# Patient Record
Sex: Female | Born: 1985 | Race: Black or African American | Hispanic: No | Marital: Married | Smoking: Current every day smoker
Health system: Southern US, Community
[De-identification: ages and names within clinical notes are randomized; demographics above are authoritative.]

## PROBLEM LIST (undated history)

## (undated) ENCOUNTER — Inpatient Hospital Stay (HOSPITAL_COMMUNITY): Payer: Self-pay

## (undated) DIAGNOSIS — E119 Type 2 diabetes mellitus without complications: Secondary | ICD-10-CM

## (undated) DIAGNOSIS — Z87898 Personal history of other specified conditions: Secondary | ICD-10-CM

## (undated) DIAGNOSIS — Z8742 Personal history of other diseases of the female genital tract: Secondary | ICD-10-CM

## (undated) DIAGNOSIS — Z8759 Personal history of other complications of pregnancy, childbirth and the puerperium: Secondary | ICD-10-CM

## (undated) DIAGNOSIS — G5603 Carpal tunnel syndrome, bilateral upper limbs: Secondary | ICD-10-CM

---
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
## 2005-12-22 ENCOUNTER — Inpatient Hospital Stay (HOSPITAL_COMMUNITY): Admission: AD | Admit: 2005-12-22 | Discharge: 2005-12-23 | Payer: Self-pay | Admitting: Obstetrics & Gynecology

## 2006-01-12 ENCOUNTER — Inpatient Hospital Stay (HOSPITAL_COMMUNITY): Admission: AD | Admit: 2006-01-12 | Discharge: 2006-01-12 | Payer: Self-pay | Admitting: Obstetrics and Gynecology

## 2006-01-13 ENCOUNTER — Inpatient Hospital Stay (HOSPITAL_COMMUNITY): Admission: AD | Admit: 2006-01-13 | Discharge: 2006-01-15 | Payer: Self-pay | Admitting: Gynecology

## 2006-01-13 ENCOUNTER — Ambulatory Visit: Payer: Self-pay | Admitting: *Deleted

## 2006-01-25 ENCOUNTER — Ambulatory Visit: Payer: Self-pay | Admitting: Family Medicine

## 2006-01-26 ENCOUNTER — Ambulatory Visit (HOSPITAL_COMMUNITY): Admission: RE | Admit: 2006-01-26 | Discharge: 2006-01-26 | Payer: Self-pay | Admitting: *Deleted

## 2006-04-01 ENCOUNTER — Inpatient Hospital Stay (HOSPITAL_COMMUNITY): Admission: AD | Admit: 2006-04-01 | Discharge: 2006-04-01 | Payer: Self-pay | Admitting: Obstetrics & Gynecology

## 2006-04-01 ENCOUNTER — Ambulatory Visit: Payer: Self-pay | Admitting: Family Medicine

## 2006-04-08 ENCOUNTER — Ambulatory Visit: Payer: Self-pay | Admitting: Family Medicine

## 2006-04-12 ENCOUNTER — Observation Stay (HOSPITAL_COMMUNITY): Admission: AD | Admit: 2006-04-12 | Discharge: 2006-04-12 | Payer: Self-pay | Admitting: Gynecology

## 2006-04-12 ENCOUNTER — Ambulatory Visit: Payer: Self-pay | Admitting: Obstetrics & Gynecology

## 2006-04-14 ENCOUNTER — Inpatient Hospital Stay (HOSPITAL_COMMUNITY): Admission: AD | Admit: 2006-04-14 | Discharge: 2006-04-16 | Payer: Self-pay | Admitting: Gynecology

## 2006-04-14 ENCOUNTER — Ambulatory Visit: Payer: Self-pay | Admitting: Certified Nurse Midwife

## 2006-04-19 ENCOUNTER — Ambulatory Visit: Payer: Self-pay | Admitting: Gynecology

## 2006-04-23 ENCOUNTER — Inpatient Hospital Stay (HOSPITAL_COMMUNITY): Admission: AD | Admit: 2006-04-23 | Discharge: 2006-04-24 | Payer: Self-pay | Admitting: Family Medicine

## 2006-04-23 ENCOUNTER — Ambulatory Visit: Payer: Self-pay | Admitting: *Deleted

## 2006-04-26 ENCOUNTER — Ambulatory Visit: Payer: Self-pay | Admitting: Obstetrics & Gynecology

## 2006-05-03 ENCOUNTER — Ambulatory Visit: Payer: Self-pay | Admitting: Gynecology

## 2006-05-03 ENCOUNTER — Ambulatory Visit (HOSPITAL_COMMUNITY): Admission: RE | Admit: 2006-05-03 | Discharge: 2006-05-03 | Payer: Self-pay | Admitting: Family Medicine

## 2006-05-17 ENCOUNTER — Ambulatory Visit: Payer: Self-pay | Admitting: Obstetrics & Gynecology

## 2006-05-21 ENCOUNTER — Ambulatory Visit: Payer: Self-pay | Admitting: Obstetrics & Gynecology

## 2006-05-24 ENCOUNTER — Ambulatory Visit (HOSPITAL_COMMUNITY): Admission: RE | Admit: 2006-05-24 | Discharge: 2006-05-24 | Payer: Self-pay | Admitting: Obstetrics and Gynecology

## 2006-05-24 ENCOUNTER — Ambulatory Visit: Payer: Self-pay | Admitting: Gynecology

## 2006-05-25 ENCOUNTER — Inpatient Hospital Stay (HOSPITAL_COMMUNITY): Admission: RE | Admit: 2006-05-25 | Discharge: 2006-05-29 | Payer: Self-pay | Admitting: Gynecology

## 2006-05-25 ENCOUNTER — Ambulatory Visit: Payer: Self-pay | Admitting: Gynecology

## 2006-06-04 ENCOUNTER — Ambulatory Visit: Payer: Self-pay | Admitting: Family Medicine

## 2006-06-04 ENCOUNTER — Inpatient Hospital Stay (HOSPITAL_COMMUNITY): Admission: AD | Admit: 2006-06-04 | Discharge: 2006-06-04 | Payer: Self-pay | Admitting: Obstetrics & Gynecology

## 2007-07-28 ENCOUNTER — Other Ambulatory Visit: Payer: Self-pay

## 2007-07-28 ENCOUNTER — Inpatient Hospital Stay (HOSPITAL_COMMUNITY): Admission: AD | Admit: 2007-07-28 | Discharge: 2007-07-31 | Payer: Self-pay | Admitting: *Deleted

## 2007-07-28 ENCOUNTER — Ambulatory Visit: Payer: Self-pay | Admitting: *Deleted

## 2007-11-10 HISTORY — PX: WISDOM TOOTH EXTRACTION: SHX21

## 2007-12-05 ENCOUNTER — Ambulatory Visit: Payer: Self-pay | Admitting: Family Medicine

## 2007-12-22 ENCOUNTER — Ambulatory Visit: Payer: Self-pay | Admitting: Internal Medicine

## 2007-12-22 LAB — CONVERTED CEMR LAB
ALT: 20 units/L (ref 0–35)
Alkaline Phosphatase: 57 units/L (ref 39–117)
Basophils Absolute: 0 10*3/uL (ref 0.0–0.1)
Cholesterol: 195 mg/dL (ref 0–200)
Creatinine, Ser: 0.75 mg/dL (ref 0.40–1.20)
Eosinophils Absolute: 0.1 10*3/uL (ref 0.0–0.7)
Eosinophils Relative: 2 % (ref 0–5)
HCT: 40 % (ref 36.0–46.0)
Hemoglobin: 13.1 g/dL (ref 12.0–15.0)
LDL Cholesterol: 116 mg/dL — ABNORMAL HIGH (ref 0–99)
MCHC: 32.8 g/dL (ref 30.0–36.0)
MCV: 82 fL (ref 78.0–100.0)
Monocytes Absolute: 0.5 10*3/uL (ref 0.1–1.0)
Platelets: 255 10*3/uL (ref 150–400)
RDW: 14.3 % (ref 11.5–15.5)
Sodium: 139 meq/L (ref 135–145)
Total Bilirubin: 0.4 mg/dL (ref 0.3–1.2)
Total CHOL/HDL Ratio: 3.4
Total Protein: 7.6 g/dL (ref 6.0–8.3)
Triglycerides: 111 mg/dL (ref <150)
VLDL: 22 mg/dL (ref 0–40)

## 2008-02-01 ENCOUNTER — Encounter: Payer: Self-pay | Admitting: Family Medicine

## 2008-02-01 ENCOUNTER — Ambulatory Visit: Payer: Self-pay | Admitting: Internal Medicine

## 2008-02-01 LAB — CONVERTED CEMR LAB: Chlamydia, DNA Probe: NEGATIVE

## 2009-08-26 ENCOUNTER — Encounter (INDEPENDENT_AMBULATORY_CARE_PROVIDER_SITE_OTHER): Payer: Self-pay | Admitting: Family Medicine

## 2009-08-26 ENCOUNTER — Ambulatory Visit: Payer: Self-pay | Admitting: Internal Medicine

## 2009-08-26 LAB — CONVERTED CEMR LAB
Alkaline Phosphatase: 84 units/L (ref 39–117)
BUN: 11 mg/dL (ref 6–23)
Basophils Absolute: 0 10*3/uL (ref 0.0–0.1)
Basophils Relative: 0 % (ref 0–1)
CO2: 23 meq/L (ref 19–32)
Cholesterol: 249 mg/dL — ABNORMAL HIGH (ref 0–200)
Creatinine, Ser: 0.87 mg/dL (ref 0.40–1.20)
Eosinophils Absolute: 0.1 10*3/uL (ref 0.0–0.7)
Eosinophils Relative: 1 % (ref 0–5)
Glucose, Bld: 345 mg/dL — ABNORMAL HIGH (ref 70–99)
HDL: 47 mg/dL (ref >39)
Hemoglobin: 12.7 g/dL (ref 12.0–15.0)
LDL Cholesterol: 172 mg/dL — ABNORMAL HIGH (ref 0–99)
MCHC: 31.4 g/dL (ref 30.0–36.0)
MCV: 81 fL (ref 78.0–100.0)
Monocytes Absolute: 0.5 10*3/uL (ref 0.1–1.0)
Monocytes Relative: 8 % (ref 3–12)
Neutro Abs: 3.2 10*3/uL (ref 1.7–7.7)
RBC: 4.99 M/uL (ref 3.87–5.11)
RDW: 14.1 % (ref 11.5–15.5)
Sodium: 138 meq/L (ref 135–145)
Total Bilirubin: 0.2 mg/dL — ABNORMAL LOW (ref 0.3–1.2)
Total CHOL/HDL Ratio: 5.3
Total Protein: 7.6 g/dL (ref 6.0–8.3)
Triglycerides: 149 mg/dL (ref <150)
VLDL: 30 mg/dL (ref 0–40)

## 2009-09-09 ENCOUNTER — Ambulatory Visit: Payer: Self-pay | Admitting: Internal Medicine

## 2009-09-16 ENCOUNTER — Ambulatory Visit: Payer: Self-pay | Admitting: Family Medicine

## 2009-10-19 ENCOUNTER — Emergency Department (HOSPITAL_COMMUNITY): Admission: EM | Admit: 2009-10-19 | Discharge: 2009-10-19 | Payer: Self-pay | Admitting: Emergency Medicine

## 2009-12-02 ENCOUNTER — Ambulatory Visit: Payer: Self-pay | Admitting: Internal Medicine

## 2009-12-30 ENCOUNTER — Ambulatory Visit: Payer: Self-pay | Admitting: Internal Medicine

## 2010-01-14 ENCOUNTER — Ambulatory Visit: Payer: Self-pay | Admitting: Internal Medicine

## 2010-01-14 LAB — CONVERTED CEMR LAB
BUN: 11 mg/dL (ref 6–23)
CO2: 23 meq/L (ref 19–32)
CRP: 1.3 mg/dL — ABNORMAL HIGH (ref <0.6)
Calcium: 8.6 mg/dL (ref 8.4–10.5)
Cholesterol: 237 mg/dL — ABNORMAL HIGH (ref 0–200)
Glucose, Bld: 278 mg/dL — ABNORMAL HIGH (ref 70–99)
Hgb A1c MFr Bld: 13.9 % — ABNORMAL HIGH (ref 4.6–6.1)
Sodium: 137 meq/L (ref 135–145)
TSH: 2.123 microintl units/mL (ref 0.350–4.500)
Total CHOL/HDL Ratio: 5.4

## 2010-01-20 ENCOUNTER — Ambulatory Visit: Payer: Self-pay | Admitting: Internal Medicine

## 2010-08-10 ENCOUNTER — Emergency Department (HOSPITAL_COMMUNITY): Admission: EM | Admit: 2010-08-10 | Discharge: 2010-08-10 | Payer: Self-pay | Admitting: Emergency Medicine

## 2010-09-16 ENCOUNTER — Emergency Department (HOSPITAL_COMMUNITY): Admission: EM | Admit: 2010-09-16 | Discharge: 2010-09-16 | Payer: Self-pay | Admitting: Emergency Medicine

## 2010-11-30 ENCOUNTER — Encounter: Payer: Self-pay | Admitting: *Deleted

## 2011-01-22 LAB — DIFFERENTIAL
Basophils Relative: 0 % (ref 0–1)
Eosinophils Absolute: 0.1 10*3/uL (ref 0.0–0.7)
Lymphs Abs: 2.3 10*3/uL (ref 0.7–4.0)
Neutro Abs: 5.2 10*3/uL (ref 1.7–7.7)
Neutrophils Relative %: 65 % (ref 43–77)

## 2011-01-22 LAB — POCT I-STAT, CHEM 8
HCT: 41 % (ref 36.0–46.0)
Hemoglobin: 13.9 g/dL (ref 12.0–15.0)
Potassium: 3.5 mEq/L (ref 3.5–5.1)
Sodium: 139 mEq/L (ref 135–145)
TCO2: 23 mmol/L (ref 0–100)

## 2011-01-22 LAB — D-DIMER, QUANTITATIVE: D-Dimer, Quant: 0.6 ug/mL-FEU — ABNORMAL HIGH (ref 0.00–0.48)

## 2011-01-22 LAB — CBC
MCH: 27.2 pg (ref 26.0–34.0)
MCHC: 33 g/dL (ref 30.0–36.0)
Platelets: 255 10*3/uL (ref 150–400)
RBC: 4.57 MIL/uL (ref 3.87–5.11)

## 2011-02-10 LAB — URINALYSIS, ROUTINE W REFLEX MICROSCOPIC
Bilirubin Urine: NEGATIVE
Hgb urine dipstick: NEGATIVE
Ketones, ur: NEGATIVE mg/dL
Protein, ur: NEGATIVE mg/dL
Specific Gravity, Urine: 1.038 — ABNORMAL HIGH (ref 1.005–1.030)
Urobilinogen, UA: 0.2 mg/dL (ref 0.0–1.0)

## 2011-02-10 LAB — BASIC METABOLIC PANEL
BUN: 12 mg/dL (ref 6–23)
Calcium: 9.1 mg/dL (ref 8.4–10.5)
Creatinine, Ser: 0.73 mg/dL (ref 0.4–1.2)
GFR calc non Af Amer: >60 mL/min (ref >60)
Glucose, Bld: 382 mg/dL — ABNORMAL HIGH (ref 70–99)

## 2011-02-10 LAB — GLUCOSE, CAPILLARY: Glucose-Capillary: 375 mg/dL — ABNORMAL HIGH (ref 70–99)

## 2011-02-10 LAB — URINE MICROSCOPIC-ADD ON

## 2011-03-17 ENCOUNTER — Emergency Department (HOSPITAL_COMMUNITY)
Admission: EM | Admit: 2011-03-17 | Discharge: 2011-03-17 | Discharge status: Against Medical Advice | Payer: Medicaid Other | Attending: Emergency Medicine | Admitting: Emergency Medicine

## 2011-03-17 DIAGNOSIS — Z794 Long term (current) use of insulin: Secondary | ICD-10-CM | POA: Insufficient documentation

## 2011-03-17 DIAGNOSIS — R112 Nausea with vomiting, unspecified: Secondary | ICD-10-CM | POA: Insufficient documentation

## 2011-03-17 DIAGNOSIS — R5381 Other malaise: Secondary | ICD-10-CM | POA: Insufficient documentation

## 2011-03-17 DIAGNOSIS — Z79899 Other long term (current) drug therapy: Secondary | ICD-10-CM | POA: Insufficient documentation

## 2011-03-17 DIAGNOSIS — E1169 Type 2 diabetes mellitus with other specified complication: Secondary | ICD-10-CM | POA: Insufficient documentation
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
## 2011-03-27 NOTE — Discharge Summary (Signed)
NAME:  Kathryn Mayo, MCCONAUGHEY NO.:  [id number]   MEDICAL RECORD NO.:  [id number]           PATIENT TYPE:   LOCATION:                                 FACILITY:   PHYSICIAN:  Ginger Carne, MD       DATE OF BIRTH:   DATE OF ADMISSION:  05/25/2006  DATE OF DISCHARGE:  05/29/2006                                 DISCHARGE SUMMARY   ADMISSION DIAGNOSES:  1.  Intrauterine pregnancy at term.  2.  Gestational diabetes requiring insulin.  3.  Desires repeat C-section.   DISCHARGE DIAGNOSES:  1.  Delivered pregnancy at term.  The C-section was done by Dr. Mayford Knife on      July 18 with Dr. Mia Creek.  The patient received spinal anesthesia, had      1000 mL blood loss.  The patient had a viable female, Apgars 8 and 9, 8      pounds 9 ounces.  2.  Gestational diabetes and diabetes type 2.  3.  History of normal Pap.   HOSPITAL COURSE:  The patient is a 25 year old female who was admitted in  labor on May 25, 2006.  The patient did have a deceleration for 3 minutes  with heart rate at 80.  However, the strip became quickly reassuring in less  than 3 minutes.  The patient was taken to the OR for a repeat C-section on  July 18 with Dr. Mia Creek and Dr. Mayford Knife as above.  Estimated blood loss  was 1000 mL.   On postoperative day 1, she began doing well.  Her blood sugars were 183 and  104.  Her blood sugars continued to remain in the less than 200 range and at  discharge her last blood sugar was 112.  Here in the hospital, she was kept  on glyburide 5 mg p.o. b.i.d. but at discharge given concern that the  patient intermittently takes her medication, it was decided that metformin  would be safer for her.  This MD sat down and talked with her extensively  about her diabetic regimen.  She will continue taking NovoLog 10 units  q.a.c. t.i.d. but then will switch her to metformin 500 mg p.o. for a week  and then 1000 mg p.o. at bedtime for another week.  Side effects were  explained including diarrhea.  The patient is going to follow up with Dr.  Alanda Amass at the [facility] for this problem.  On day 3,  the patient was doing well.  Her incision was clean, dry and intact.  Her  exam was within normal limits.  She expressed understanding of her  medication regimen.   DISCHARGE MEDICATIONS:  1.  Percocet 1-2 tablets p.o. q.4 p.r.n.  2.  Ibuprofen 600 mg q.6 p.r.n.  3.  Colace 100 mg p.o. b.i.d. as needed for constipation.  4.  Prenatal vitamins one p.o. daily.  5.  NovoLog 10 units q.a.c. t.i.d.  This is a new dose.  6.  Metformin 500 mg at night for one week and then 1000 mg at night from  then on.  7.  Stop your NPH.   FOLLOW-UP CARE:  The patient will return to [facility] in six weeks.  She also has an appointment on August 2 at 1:30 p.m. with Dr. Melynda Ripple at  [facility].  She will return to [facility]U on Monday, July 23 for staple  removal.  MD discussed these appointments with the patient as the patient is  aware of these dates.   DISCHARGE LABORATORY DATA:  The patient received RhoGAM on July 19.  Her CBC  on July 19 white blood cells 7.2, H&H 10.9/32.3, platelets 240.   The patient was stable during her entire hospital stay and was discharged in  improved condition.  The patient was a full code for entire hospital stay.      Rolm Gala, M.D.      Ginger Carne, MD  Electronically Signed    HG/MEDQ  D:  05/29/2006  T:  05/29/2006  Job:  [id number]
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
## 2011-03-27 NOTE — Op Note (Signed)
NAME:  Kathryn Mayo, Kathryn Mayo NO.:  [id number]   MEDICAL RECORD NO.:  [id number]          PATIENT TYPE:  INP   LOCATION:  WOC                           FACILITY:  [facility]   PHYSICIAN:  Ginger Carne, MD  DATE OF BIRTH:  09-06-1986   DATE OF PROCEDURE:  05/26/2006  DATE OF DISCHARGE:                                 OPERATIVE REPORT   PREOPERATIVE DIAGNOSIS:  1.  36-week intrauterine pregnancy.  2.  Gestational diabetes, A2/B.  3.  History of intrauterine fetal demise.  4.  History of previous cesarean section and desire for repeat.  5.  Fetal lung maturity based on amniocentesis with positive PG.   POSTOPERATIVE DIAGNOSIS:  1.  36-week intrauterine pregnancy.  2.  Gestational diabetes, A2/B.  3.  History of intrauterine fetal demise.  4.  History of previous cesarean section and desire for repeat.  5.  Fetal lung maturity based on amniocentesis with positive PG.   PROCEDURE:  Repeat low transverse cesarean section via Pfannenstiel.   SURGEON:  Ginger Carne, M.D.   ASSISTANT:  Marc Morgans. Mayford Knife, M.D.   ESTIMATED BLOOD LOSS:  1 liter.   COMPLICATIONS:  None.   IV FLUIDS:  3100 mL.   URINE OUTPUT:  100 mL.   ANESTHESIA:  Spinal.   SPECIMENS:  Placenta to labor and delivery.  Cord blood.   OPERATIVE FINDINGS:  Female infant in the vertex presentation, Apgars 8/9,  weight 8 pounds 9 ounces.  Baby cried spontaneously at delivery.  Amniotic  fluid was clear.  The placenta was complete with a three cord vessel and  central insertion.  The uterus, tubes and ovaries showed normal decidual  changes of pregnancy.   OPERATIVE PROCEDURE:  The patient was taken to the OR where spinal  anesthesia was placed and found to be adequate.  She was then prepped and  draped in a normal sterile fashion in the dorsal supine position with a  leftward tilt.  A Pfannenstiel skin incision was made and the abdomen  opened.  The lower uterine segment was incised transversely.   The baby  delivered atraumatically.  Baby bulb suctioned.  Cord clamped and cut.  Infant handed off to waiting neonatologist.  The placenta was removed  manually.  The uterus was exteriorized.  The uterus was closed in a one  layer fashion with 0 Vicryl.  Bleeding points hemostatically checked.  Blood  clots removed.  There was noted to be some bleeding on the fundus of the  uterus which was stopped with the cautery and figure-of-eight stitch.  The  uterus was returned to the abdomen.  The fascia was closed with 0 Vicryl in  a running fashion.  The skin was closed with staples.  Instrument, sponge  and needle counts were correct x2.  The patient had 2 grams of Ancef.  The  patient tolerated procedure well and was taken to recovery room in excellent  condition.     ______________________________  Marc Morgans Mayford Knife, M.D.      Ginger Carne, MD  Electronically Signed    TLW/MEDQ  D:  05/26/2006  T:  05/26/2006  Job:  [id number]

## 2011-03-27 NOTE — Discharge Summary (Signed)
NAME:  Kathryn Mayo, Kathryn Mayo NO.:  [id number]   MEDICAL RECORD NO.:  [id number]          PATIENT TYPE:  INP   LOCATION:  9157                          [facility]ILITY:  [facility]   PHYSICIAN:  Angie B. Merlene Morse, MD  DATE OF BIRTH:  03-10-1986   DATE OF ADMISSION:  04/14/2006  DATE OF DISCHARGE:  04/16/2006                                 DISCHARGE SUMMARY   DISCHARGE PHYSICIAN:  Lesly Dukes, M.D.   ADMITTING DIAGNOSES:  1.  A 30-week and 6/7th day intrauterine pregnancy.  2.  Diabetes out of control.   DISCHARGE DIAGNOSES:  1.  A 30-week and 6/7th day intrauterine pregnancy.  2.  Diabetes out of control.   ADMITTING HISTORY PHYSICAL:  The patient is a 25 year old G4, P2-0-1-1,  admitted because of uncontrolled blood sugars.  She had a blood sugar of 320  in [facility], noncompliant with her appointments.   OBSTETRICAL HISTORY:  Significant for:  1.  A 37 week IUFD.  2.  Termination at 4 weeks.  3.  Primary C-section at term.   MEDICATIONS:  1.  Glyburide b.i.d.  2.  Prenatal vitamins.   ALLERGIES:  None.   Tobacco:  Half a pack per day.   PHYSICAL EXAMINATION:  VITAL SIGNS:  Afebrile, vital signs stable.  Weight  is 98 kg.  HEENT:  Normal.  CHEST:  Clear.  CARDIOVASCULAR:  No murmurs, gallops, or rubs.  ABDOMEN:  32-cm.   HOSPITAL COURSE:  The patient was admitted.  She was started on insulin  which was adjusted throughout the hospital stay to obtain better glycemic  control.  The patient did have a nutrition consult and the patient was  instructed on how to give herself insulin and check her blood sugars.  The  patient also had a social work consult done secondary to multiple social  issues.  She did receive RhoGAM this hospitalization.   LABORATORY:  She had a 24-hour urine done that was pending at the time of  discharge.  She had a CMP which was normal, except a glucose of 241.  She  had a CBC done.  It showed a hemoglobin 11.1 and hem 33.0, platelets 201.  UA  was negative except glucose greater than 1,000.  She had a 24-hour urine  that was pending at the time of discharge.   DISCHARGE INSTRUCTIONS:  The patient was discharged to home.  She was to  follow up at the high risk clinic as already scheduled on Monday at 10:30.  The  patient is to record her blood sugars and bring them to her clinic  appointment on Monday.   DISCHARGE MEDICATIONS:  1.  NPH 50 units subcutaneous q.a.m. and 35 units subcutaneous q.h.s.  2.  NovoLog 15 units with breakfast, lunch, and dinner.           ______________________________  August Saucer. Merlene Morse, MD     ABC/MEDQ  D:  04/16/2006  T:  04/16/2006  Job:  [id number]

## 2011-08-20 LAB — DIFFERENTIAL
Basophils Relative: 1
Eosinophils Relative: 2
Monocytes Absolute: 0.5
Monocytes Relative: 8
Neutro Abs: 3.3

## 2011-08-20 LAB — BASIC METABOLIC PANEL
CO2: 26
Calcium: 9.3
Chloride: 107
GFR calc Af Amer: >60
Glucose, Bld: 85
Potassium: 4.3
Sodium: 141

## 2011-08-20 LAB — RAPID URINE DRUG SCREEN, HOSP PERFORMED
Amphetamines: NOT DETECTED
Cocaine: NOT DETECTED
Opiates: NOT DETECTED
Tetrahydrocannabinol: POSITIVE — AB

## 2011-08-20 LAB — CBC
HCT: 39.7
Hemoglobin: 13.2
MCHC: 33.3
RBC: 4.9

## 2011-08-20 LAB — PREGNANCY, URINE: Preg Test, Ur: NEGATIVE

## 2011-08-20 LAB — URINALYSIS, ROUTINE W REFLEX MICROSCOPIC
Bilirubin Urine: NEGATIVE
Nitrite: NEGATIVE
Protein, ur: 30 — AB
Urobilinogen, UA: 0.2

## 2011-08-20 LAB — URINE MICROSCOPIC-ADD ON

## 2012-09-14 ENCOUNTER — Emergency Department (HOSPITAL_COMMUNITY)
Admission: EM | Admit: 2012-09-14 | Discharge: 2012-09-14 | Disposition: A | Discharge status: Home / Self Care | Payer: Medicare Other | Attending: Emergency Medicine | Admitting: Emergency Medicine

## 2012-09-14 ENCOUNTER — Encounter (HOSPITAL_COMMUNITY): Payer: Self-pay | Admitting: *Deleted

## 2012-09-14 DIAGNOSIS — R350 Frequency of micturition: Secondary | ICD-10-CM | POA: Insufficient documentation

## 2012-09-14 DIAGNOSIS — M545 Low back pain, unspecified: Secondary | ICD-10-CM | POA: Insufficient documentation

## 2012-09-14 DIAGNOSIS — F17200 Nicotine dependence, unspecified, uncomplicated: Secondary | ICD-10-CM | POA: Insufficient documentation

## 2012-09-14 LAB — URINALYSIS, ROUTINE W REFLEX MICROSCOPIC
Bilirubin Urine: NEGATIVE
Hgb urine dipstick: NEGATIVE
Ketones, ur: NEGATIVE mg/dL
Specific Gravity, Urine: 1.021 (ref 1.005–1.030)
Urobilinogen, UA: 0.2 mg/dL (ref 0.0–1.0)
pH: 7.5 (ref 5.0–8.0)

## 2012-09-14 LAB — GLUCOSE, CAPILLARY: Glucose-Capillary: 101 mg/dL — ABNORMAL HIGH (ref 70–99)

## 2012-09-14 MED ORDER — HYDROCODONE-ACETAMINOPHEN 5-325 MG PO TABS: ORAL_TABLET | ORAL | Status: DC

## 2012-09-14 MED ORDER — METHOCARBAMOL 500 MG PO TABS: 1000.0000 mg | ORAL_TABLET | Freq: Four times a day (QID) | ORAL | Status: DC | PRN

## 2012-09-14 MED ORDER — NAPROXEN 250 MG PO TABS: 250.0000 mg | ORAL_TABLET | Freq: Two times a day (BID) | ORAL | Status: DC

## 2012-09-14 NOTE — ED Provider Notes (Signed)
History     CSN: [id number]  Arrival date & time 09/14/12  0919   First MD Initiated Contact with Patient 09/14/12 [phone]      Chief Complaint  Patient presents with  . Back Pain  . Urinary Frequency     HPI Pt was seen at 0935.  Per pt, c/o gradual onset and persistence of constant urinary frequency and urgency for the past 3-4 days.  Pt also c/o gradual onset and persistence of constant acute flair of her chronic low back "pain" for the past 3 days.  Denies any change in her usual chronic pain pattern.  Pain worsens with palpation of the area and body position changes. Denies incont/retention of bowel or bladder, no saddle anesthesia, no focal motor weakness, no tingling/numbness in extremities, no fevers, no injury, no abd pain. Denies vaginal bleeding/discharge, no N/V/D, no rash.     Past Medical History  Diagnosis Date  . Diabetes mellitus without complication     Past Surgical History  Procedure Date  . Cesarean section     History  Substance Use Topics  . Smoking status: Current Every Day Smoker  . Smokeless tobacco: Not on file  . Alcohol Use: Yes    Review of Systems ROS: Statement: All systems negative except as marked or noted in the HPI; Constitutional: Negative for fever and chills. ; ; Eyes: Negative for eye pain, redness and discharge. ; ; ENMT: Negative for ear pain, hoarseness, nasal congestion, sinus pressure and sore throat. ; ; Cardiovascular: Negative for chest pain, palpitations, diaphoresis, dyspnea and peripheral edema. ; ; Respiratory: Negative for cough, wheezing and stridor. ; ; Gastrointestinal: Negative for nausea, vomiting, diarrhea, abdominal pain, blood in stool, hematemesis, jaundice and rectal bleeding. . ; ; Genitourinary: +urinary frequency. Negative for dysuria, flank pain and hematuria. ; ; GYN:  No vaginal bleeding, no vaginal discharge, no vulvar pain.;; Musculoskeletal: +LBP. Negative for neck pain. Negative for swelling and trauma.; ;  Skin: Negative for pruritus, rash, abrasions, blisters, bruising and skin lesion.; ; Neuro: Negative for headache, lightheadedness and neck stiffness. Negative for weakness, altered level of consciousness , altered mental status, extremity weakness, paresthesias, involuntary movement, seizure and syncope.     Allergies  Review of patient's allergies indicates no known allergies.  Home Medications  No current outpatient prescriptions on file.  BP 109/55  Pulse 83  Temp 98.1 F (36.7 C) (Oral)  Resp 16  SpO2 100%  LMP 08/21/2012  Physical Exam 0940: Physical examination:  Nursing notes reviewed; Vital signs and O2 SAT reviewed;  Constitutional: Well developed, Well nourished, Well hydrated, In no acute distress; Head:  Normocephalic, atraumatic; Eyes: EOMI, PERRL, No scleral icterus; ENMT: Mouth and pharynx normal, Mucous membranes moist; Neck: Supple, Full range of motion, No lymphadenopathy; Cardiovascular: Regular rate and rhythm, No murmur, rub, or gallop; Respiratory: Breath sounds clear & equal bilaterally, No rales, rhonchi, wheezes.  Speaking full sentences with ease, Normal respiratory effort/excursion; Chest: Nontender, Movement normal; Abdomen: Soft, Nontender, Nondistended, Normal bowel sounds; Genitourinary: No CVA tenderness; Spine:  No midline CS, TS, LS tenderness.  +TTP right lumbar paraspinal muscles;; Extremities: Pulses normal, No tenderness, No edema, No calf edema or asymmetry.; Neuro: AA&Ox3, Major CN grossly intact.  Speech clear. Gait steady. Strength 5/5 equal bilat UE's and LE's, including great toe dorsiflexion.  DTR 2/4 equal bilat UE's and LE's.  No gross sensory deficits.  Neg straight leg raises bilat. Gait steady.;.; Skin: Color normal, Warm, Dry.   ED Course  Procedures    MDM  MDM Reviewed: nursing note and vitals Interpretation: labs     Results for orders placed during the hospital encounter of 09/14/12  URINALYSIS, ROUTINE W REFLEX MICROSCOPIC       Component Value Range   Color, Urine YELLOW  YELLOW   APPearance CLOUDY (*) CLEAR   Specific Gravity, Urine 1.021  1.005 - 1.030   pH 7.5  5.0 - 8.0   Glucose, UA NEGATIVE  NEGATIVE mg/dL   Hgb urine dipstick NEGATIVE  NEGATIVE   Bilirubin Urine NEGATIVE  NEGATIVE   Ketones, ur NEGATIVE  NEGATIVE mg/dL   Protein, ur NEGATIVE  NEGATIVE mg/dL   Urobilinogen, UA 0.2  0.0 - 1.0 mg/dL   Nitrite NEGATIVE  NEGATIVE   Leukocytes, UA NEGATIVE  NEGATIVE  PREGNANCY, URINE      Component Value Range   Preg Test, Ur NEGATIVE  NEGATIVE  GLUCOSE, CAPILLARY      Component Value Range   Glucose-Capillary 101 (*) 70 - 99 mg/dL     4098:  Will tx back pain symptomatically. No UTI. Dx and testing d/w pt.  Questions answered.  Verb understanding, agreeable to d/c home with outpt f/u.          Laray Anger, DO 09/16/12 1307

## 2012-09-14 NOTE — ED Notes (Signed)
Several days of urinary frequency and low back pain

## 2012-09-14 NOTE — ED Notes (Signed)
Pt up ambulating without difficulty.  Pt agitated at discharge.  Verbally abusive to staff and making threats.  Pt escorted out of ED by [employer].  Charge Diane RN Rolly Salter RN witnessed to pt's behavior.  Discharge instructions with RX x 3 given to pt- Pt refused to sign.

## 2012-09-14 NOTE — ED Notes (Signed)
cbg 101 

## 2012-09-14 NOTE — ED Notes (Signed)
Pt reports chronic back pain that radiates pain up her spine and frequency with urination.

## 2013-05-06 ENCOUNTER — Inpatient Hospital Stay (EMERGENCY_DEPARTMENT_HOSPITAL)
Admission: AD | Admit: 2013-05-06 | Discharge: 2013-05-06 | Disposition: A | Discharge status: Home / Self Care | Payer: Medicare Other | Source: Ambulatory Visit | Attending: Obstetrics and Gynecology | Admitting: Obstetrics and Gynecology

## 2013-05-06 ENCOUNTER — Encounter (HOSPITAL_COMMUNITY): Payer: Self-pay | Admitting: Obstetrics and Gynecology

## 2013-05-06 ENCOUNTER — Encounter (HOSPITAL_COMMUNITY): Payer: Self-pay | Admitting: *Deleted

## 2013-05-06 ENCOUNTER — Inpatient Hospital Stay (HOSPITAL_COMMUNITY): Payer: Medicare Other

## 2013-05-06 ENCOUNTER — Inpatient Hospital Stay (HOSPITAL_COMMUNITY)
Admission: AD | Admit: 2013-05-06 | Discharge: 2013-05-06 | Disposition: A | Discharge status: Home / Self Care | Payer: Medicare Other | Source: Ambulatory Visit | Attending: Obstetrics & Gynecology | Admitting: Obstetrics & Gynecology

## 2013-05-06 DIAGNOSIS — O4690 Antepartum hemorrhage, unspecified, unspecified trimester: Secondary | ICD-10-CM

## 2013-05-06 DIAGNOSIS — O209 Hemorrhage in early pregnancy, unspecified: Secondary | ICD-10-CM

## 2013-05-06 DIAGNOSIS — E1142 Type 2 diabetes mellitus with diabetic polyneuropathy: Secondary | ICD-10-CM | POA: Insufficient documentation

## 2013-05-06 DIAGNOSIS — E1049 Type 1 diabetes mellitus with other diabetic neurological complication: Secondary | ICD-10-CM | POA: Insufficient documentation

## 2013-05-06 DIAGNOSIS — O24919 Unspecified diabetes mellitus in pregnancy, unspecified trimester: Secondary | ICD-10-CM | POA: Insufficient documentation

## 2013-05-06 DIAGNOSIS — O200 Threatened abortion: Secondary | ICD-10-CM | POA: Insufficient documentation

## 2013-05-06 LAB — URINALYSIS, ROUTINE W REFLEX MICROSCOPIC
Leukocytes, UA: NEGATIVE
Nitrite: NEGATIVE
Specific Gravity, Urine: 1.02 (ref 1.005–1.030)
pH: 6.5 (ref 5.0–8.0)

## 2013-05-06 LAB — HCG, QUANTITATIVE, PREGNANCY: hCG, Beta Chain, Quant, S: 15974 m[IU]/mL — ABNORMAL HIGH (ref <5)

## 2013-05-06 LAB — CBC
HCT: 34.1 % — ABNORMAL LOW (ref 36.0–46.0)
Hemoglobin: 11.9 g/dL — ABNORMAL LOW (ref 12.0–15.0)
MCV: 78.8 fL (ref 78.0–100.0)
Platelets: 246 10*3/uL (ref 150–400)
RBC: 4.33 MIL/uL (ref 3.87–5.11)
WBC: 10.4 10*3/uL (ref 4.0–10.5)

## 2013-05-06 LAB — ABO/RH: ABO/RH(D): B NEG

## 2013-05-06 LAB — URINE MICROSCOPIC-ADD ON

## 2013-05-06 LAB — GLUCOSE, CAPILLARY

## 2013-05-06 MED ORDER — MENTHOL 3 MG MT LOZG
1.0000 | LOZENGE | OROMUCOSAL | Status: DC | PRN
  Administered 2013-05-06: 3 mg via ORAL
  Filled 2013-05-06: qty 9

## 2013-05-06 MED ORDER — RHO D IMMUNE GLOBULIN 1500 UNIT/2ML IJ SOLN
50.0000 ug | Freq: Once | INTRAMUSCULAR | Status: DC
  Filled 2013-05-06: qty 0.33

## 2013-05-06 MED ORDER — PRENATAL VITAMINS 0.8 MG PO TABS: 1.0000 | ORAL_TABLET | Freq: Every day | ORAL | Status: DC

## 2013-05-06 MED ORDER — ONDANSETRON 4 MG PO TBDP: 4.0000 mg | ORAL_TABLET | Freq: Four times a day (QID) | ORAL | Status: DC | PRN

## 2013-05-06 MED ORDER — RHO D IMMUNE GLOBULIN 1500 UNIT/2ML IJ SOLN
300.0000 ug | Freq: Once | INTRAMUSCULAR | Status: AC
  Administered 2013-05-06: 300 ug via INTRAMUSCULAR
  Filled 2013-05-06: qty 2

## 2013-05-06 NOTE — MAU Provider Note (Signed)
  History     CSN: [id number]  Arrival date and time: 05/06/13 7829   First Provider Initiated Contact with Patient 05/06/13 1926      Chief Complaint  Patient presents with  . Vaginal Bleeding   HPI Kathryn Mayo 27 y.o. [redacted]w[redacted]d Was seen here yesterday with bleeding in pregnancy.  Had subchorionic hemorrhage and fetus seen on ultrasound.  No heart beat seen and impression was that this pregnancy may have been to early to see FHT.  Client continues to have some bleeding today and is worried about the pregnancy.  This is an unplanned but wanted pregnancy.  No worsening in the bleeding.  Having the same back pain as she had last night.  No worsening in lower abdominal cramping.  Plan was to have repeat ultrasound in 7 days. Has been 8 years since client's last pregnancy and she is worried about the bleeding so she returned to MAU.  OB History   Grav Para Term Preterm Abortions TAB SAB Ect Mult Living   5 3 2 1 1 1    2       Past Medical History  Diagnosis Date  . Diabetes mellitus without complication     Past Surgical History  Procedure Laterality Date  . Cesarean section      History reviewed. No pertinent family history.  History  Substance Use Topics  . Smoking status: Current Every Day Smoker -- 0.50 packs/day    Types: Cigarettes  . Smokeless tobacco: Not on file  . Alcohol Use: Yes     Comment: 05/05/2013    Allergies: No Known Allergies  Prescriptions prior to admission  Medication Sig Dispense Refill  . ondansetron (ZOFRAN ODT) 4 MG disintegrating tablet Take 1 tablet (4 mg total) by mouth every 6 (six) hours as needed for nausea.  20 tablet  0  . Prenatal Multivit-Min-Fe-FA (PRENATAL VITAMINS) 0.8 MG tablet Take 1 tablet by mouth daily.  30 tablet  12    Review of Systems  Constitutional: Negative for fever.  Gastrointestinal: Negative for nausea, vomiting and abdominal pain.  Genitourinary:       Vaginal bleeding  Musculoskeletal: Positive for back pain.    Physical Exam   Blood pressure 132/57, pulse 74, temperature 98.4 F (36.9 C), temperature source Oral, resp. rate 18, height 4\' 11"  (1.499 m), weight 190 lb 9.6 oz (86.456 kg), last menstrual period 03/18/2013.  Physical Exam  Nursing note and vitals reviewed. Constitutional: She is oriented to person, place, and time. She appears well-developed and well-nourished.  Client is worried about the pregnancy  HENT:  Head: Normocephalic.  Eyes: EOM are normal.  Neck: Neck supple.  Musculoskeletal: Normal range of motion.  Neurological: She is alert and oriented to person, place, and time.  Skin: Skin is warm and dry.  Psychiatric: She has a normal mood and affect.    MAU Course  Procedures  MDM Reviewed subchorionic hemorrhage and other ultrasound results from yesterday.  Reviewed signs of pregnancy loss.  Client more comfortable and will expect ultrasound to call to schedule follow up ultrasound.  Assessment and Plan  Bleeding in pregnancy  Plan Return if bleeding is severe or if client is having dizziness Expect ultrasound to call to schedule next ultrasound (due July 5). Begin prenatal care as soon as possible.  BURLESON,TERRI 05/06/2013, 7:27 PM

## 2013-05-06 NOTE — MAU Note (Signed)
Pt was here last night for vaginal bleeding. Stated bleeding is more today and having pain in her lower back.

## 2013-05-06 NOTE — MAU Note (Signed)
Pt reports +upt yesterday. Pt woke up this morning with some coughing and noticed that she was bleeding.

## 2013-05-06 NOTE — Progress Notes (Signed)
Dr Thad Ranger in earlier and discussed lab and u/s results.. Written and verbal d/c instructions given and understanding voiced

## 2013-05-06 NOTE — MAU Note (Signed)
Pt had some pink spotting on 6/8, but last normal LMP was 03/18/2013

## 2013-05-06 NOTE — MAU Provider Note (Signed)
History     CSN: [id number]  Arrival date and time: 05/06/13 0204   First Provider Initiated Contact with Patient 05/06/13 0242      No chief complaint on file.  HPI 27 y.o. [id number] at [redacted]w[redacted]d (by LMP 03/18/13) with vaginal bleeding tonight. Felt gush of blood with coughing. Mild low back pain/cramping. LMP 03/18/13, had one day of pink spotting in June. Periods regular prior to May, 5 days, no birth control. No dysuria. Some nausea/vomiting. No abdominal pain. Some vaginal discharge, white. No fever/chills. Does have cough/congestion. No diarrhea/constipation.  No prenatal care yet. Last medical visit in March 2014, states A1C was "good."  Not on any meds for diabetes. History of 8.5 month stillborn that weighed 10 lbs. No other medical problems. History of c-section x 2.  OB History   Grav Para Term Preterm Abortions TAB SAB Ect Mult Living   5 3 2 1 1 1    2       Past Medical History  Diagnosis Date  . 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 Cesarean section      No family history on file.  History  Substance Use Topics  . Smoking status: Current Every Day Smoker -- 0.50 packs/day    Types: Cigarettes  . Smokeless tobacco: Not on file  . Alcohol Use: Yes     Comment: 05/05/2013    Allergies: No Known Allergies  Prescriptions prior to admission  Medication Sig Dispense Refill  . HYDROcodone-acetaminophen (NORCO/VICODIN) 5-325 MG per tablet 1 or 2 tabs PO q6 hours prn pain  20 tablet  0  . methocarbamol (ROBAXIN) 500 MG tablet Take 2 tablets (1,000 mg total) by mouth 4 (four) times daily as needed (muscle spasm/pain).  25 tablet  0  . naproxen (NAPROSYN) 250 MG tablet Take 1 tablet (250 mg total) by mouth 2 (two) times daily with a meal.  14 tablet  0    ROS  See HPI  Physical Exam   Blood pressure 106/59, pulse 82, temperature 99 F (37.2 C), temperature source Oral, resp. rate 18, height 4\' 11"   (1.499 m), weight 86.183 kg (190 lb), last menstrual period 03/18/2013.  Physical Exam GEN:  WNWD, no distress HEENT:  NCAT, EOMI, conjunctiva clear CV: RRR, no murmur RESP:  CTAB ABD:  Soft, non-tender, no guarding or rebound, normal bowel sounds EXTREM:  Warm, well perfused, no edema or tenderness NEURO:  Alert, oriented, no focal deficits GU:  External genitalia normal, dried blood on pad and around introitus. Blood in vaginal vault. Minimal other discharge. Normal vagina. Normal, closed cervix (non-parous os), no CMT or adnexal tenderness.  Results for orders placed during the hospital encounter of 05/06/13 (from the past 24 hour(s))  URINALYSIS, ROUTINE W REFLEX MICROSCOPIC     Status: Abnormal   Collection Time    05/06/13  2:10 AM      Result Value Range   Color, Urine YELLOW  YELLOW   APPearance CLEAR  CLEAR   Specific Gravity, Urine 1.020  1.005 - 1.030   pH 6.5  5.0 - 8.0   Glucose, UA 100 (*) NEGATIVE mg/dL   Hgb urine dipstick LARGE (*) NEGATIVE   Bilirubin Urine NEGATIVE  NEGATIVE   Ketones, ur NEGATIVE  NEGATIVE mg/dL   Protein, ur NEGATIVE  NEGATIVE mg/dL   Urobilinogen, UA 4.0 (*) 0.0 - 1.0 mg/dL   Nitrite NEGATIVE  NEGATIVE   Leukocytes,  UA NEGATIVE  NEGATIVE  URINE MICROSCOPIC-ADD ON     Status: Abnormal   Collection Time    05/06/13  2:10 AM      Result Value Range   Squamous Epithelial / LPF RARE  RARE   WBC, UA    <3 WBC/hpf   Value: NO FORMED ELEMENTS SEEN ON URINE MICROSCOPIC EXAMINATION   RBC / HPF 11-20  <3 RBC/hpf   Bacteria, UA FEW (*) RARE  POCT PREGNANCY, URINE     Status: Abnormal   Collection Time    05/06/13  2:22 AM      Result Value Range   Preg Test, Ur POSITIVE (*) NEGATIVE  ABO/RH     Status: None   Collection Time    05/06/13  2:45 AM      Result Value Range   ABO/RH(D) B NEG    HCG, QUANTITATIVE, PREGNANCY     Status: Abnormal   Collection Time    05/06/13  2:45 AM      Result Value Range   hCG, Beta Chain, Quant, S [id number] (*)  <5 mIU/mL  WET PREP, GENITAL     Status: Abnormal   Collection Time    05/06/13  2:45 AM      Result Value Range   Yeast Wet Prep HPF POC NONE SEEN  NONE SEEN   Trich, Wet Prep NONE SEEN  NONE SEEN   Clue Cells Wet Prep HPF POC FEW (*) NONE SEEN   WBC, Wet Prep HPF POC FEW (*) NONE SEEN  CBC     Status: Abnormal   Collection Time    05/06/13  2:45 AM      Result Value Range   WBC 10.4  4.0 - 10.5 K/uL   RBC 4.33  3.87 - 5.11 MIL/uL   Hemoglobin 11.9 (*) 12.0 - 15.0 g/dL   HCT 21.3 (*) 08.6 - 57.8 %   MCV 78.8  78.0 - 100.0 fL   MCH 27.5  26.0 - 34.0 pg   MCHC 34.9  30.0 - 36.0 g/dL   RDW 46.9  62.9 - 52.8 %   Platelets 246  150 - 400 K/uL  RH IG WORKUP (INCLUDES ABO/RH)     Status: None   Collection Time    05/06/13  2:54 AM      Result Value Range   Gestational Age(Wks) 7     ABO/RH(D) B NEG     Antibody Screen NEG     Unit Number [id number]     Blood Component Type RHIG     Unit division 00     Status of Unit ISSUED     Transfusion Status OK TO TRANSFUSE    GLUCOSE, CAPILLARY     Status: Abnormal   Collection Time    05/06/13  4:27 AM      Result Value Range   Glucose-Capillary 127 (*) 70 - 99 mg/dL   Comment 1 Notify RN       MAU Course  Procedures    Assessment and Plan  27 y.o. [id number] at [redacted]w[redacted]d with vaginal bleeding. - IUP confirmed but no FHTs.  F/U ultrasound in 1 week.  Threatened miscarriage. - Rh negative - RhoPhylac given - Diabetes - blood sugar 127 after several hours of fasting. Urged pt to see PCP and establish prenatal care as soon as possible. - BV - few clue cells and asymptomatic. Defer treatment for now. - Stable for discharge home.  Napoleon Form 05/06/2013, 2:50 AM

## 2013-05-07 LAB — RH IG WORKUP (INCLUDES ABO/RH)
ABO/RH(D): B NEG
Antibody Screen: NEGATIVE
Gestational Age(Wks): 7

## 2013-05-07 NOTE — MAU Provider Note (Signed)
Attestation of Attending Supervision of Advanced Practitioner (CNM/NP): Evaluation and management procedures were performed by the Advanced Practitioner under my supervision and collaboration.  I have reviewed the Advanced Practitioner's note and chart, and I agree with the management and plan.  Jaemarie Hochberg 05/07/2013 6:31 AM   

## 2013-05-11 ENCOUNTER — Ambulatory Visit (HOSPITAL_COMMUNITY)
Admission: RE | Admit: 2013-05-11 | Discharge: 2013-05-11 | Disposition: A | Discharge status: Home / Self Care | Payer: Medicare Other | Source: Ambulatory Visit | Attending: Family Medicine | Admitting: Family Medicine

## 2013-05-11 ENCOUNTER — Inpatient Hospital Stay (HOSPITAL_COMMUNITY)
Admission: AD | Admit: 2013-05-11 | Discharge: 2013-05-11 | Disposition: A | Discharge status: Home / Self Care | Payer: Medicare Other | Source: Ambulatory Visit | Attending: Obstetrics & Gynecology | Admitting: Obstetrics & Gynecology

## 2013-05-11 ENCOUNTER — Ambulatory Visit (HOSPITAL_COMMUNITY): Payer: Medicare Other

## 2013-05-11 DIAGNOSIS — O209 Hemorrhage in early pregnancy, unspecified: Secondary | ICD-10-CM | POA: Insufficient documentation

## 2013-05-11 DIAGNOSIS — Z3689 Encounter for other specified antenatal screening: Secondary | ICD-10-CM | POA: Insufficient documentation

## 2013-05-11 DIAGNOSIS — O034 Incomplete spontaneous abortion without complication: Secondary | ICD-10-CM | POA: Insufficient documentation

## 2013-05-11 NOTE — MAU Note (Signed)
Patient to MAU after ultrasound. Patient denies pain but does have a little spotting.

## 2013-05-11 NOTE — MAU Provider Note (Signed)
Attestation of Attending Supervision of Advanced Practitioner (PA/CNM/NP): Evaluation and management procedures were performed by the Advanced Practitioner under my supervision and collaboration.  I have reviewed the Advanced Practitioner's note and chart, and I agree with the management and plan.  Emanii Bugbee, MD, FACOG Attending Obstetrician & Gynecologist Faculty Practice, [facility]  

## 2013-05-11 NOTE — MAU Provider Note (Signed)
  History     CSN: [id number]  Arrival date and time: 05/11/13 1109   None     Chief Complaint  Patient presents with  . Follow-up   HPI  Pt is [id number] at 7.5 wks IUP here for follow-up ultrasound.  Seen on 05/06/13 for vaginal bleeding.  BHCG [id number].  Ultrasound showed a fetus with no cardiac activity.  No report of vaginal bleeding or pain.    Past Medical History  Diagnosis Date  . Diabetes mellitus without complication     Past Surgical History  Procedure Laterality Date  . Cesarean section      No family history on file.  History  Substance Use Topics  . Smoking status: Current Every Day Smoker -- 0.50 packs/day    Types: Cigarettes  . Smokeless tobacco: Not on file  . Alcohol Use: Yes     Comment: 05/05/2013    Allergies: No Known Allergies  Prescriptions prior to admission  Medication Sig Dispense Refill  . ondansetron (ZOFRAN ODT) 4 MG disintegrating tablet Take 1 tablet (4 mg total) by mouth every 6 (six) hours as needed for nausea.  20 tablet  0  . Prenatal Multivit-Min-Fe-FA (PRENATAL VITAMINS) 0.8 MG tablet Take 1 tablet by mouth daily.  30 tablet  12    ROS  See HPI  Physical Exam   Blood pressure 116/60, pulse 60, temperature 98 F (36.7 C), temperature source Oral, resp. rate 16, last menstrual period 03/18/2013, SpO2 100.00%.  Physical Exam  Constitutional: She is oriented to person, place, and time. She appears well-developed and well-nourished. No distress.  Very tearful  HENT:  Head: Normocephalic.  Neck: Normal range of motion. Neck supple.  Neurological: She is alert and oriented to person, place, and time. She has normal reflexes.  Skin: Skin is warm and dry.   Pelvic - deferred by patient  MAU Course  Procedures   Assessment and Plan  Incomplete Miscarriage  Plan: Pt desires to do expectant management for at least a week. Will return to clinic for follow-up if no spontaneous miscarriage will do  cytotec.  [facility] 05/11/2013, 12:06 PM

## 2013-05-18 ENCOUNTER — Other Ambulatory Visit: Payer: Self-pay | Admitting: Obstetrics & Gynecology

## 2013-05-18 DIAGNOSIS — Z8759 Personal history of other complications of pregnancy, childbirth and the puerperium: Secondary | ICD-10-CM | POA: Insufficient documentation

## 2013-05-18 DIAGNOSIS — O021 Missed abortion: Secondary | ICD-10-CM

## 2013-05-18 NOTE — Progress Notes (Signed)
Patient seen on 05/11/13 with bleeding in MAU; diagnosed with 6 week MAB.  She was stable on exam and was discharged to home with expectant management. Patient called today to report she had some bleeding but is unsure she passed the MAB.  Repeat ultrasound scheduled for Monday 05/22/2013 at 11:30 am and follow up appointment in the GYN Clinic at 12:45 pm the same day.  Patient was informed of these upcoming appointments.   Jaynie Collins, MD, FACOG Attending Obstetrician & Gynecologist Faculty Practice, [facility]

## 2013-05-19 ENCOUNTER — Encounter: Payer: Medicare Other | Admitting: Advanced Practice Midwife

## 2013-05-22 ENCOUNTER — Encounter: Payer: Medicare Other | Admitting: Obstetrics & Gynecology

## 2013-05-22 ENCOUNTER — Ambulatory Visit (HOSPITAL_COMMUNITY): Payer: Medicare Other | Attending: Obstetrics & Gynecology

## 2014-02-19 ENCOUNTER — Emergency Department (HOSPITAL_COMMUNITY)
Admission: EM | Admit: 2014-02-19 | Discharge: 2014-02-19 | Disposition: A | Discharge status: Home / Self Care | Payer: Medicare Other | Attending: Emergency Medicine | Admitting: Emergency Medicine

## 2014-02-19 ENCOUNTER — Encounter (HOSPITAL_COMMUNITY): Payer: Self-pay | Admitting: Emergency Medicine

## 2014-02-19 DIAGNOSIS — E119 Type 2 diabetes mellitus without complications: Secondary | ICD-10-CM | POA: Insufficient documentation

## 2014-02-19 DIAGNOSIS — Z3202 Encounter for pregnancy test, result negative: Secondary | ICD-10-CM | POA: Insufficient documentation

## 2014-02-19 DIAGNOSIS — R112 Nausea with vomiting, unspecified: Secondary | ICD-10-CM | POA: Insufficient documentation

## 2014-02-19 DIAGNOSIS — R5383 Other fatigue: Secondary | ICD-10-CM

## 2014-02-19 DIAGNOSIS — R109 Unspecified abdominal pain: Secondary | ICD-10-CM

## 2014-02-19 DIAGNOSIS — R1084 Generalized abdominal pain: Secondary | ICD-10-CM | POA: Insufficient documentation

## 2014-02-19 DIAGNOSIS — R197 Diarrhea, unspecified: Secondary | ICD-10-CM | POA: Insufficient documentation

## 2014-02-19 DIAGNOSIS — R5381 Other malaise: Secondary | ICD-10-CM | POA: Insufficient documentation

## 2014-02-19 DIAGNOSIS — F17200 Nicotine dependence, unspecified, uncomplicated: Secondary | ICD-10-CM | POA: Insufficient documentation

## 2014-02-19 LAB — URINALYSIS, ROUTINE W REFLEX MICROSCOPIC
Bilirubin Urine: NEGATIVE
Glucose, UA: NEGATIVE mg/dL
Hgb urine dipstick: NEGATIVE
KETONES UR: NEGATIVE mg/dL
NITRITE: NEGATIVE
PH: 6 (ref 5.0–8.0)
Protein, ur: NEGATIVE mg/dL
SPECIFIC GRAVITY, URINE: 1.027 (ref 1.005–1.030)
Urobilinogen, UA: 0.2 mg/dL (ref 0.0–1.0)

## 2014-02-19 LAB — CBC WITH DIFFERENTIAL/PLATELET
BASOS ABS: 0 10*3/uL (ref 0.0–0.1)
BASOS PCT: 0 % (ref 0–1)
EOS PCT: 0 % (ref 0–5)
Eosinophils Absolute: 0 10*3/uL (ref 0.0–0.7)
HEMATOCRIT: 39 % (ref 36.0–46.0)
Hemoglobin: 13 g/dL (ref 12.0–15.0)
Lymphocytes Relative: 18 % (ref 12–46)
Lymphs Abs: 1.4 10*3/uL (ref 0.7–4.0)
MCH: 27.6 pg (ref 26.0–34.0)
MCHC: 33.3 g/dL (ref 30.0–36.0)
MCV: 82.8 fL (ref 78.0–100.0)
MONO ABS: 0.5 10*3/uL (ref 0.1–1.0)
Monocytes Relative: 6 % (ref 3–12)
Neutro Abs: 6 10*3/uL (ref 1.7–7.7)
Neutrophils Relative %: 76 % (ref 43–77)
Platelets: 242 10*3/uL (ref 150–400)
RBC: 4.71 MIL/uL (ref 3.87–5.11)
RDW: 14 % (ref 11.5–15.5)
WBC: 7.9 10*3/uL (ref 4.0–10.5)

## 2014-02-19 LAB — COMPREHENSIVE METABOLIC PANEL
ALBUMIN: 4.1 g/dL (ref 3.5–5.2)
ALT: 18 U/L (ref 0–35)
AST: 19 U/L (ref 0–37)
Alkaline Phosphatase: 54 U/L (ref 39–117)
BUN: 12 mg/dL (ref 6–23)
CALCIUM: 9.3 mg/dL (ref 8.4–10.5)
CO2: 25 mEq/L (ref 19–32)
CREATININE: 0.75 mg/dL (ref 0.50–1.10)
Chloride: 102 mEq/L (ref 96–112)
GFR calc Af Amer: >90 mL/min (ref >90)
GFR calc non Af Amer: >90 mL/min (ref >90)
Glucose, Bld: 99 mg/dL (ref 70–99)
Potassium: 4.4 mEq/L (ref 3.7–5.3)
Sodium: 139 mEq/L (ref 137–147)
TOTAL PROTEIN: 7.6 g/dL (ref 6.0–8.3)
Total Bilirubin: <0.2 mg/dL — ABNORMAL LOW (ref 0.3–1.2)

## 2014-02-19 LAB — POC URINE PREG, ED: PREG TEST UR: NEGATIVE

## 2014-02-19 LAB — URINE MICROSCOPIC-ADD ON

## 2014-02-19 LAB — LIPASE, BLOOD: Lipase: 22 U/L (ref 11–59)

## 2014-02-19 LAB — CBG MONITORING, ED: GLUCOSE-CAPILLARY: 113 mg/dL — AB (ref 70–99)

## 2014-02-19 MED ORDER — ONDANSETRON 8 MG PO TBDP: 8.0000 mg | ORAL_TABLET | Freq: Three times a day (TID) | ORAL | Status: DC | PRN

## 2014-02-19 NOTE — Discharge Instructions (Signed)
Your blood work is good today. Take zofran as prescribed as needed for pain. Drinking plenty of fluids. Advance diet as tolerate tonight. Follow up with your doctor as needed.   Viral Gastroenteritis Viral gastroenteritis is also known as stomach flu. This condition affects the stomach and intestinal tract. It can cause sudden diarrhea and vomiting. The illness typically lasts 3 to 8 days. Most people develop an immune response that eventually gets rid of the virus. While this natural response develops, the virus can make you quite ill. CAUSES  Many different viruses can cause gastroenteritis, such as rotavirus or noroviruses. You can catch one of these viruses by consuming contaminated food or water. You may also catch a virus by sharing utensils or other personal items with an infected person or by touching a contaminated surface. SYMPTOMS  The most common symptoms are diarrhea and vomiting. These problems can cause a severe loss of body fluids (dehydration) and a body salt (electrolyte) imbalance. Other symptoms may include:  Fever.  Headache.  Fatigue.  Abdominal pain. DIAGNOSIS  Your caregiver can usually diagnose viral gastroenteritis based on your symptoms and a physical exam. A stool sample may also be taken to test for the presence of viruses or other infections. TREATMENT  This illness typically goes away on its own. Treatments are aimed at rehydration. The most serious cases of viral gastroenteritis involve vomiting so severely that you are not able to keep fluids down. In these cases, fluids must be given through an intravenous line (IV). HOME CARE INSTRUCTIONS   Drink enough fluids to keep your urine clear or pale yellow. Drink small amounts of fluids frequently and increase the amounts as tolerated.  Ask your caregiver for specific rehydration instructions.  Avoid:  Foods high in sugar.  Alcohol.  Carbonated drinks.  Tobacco.  Juice.  Caffeine drinks.  Extremely  hot or cold fluids.  Fatty, greasy foods.  Too much intake of anything at one time.  Dairy products until 24 to 48 hours after diarrhea stops.  You may consume probiotics. Probiotics are active cultures of beneficial bacteria. They may lessen the amount and number of diarrheal stools in adults. Probiotics can be found in yogurt with active cultures and in supplements.  Wash your hands well to avoid spreading the virus.  Only take over-the-counter or prescription medicines for pain, discomfort, or fever as directed by your caregiver. Do not give aspirin to children. Antidiarrheal medicines are not recommended.  Ask your caregiver if you should continue to take your regular prescribed and over-the-counter medicines.  Keep all follow-up appointments as directed by your caregiver. SEEK IMMEDIATE MEDICAL CARE IF:   You are unable to keep fluids down.  You do not urinate at least once every 6 to 8 hours.  You develop shortness of breath.  You notice blood in your stool or vomit. This may look like coffee grounds.  You have abdominal pain that increases or is concentrated in one small area (localized).  You have persistent vomiting or diarrhea.  You have a fever.  The patient is a child younger than 3 months, and he or she has a fever.  The patient is a child older than 3 months, and he or she has a fever and persistent symptoms.  The patient is a child older than 3 months, and he or she has a fever and symptoms suddenly get worse.  The patient is a baby, and he or she has no tears when crying. MAKE SURE YOU:  Understand these instructions.  Will watch your condition.  Will get help right away if you are not doing well or get worse. Document Released: 10/26/2005 Document Revised: 01/18/2012 Document Reviewed: 08/12/2011 [facility] Patient Information 2014 [place].

## 2014-02-19 NOTE — ED Provider Notes (Signed)
CSN: [id number]     Arrival date & time 02/19/14  1021 History   First MD Initiated Contact with Patient 02/19/14 1109     Chief Complaint  Patient presents with  . Nausea  . Diarrhea  . Emesis     (Consider location/radiation/quality/duration/timing/severity/associated sxs/prior Treatment) HPI Kathryn Mayo is a 28 y.o. female with history of DM, presents to ED with complaint of nausea, vomiting, diarrhea for the last 3 days. Reports associated weakness, feels like dehydrated. Reports diffuse intermittent abdominal cramping. No blood in stool or emesis. No fever or chills. Denies any urinary symptoms. Did not take any medications prior to coming in. Patient's daughter was home with similar complaints, but her symptoms improved. Nothing makes her pain better or worse. States she had multiple episodes of both area and vomiting this morning, which is what brought her to here to the ER.   Past Medical History  Diagnosis Date  . Diabetes mellitus without complication    Past Surgical History  Procedure Laterality Date  . Cesarean section     No family history on file. History  Substance Use Topics  . Smoking status: Current Every Day Smoker -- 0.50 packs/day    Types: Cigarettes  . Smokeless tobacco: Not on file  . Alcohol Use: Yes     Comment: 05/05/2013   OB History   Grav Para Term Preterm Abortions TAB SAB Ect Mult Living   5 3 2 1 1 1    2      Review of Systems  Constitutional: Positive for fatigue. Negative for fever and chills.  Respiratory: Negative for cough, chest tightness and shortness of breath.   Cardiovascular: Negative for chest pain, palpitations and leg swelling.  Gastrointestinal: Positive for nausea, vomiting, abdominal pain and diarrhea. Negative for constipation and blood in stool.  Genitourinary: Negative for dysuria, frequency, flank pain, vaginal bleeding, vaginal discharge, vaginal pain and pelvic pain.  Musculoskeletal: Negative for arthralgias,  myalgias, neck pain and neck stiffness.  Skin: Negative for rash.  Neurological: Positive for weakness. Negative for dizziness and headaches.  All other systems reviewed and are negative.     Allergies  Review of patient's allergies indicates no known allergies.  Home Medications  No current outpatient prescriptions on file. BP 121/69  Pulse 60  Temp(Src) 97.7 F (36.5 C) (Oral)  Resp 16  SpO2 100%  LMP 01/24/2014  Breastfeeding? Unknown Physical Exam  Nursing note and vitals reviewed. Constitutional: She appears well-developed and well-nourished. No distress.  HENT:  Head: Normocephalic.  Eyes: Conjunctivae are normal.  Neck: Neck supple.  Cardiovascular: Normal rate, regular rhythm and normal heart sounds.   Pulmonary/Chest: Effort normal and breath sounds normal. No respiratory distress. She has no wheezes. She has no rales.  Abdominal: Soft. Bowel sounds are normal. She exhibits no distension. There is no tenderness. There is no rebound.  Musculoskeletal: She exhibits no edema.  Neurological: She is alert.  Skin: Skin is warm and dry.  Psychiatric: She has a normal mood and affect. Her behavior is normal.    ED Course  Procedures (including critical care time) Labs Review Labs Reviewed  COMPREHENSIVE METABOLIC PANEL - Abnormal; Notable for the following:    Total Bilirubin <0.2 (*)    All other components within normal limits  URINALYSIS, ROUTINE W REFLEX MICROSCOPIC - Abnormal; Notable for the following:    Color, Urine AMBER (*)    APPearance CLOUDY (*)    Leukocytes, UA TRACE (*)    All other  components within normal limits  URINE MICROSCOPIC-ADD ON - Abnormal; Notable for the following:    Bacteria, UA FEW (*)    All other components within normal limits  CBG MONITORING, ED - Abnormal; Notable for the following:    Glucose-Capillary 113 (*)    All other components within normal limits  CBC WITH DIFFERENTIAL  LIPASE, BLOOD  POC URINE PREG, ED    Imaging Review No results found.   EKG Interpretation None      MDM   Final diagnoses:  Abdominal pain  Nausea vomiting and diarrhea   Patient's lab work and fluid started by triage nurses. The time I went and saw patient, she's feeling much better. She denies any pain or vomiting at this time. Her abdomen is soft and nontender. Will   try by mouth challenge. I suspect patient has gastroenteritis, given her daughter had similar symptoms. No surgical abdomen on my exam. Will discharge her home with able to tolerate by mouth fluids with nausea medications.   Pt tolerated PO fluids. Home with zofran. Follow up as needed.   Filed Vitals:   02/19/14 1029  BP: 121/69  Pulse: 60  Temp: 97.7 F (36.5 C)  TempSrc: Oral  Resp: 16  SpO2: 100%        Sloane Junkin A Kelvis Berger, PA-C 02/19/14 1601

## 2014-02-19 NOTE — ED Notes (Signed)
Patient reports she has been sick since Friday with nausea, vomiting, diarrhea.

## 2014-02-19 NOTE — ED Notes (Signed)
Pt reports n/v/d x 3 days. Pt has had 3 bouts of vomit and 5 of diarrhea this am. Pt reports feeling weak. Reports generalized stomach pain that is intermittent at 8/10.

## 2014-02-19 NOTE — ED Notes (Signed)
Patient denies pain and nausea.

## 2014-02-20 NOTE — ED Provider Notes (Signed)
Medical screening examination/treatment/procedure(s) were performed by non-physician practitioner and as supervising physician I was immediately available for consultation/collaboration.  Francesca Strome L Arbie Blankley, MD 02/20/14 2000 

## 2014-06-02 ENCOUNTER — Emergency Department (HOSPITAL_COMMUNITY)
Admission: EM | Admit: 2014-06-02 | Discharge: 2014-06-02 | Discharge status: Against Medical Advice | Payer: Medicare Other | Attending: Emergency Medicine | Admitting: Emergency Medicine

## 2014-06-02 ENCOUNTER — Encounter (HOSPITAL_COMMUNITY): Payer: Self-pay | Admitting: Emergency Medicine

## 2014-06-02 DIAGNOSIS — L241 Irritant contact dermatitis due to oils and greases: Secondary | ICD-10-CM | POA: Diagnosis not present

## 2014-06-02 DIAGNOSIS — R21 Rash and other nonspecific skin eruption: Secondary | ICD-10-CM | POA: Diagnosis present

## 2014-06-02 DIAGNOSIS — E119 Type 2 diabetes mellitus without complications: Secondary | ICD-10-CM | POA: Diagnosis not present

## 2014-06-02 DIAGNOSIS — F17200 Nicotine dependence, unspecified, uncomplicated: Secondary | ICD-10-CM | POA: Diagnosis not present

## 2014-06-02 MED ORDER — SODIUM CHLORIDE 0.9 % IV SOLN: 1000.0000 mL | Freq: Once | INTRAVENOUS | Status: DC

## 2014-06-02 NOTE — ED Notes (Addendum)
Pt arrived to the ED with a complaint of an allergic reaction.  Pt woke up about a half an hour ago and began itching Pt has a rash all over.  Pt used coconut oil for the first time last night.  Pt states her lips are getting swollen and her throat is swelling.  Pt took two pills of benadryl prior to arrival

## 2014-09-10 ENCOUNTER — Encounter (HOSPITAL_COMMUNITY): Payer: Self-pay | Admitting: Emergency Medicine

## 2015-03-07 ENCOUNTER — Telehealth: Payer: Self-pay | Admitting: General Practice

## 2015-03-07 LAB — PREGNANCY, URINE: PREG TEST UR: POSITIVE

## 2015-03-07 NOTE — Telephone Encounter (Signed)
Patient called in to front office stating she would like to know when her last rhogam was. Told patient 6/14. Patient asked if she received that following her miscarriage. Told patient yes. Patient states she just found out she was pregnant and she is just trying to make sure she has everything in order. Told patient she will not need to receive rhogam in this pregnancy until she is 28 weeks then again after baby is born.  Patient verbalized understanding and states that she is a vegetarian and wants to know if this will be a problem with her being pregnant. Told patient no but she needs to make sure she is taking a PNV, getting iron from other sources like beans and dark green leafy vegetables as well as protein from nuts, beans, dairy products, eggs, or quinoa. Patient verbalized understanding to all and asked about getting a new OB appt. Per chart review patient is diabetic. Told patient we will need a proof of pregnancy either at our office or another office to make her appt and she will need to be in our high risk clinic due to her diabetes. Patient verbalized understanding to all and had no other questions

## 2015-03-12 ENCOUNTER — Encounter: Payer: Self-pay | Admitting: *Deleted

## 2015-03-19 ENCOUNTER — Inpatient Hospital Stay (HOSPITAL_COMMUNITY)
Admission: AD | Admit: 2015-03-19 | Discharge: 2015-03-19 | Disposition: A | Discharge status: Home / Self Care | Payer: Medicare Other | Source: Ambulatory Visit | Attending: Family Medicine | Admitting: Family Medicine

## 2015-03-19 ENCOUNTER — Inpatient Hospital Stay (HOSPITAL_COMMUNITY): Payer: Medicare Other

## 2015-03-19 ENCOUNTER — Encounter (HOSPITAL_COMMUNITY): Payer: Self-pay | Admitting: *Deleted

## 2015-03-19 DIAGNOSIS — R109 Unspecified abdominal pain: Secondary | ICD-10-CM | POA: Diagnosis present

## 2015-03-19 DIAGNOSIS — O23591 Infection of other part of genital tract in pregnancy, first trimester: Secondary | ICD-10-CM | POA: Diagnosis not present

## 2015-03-19 DIAGNOSIS — B9689 Other specified bacterial agents as the cause of diseases classified elsewhere: Secondary | ICD-10-CM

## 2015-03-19 DIAGNOSIS — N760 Acute vaginitis: Secondary | ICD-10-CM | POA: Diagnosis not present

## 2015-03-19 DIAGNOSIS — Z3A01 Less than 8 weeks gestation of pregnancy: Secondary | ICD-10-CM | POA: Diagnosis not present

## 2015-03-19 DIAGNOSIS — O209 Hemorrhage in early pregnancy, unspecified: Secondary | ICD-10-CM | POA: Insufficient documentation

## 2015-03-19 DIAGNOSIS — O360110 Maternal care for anti-D [Rh] antibodies, first trimester, not applicable or unspecified: Secondary | ICD-10-CM

## 2015-03-19 LAB — WET PREP, GENITAL
Trich, Wet Prep: NONE SEEN
YEAST WET PREP: NONE SEEN

## 2015-03-19 LAB — CBC
HCT: 34.6 % — ABNORMAL LOW (ref 36.0–46.0)
Hemoglobin: 11.7 g/dL — ABNORMAL LOW (ref 12.0–15.0)
MCH: 26.9 pg (ref 26.0–34.0)
MCHC: 33.8 g/dL (ref 30.0–36.0)
MCV: 79.5 fL (ref 78.0–100.0)
PLATELETS: 250 10*3/uL (ref 150–400)
RBC: 4.35 MIL/uL (ref 3.87–5.11)
RDW: 15 % (ref 11.5–15.5)
WBC: 5.6 10*3/uL (ref 4.0–10.5)

## 2015-03-19 LAB — URINALYSIS, ROUTINE W REFLEX MICROSCOPIC
Bilirubin Urine: NEGATIVE
GLUCOSE, UA: NEGATIVE mg/dL
KETONES UR: NEGATIVE mg/dL
LEUKOCYTES UA: NEGATIVE
Nitrite: NEGATIVE
PH: 6.5 (ref 5.0–8.0)
Protein, ur: NEGATIVE mg/dL
Specific Gravity, Urine: 1.02 (ref 1.005–1.030)
Urobilinogen, UA: 0.2 mg/dL (ref 0.0–1.0)

## 2015-03-19 LAB — HCG, QUANTITATIVE, PREGNANCY: hCG, Beta Chain, Quant, S: 12141 m[IU]/mL — ABNORMAL HIGH (ref <5)

## 2015-03-19 LAB — HIV ANTIBODY (ROUTINE TESTING W REFLEX): HIV Screen 4th Generation wRfx: NONREACTIVE

## 2015-03-19 LAB — URINE MICROSCOPIC-ADD ON

## 2015-03-19 MED ORDER — METRONIDAZOLE 500 MG PO TABS: 500.0000 mg | ORAL_TABLET | Freq: Two times a day (BID) | ORAL | Status: DC

## 2015-03-19 MED ORDER — RHO D IMMUNE GLOBULIN 1500 UNIT/2ML IJ SOSY
300.0000 ug | PREFILLED_SYRINGE | Freq: Once | INTRAMUSCULAR | Status: AC
  Administered 2015-03-19: 300 ug via INTRAMUSCULAR
  Filled 2015-03-19: qty 2

## 2015-03-19 NOTE — MAU Provider Note (Signed)
Chief Complaint: Vaginal Bleeding and Abdominal Cramping   First Provider Initiated Contact with Patient 03/19/15 [phone]     SUBJECTIVE HPI: Kathryn Mayo is a 29 y.o. [id number] at 5716w4d by LMP who presents to [facility] reporting spotting x 2 days and mild cramping same as other pregnancies. Denies fever, chills, passage or clots or tissue. [facility] Blood type B neg. [facility]. Diabetic no meds  Past Medical History  Diagnosis Date  . Diabetes mellitus without complication   . Neuropathy    OB History  Gravida Para Term Preterm AB SAB TAB Ectopic Multiple Living  7 3 2 1 3 1 2   2     # Outcome Date GA Lbr Len/2nd Weight Sex Delivery Anes PTL Lv  7 Current           6 Term 2007    F CS-Unspec Spinal  Y  5 Term 2005    M CS-Unspec   Y  4 Preterm 2002 4344w0d   F    FD  3 TAB           2 SAB           1 TAB              Past Surgical History  Procedure Laterality Date  . Cesarean section      C/S x 2  . Wisdom tooth extraction     History   Social History  . Marital Status: Single    Spouse Name: N/A  . Number of Children: N/A  . Years of Education: N/A   Occupational History  . Not on file.   Social History Main Topics  . Smoking status: Current Every Day Smoker -- 0.25 packs/day    Types: Cigarettes  . Smokeless tobacco: Not on file  . Alcohol Use: No  . Drug Use: No  . Sexual Activity: Not on file   Other Topics Concern  . Not on file   Social History Narrative   No current facility-administered medications on file prior to encounter.   No current outpatient prescriptions on file prior to encounter.   Allergies  Allergen Reactions  . Shellfish Allergy Itching    Review of Systems  Constitutional: Negative for fever and chills.  Gastrointestinal: Positive for abdominal pain (mild cramping). Negative for nausea, vomiting, diarrhea and constipation.  Genitourinary: Negative for dysuria, urgency, frequency,  hematuria and flank pain.       Pos for vaginal bleeding. Neg for vaginal discharge, itching or odor.   Endo/Heme/Allergies:       Pos for cold intolerance. (Normal TSH at PCP office two week ago per pt)    OBJECTIVE Blood pressure 111/49, pulse 76, temperature 99 F (37.2 C), temperature source Oral, resp. rate 16, last menstrual period 01/25/2015,  GENERAL: Well-developed, well-nourished, obese female in no acute distress.  HEART: normal rate RESP: normal effort GI: Abdomen soft, non-tender. Positive bowel sounds 4. MS: Nontender, no edema NEURO: Alert and oriented SPECULUM EXAM: NEFG, small amount of tan, malodorous discharge, no frank blood noted, cervix clean, non-friable. BIMANUAL: cervix closed; uterine size difficult to assess due to maternal body habitus, no adnexal tenderness or masses. No CMT.   LAB RESULTS Results for orders placed or performed during the hospital encounter of 03/19/15 (from the past 24 hour(s))  Urinalysis, Routine w reflex microscopic     Status: Abnormal   Collection Time: 03/19/15  8:04 AM  Result  Value Ref Range   Color, Urine YELLOW YELLOW   APPearance CLEAR CLEAR   Specific Gravity, Urine 1.020 1.005 - 1.030   pH 6.5 5.0 - 8.0   Glucose, UA NEGATIVE NEGATIVE mg/dL   Hgb urine dipstick TRACE (A) NEGATIVE   Bilirubin Urine NEGATIVE NEGATIVE   Ketones, ur NEGATIVE NEGATIVE mg/dL   Protein, ur NEGATIVE NEGATIVE mg/dL   Urobilinogen, UA 0.2 0.0 - 1.0 mg/dL   Nitrite NEGATIVE NEGATIVE   Leukocytes, UA NEGATIVE NEGATIVE  Urine microscopic-add on     Status: None   Collection Time: 03/19/15  8:04 AM  Result Value Ref Range   Squamous Epithelial / LPF RARE RARE   WBC, UA 0-2 <3 WBC/hpf  Wet prep, genital     Status: Abnormal   Collection Time: 03/19/15  8:50 AM  Result Value Ref Range   Yeast Wet Prep HPF POC NONE SEEN NONE SEEN   Trich, Wet Prep NONE SEEN NONE SEEN   Clue Cells Wet Prep HPF POC FEW (A) NONE SEEN   WBC, Wet Prep HPF POC FEW  (A) NONE SEEN  CBC     Status: Abnormal   Collection Time: 03/19/15  9:01 AM  Result Value Ref Range   WBC 5.6 4.0 - 10.5 K/uL   RBC 4.35 3.87 - 5.11 MIL/uL   Hemoglobin 11.7 (L) 12.0 - 15.0 g/dL   HCT 28.434.6 (L) 13.236.0 - 44.046.0 %   MCV 79.5 78.0 - 100.0 fL   MCH 26.9 26.0 - 34.0 pg   MCHC 33.8 30.0 - 36.0 g/dL   RDW 10.215.0 72.511.5 - 36.615.5 %   Platelets 250 150 - 400 K/uL  Rh IG workup (includes ABO/Rh)     Status: None (Preliminary result)   Collection Time: 03/19/15  9:01 AM  Result Value Ref Range   Gestational Age(Wks) 22.3    ABO/RH(D) B NEG    Antibody Screen PENDING    Fetal Screen PENDING    Quant 12,141  IMAGING [place] Ob Comp Less 14 Wks  03/19/2015   CLINICAL DATA:  Pregnant, mild cramping, light bleeding  EXAM: OBSTETRIC <14 WK US AND TRANSVAGINAL OB US  TECHNIQUE: Both transabdominal and transvaginal ultrasound examinations were performed for complete evaluation of the gestation as well as the maternal uterus, adnexal regions, and pelvic cul-de-sac. Transvaginal technique was performed to assess early pregnancy.  COMPARISON:  None.  FINDINGS: Intrauterine gestational sac: Visualized/normal in shape.  Yolk sac:  Not visualized  Embryo:  Not visualized  MSD: 9.0  mm   5 w   4  d  Maternal uterus/adnexae: Small subchronic hemorrhage.  Bilateral ovaries are within normal limits.  Trace pelvic fluid.  IMPRESSION: Single intrauterine gestation, with estimated gestational age [redacted] weeks 4 days by mean sac diameter.  No yolk sac or fetal pole is visualized.  Consider follow-up pelvic ultrasound in 14 days to confirm viability as clinically warranted.   Electronically Signed   By: Charline BillsSriyesh  Krishnan M.D.   On: 03/19/2015 09:57   [place] Ob Transvaginal  03/19/2015   CLINICAL DATA:  Pregnant, mild cramping, light bleeding  EXAM: OBSTETRIC <14 WK US AND TRANSVAGINAL OB US  TECHNIQUE: Both transabdominal and transvaginal ultrasound examinations were performed for complete evaluation of the gestation as well as  the maternal uterus, adnexal regions, and pelvic cul-de-sac. Transvaginal technique was performed to assess early pregnancy.  COMPARISON:  None.  FINDINGS: Intrauterine gestational sac: Visualized/normal in shape.  Yolk sac:  Not visualized  Embryo:  Not  visualized  MSD: 9.0  mm   5 w   4  d  Maternal uterus/adnexae: Small subchronic hemorrhage.  Bilateral ovaries are within normal limits.  Trace pelvic fluid.  IMPRESSION: Single intrauterine gestation, with estimated gestational age [redacted] weeks 4 days by mean sac diameter.  No yolk sac or fetal pole is visualized.  Consider follow-up pelvic ultrasound in 14 days to confirm viability as clinically warranted.   Electronically Signed   By: Charline Bills M.D.   On: 03/19/2015 09:57     MAU COURSE CBC, [place], Wet, Prep, GC/Chlamydia, Rh work-up.   Rhophylac given.   ASSESSMENT 1. Vaginal bleeding before [redacted] weeks gestation   2. Rh negative, antepartum, first trimester, not applicable or unspecified fetus    PLAN Discharge home in stable condition. F/U quant in MAU IN 48 hours. SAB, ectopic precautions. Pelvic rest x 1 week.     Follow-up Information    Follow up with [facility] On 04/01/2015.   Specialty:  Obstetrics and Gynecology   Why:  Start prenatal care   Contact information:   [address] [place] [address] [phone]      Follow up with [facility] [place] [facility].   Why:  As needed in emergencies   Contact information:   [address] [id number] mc [place] [address] [phone]       Medication List    ASK your doctor about these medications        prenatal multivitamin Tabs tablet  Take 1 tablet by mouth daily at 12 noon.       [place], CNM 03/19/2015  9:49 AM

## 2015-03-19 NOTE — Discharge Instructions (Signed)
Vaginal Bleeding During Pregnancy, First Trimester °A small amount of bleeding (spotting) from the vagina is relatively common in early pregnancy. It usually stops on its own. Various things may cause bleeding or spotting in early pregnancy. Some bleeding may be related to the pregnancy, and some may not. In most cases, the bleeding is normal and is not a problem. However, bleeding can also be a sign of something serious. Be sure to tell your health care provider about any vaginal bleeding right away. °Some possible causes of vaginal bleeding during the first trimester include: °· Infection or inflammation of the cervix. °· Growths (polyps) on the cervix. °· Miscarriage or threatened miscarriage. °· Pregnancy tissue has developed outside of the uterus and in a fallopian tube (tubal pregnancy). °· Tiny cysts have developed in the uterus instead of pregnancy tissue (molar pregnancy). °HOME CARE INSTRUCTIONS  °Watch your condition for any changes. The following actions may help to lessen any discomfort you are feeling: °· Follow your health care provider's instructions for limiting your activity. If your health care provider orders bed rest, you may need to stay in bed and only get up to use the bathroom. However, your health care provider may allow you to continue light activity. °· If needed, make plans for someone to help with your regular activities and responsibilities while you are on bed rest. °· Keep track of the number of pads you use each day, how often you change pads, and how soaked (saturated) they are. Write this down. °· Do not use tampons. Do not douche. °· Do not have sexual intercourse or orgasms until approved by your health care provider. °· If you pass any tissue from your vagina, save the tissue so you can show it to your health care provider. °· Only take over-the-counter or prescription medicines as directed by your health care provider. °· Do not take aspirin because it can make you  bleed. °· Keep all follow-up appointments as directed by your health care provider. °SEEK MEDICAL CARE IF: °· You have any vaginal bleeding during any part of your pregnancy. °· You have cramps or labor pains. °· You have a fever, not controlled by medicine. °SEEK IMMEDIATE MEDICAL CARE IF:  °· You have severe cramps in your back or belly (abdomen). °· You pass large clots or tissue from your vagina. °· Your bleeding increases. °· You feel light-headed or weak, or you have fainting episodes. °· You have chills. °· You are leaking fluid or have a gush of fluid from your vagina. °· You pass out while having a bowel movement. °MAKE SURE YOU: °· Understand these instructions. °· Will watch your condition. °· Will get help right away if you are not doing well or get worse. °Document Released: 08/05/2005 Document Revised: 10/31/2013 Document Reviewed: 07/03/2013 °ExitCare® Patient Information ©2015 [employer]. This information is not intended to replace advice given to you by your health care provider. Make sure you discuss any questions you have with your health care provider. °Bacterial Vaginosis °Bacterial vaginosis is a vaginal infection that occurs when the normal balance of bacteria in the vagina is disrupted. It results from an overgrowth of certain bacteria. This is the most common vaginal infection in women of childbearing age. Treatment is important to prevent complications, especially in pregnant women, as it can cause a premature delivery. °CAUSES  °Bacterial vaginosis is caused by an increase in harmful bacteria that are normally present in smaller amounts in the vagina. Several different kinds of bacteria can   of bacteria can cause bacterial vaginosis. However, the reason that the condition develops is not fully understood. RISK FACTORS Certain activities or behaviors can put you at an increased risk of developing bacterial vaginosis, including:  Having a new sex partner or multiple sex  partners.  Douching.  Using an intrauterine device (IUD) for contraception. Women do not get bacterial vaginosis from toilet seats, bedding, swimming pools, or contact with objects around them. SIGNS AND SYMPTOMS  Some women with bacterial vaginosis have no signs or symptoms. Common symptoms include:  Grey vaginal discharge.  A fishlike odor with discharge, especially after sexual intercourse.  Itching or burning of the vagina and vulva.  Burning or pain with urination. DIAGNOSIS  Your health care provider will take a medical history and examine the vagina for signs of bacterial vaginosis. A sample of vaginal fluid may be taken. Your health care provider will look at this sample under a microscope to check for bacteria and abnormal cells. A vaginal pH test may also be done.  TREATMENT  Bacterial vaginosis may be treated with antibiotic medicines. These may be given in the form of a pill or a vaginal cream. A second round of antibiotics may be prescribed if the condition comes back after treatment.  HOME CARE INSTRUCTIONS   Only take over-the-counter or prescription medicines as directed by your health care provider.  If antibiotic medicine was prescribed, take it as directed. Make sure you finish it even if you start to feel better.  Do not have sex until treatment is completed.  Tell all sexual partners that you have a vaginal infection. They should see their health care provider and be treated if they have problems, such as a mild rash or itching.  Practice safe sex by using condoms and only having one sex partner. SEEK MEDICAL CARE IF:   Your symptoms are not improving after 3 days of treatment.  You have increased discharge or pain.  You have a fever. MAKE SURE YOU:   Understand these instructions.  Will watch your condition.  Will get help right away if you are not doing well or get worse. FOR MORE INFORMATION  Centers for Disease Control and Prevention, Division of  STD Prevention: SolutionApps.co.zawww.cdc.gov/std American Sexual Health Association (ASHA): www.ashastd.org  Document Released: 10/26/2005 Document Revised: 08/16/2013 Document Reviewed: 06/07/2013 [facility] Patient Information 2015 [employer], [place]. This information is not intended to replace advice given to you by your health care provider. Make sure you discuss any questions you have with your health care provider.

## 2015-03-19 NOTE — MAU Note (Signed)
Started bleeding 2 nights ago, light pink, mainly when wipes.  Some mild cramping- "the usual"

## 2015-03-20 LAB — RH IG WORKUP (INCLUDES ABO/RH)
ABO/RH(D): B NEG
ANTIBODY SCREEN: NEGATIVE
FETAL SCREEN: NEGATIVE
Gestational Age(Wks): 22.3
Unit division: 0

## 2015-03-20 LAB — GC/CHLAMYDIA PROBE AMP (~~LOC~~) NOT AT ARMC
CHLAMYDIA, DNA PROBE: NEGATIVE
NEISSERIA GONORRHEA: NEGATIVE

## 2015-03-21 ENCOUNTER — Encounter (HOSPITAL_COMMUNITY): Payer: Self-pay | Admitting: *Deleted

## 2015-03-21 ENCOUNTER — Inpatient Hospital Stay (HOSPITAL_COMMUNITY)
Admission: AD | Admit: 2015-03-21 | Discharge: 2015-03-21 | Disposition: A | Discharge status: Home / Self Care | Payer: Medicare Other | Source: Ambulatory Visit | Attending: Obstetrics & Gynecology | Admitting: Obstetrics & Gynecology

## 2015-03-21 DIAGNOSIS — O020 Blighted ovum and nonhydatidiform mole: Secondary | ICD-10-CM | POA: Diagnosis not present

## 2015-03-21 DIAGNOSIS — O021 Missed abortion: Secondary | ICD-10-CM

## 2015-03-21 LAB — CBC
HEMATOCRIT: 34.8 % — AB (ref 36.0–46.0)
Hemoglobin: 11.8 g/dL — ABNORMAL LOW (ref 12.0–15.0)
MCH: 27.3 pg (ref 26.0–34.0)
MCHC: 33.9 g/dL (ref 30.0–36.0)
MCV: 80.6 fL (ref 78.0–100.0)
Platelets: 244 10*3/uL (ref 150–400)
RBC: 4.32 MIL/uL (ref 3.87–5.11)
RDW: 15.1 % (ref 11.5–15.5)
WBC: 6.4 10*3/uL (ref 4.0–10.5)

## 2015-03-21 LAB — HCG, QUANTITATIVE, PREGNANCY: hCG, Beta Chain, Quant, S: 11710 m[IU]/mL — ABNORMAL HIGH (ref <5)

## 2015-03-21 MED ORDER — OXYCODONE-ACETAMINOPHEN 5-325 MG PO TABS: 1.0000 | ORAL_TABLET | Freq: Four times a day (QID) | ORAL | Status: DC | PRN

## 2015-03-21 MED ORDER — IBUPROFEN 600 MG PO TABS: 600.0000 mg | ORAL_TABLET | Freq: Four times a day (QID) | ORAL | Status: DC | PRN

## 2015-03-21 NOTE — MAU Provider Note (Signed)
[name]  is a 29 y.o. [id number] at 8535w6d who presents to MAU today for follow-up quant hCG after 48 hours. The patient denies pain or bleeding today.   BP 137/64 mmHg  Pulse 81  Temp(Src) 98.4 F (36.9 C)  Resp 16  LMP 01/25/2015 (Exact Date)  CONSTITUTIONAL: Well-developed, well-nourished female in no acute distress.  ENT: External right and left ear normal.  EYES: EOM intact, conjunctivae normal.  MUSCULOSKELETAL: Normal range of motion.  CARDIOVASCULAR: Regular heart rate RESPIRATORY: Normal effort NEUROLOGICAL: Alert and oriented to person, place, and time.  SKIN: Skin is warm and dry. No rash noted. Not diaphoretic. No erythema. No pallor. PSYCH: Normal mood and affect. Normal behavior. Normal judgment and thought content.  Results for Kathryn CapuchinKIRVEN, Kathryn Mayo (MRN [id number]) as of 03/21/2015 11:37  Ref. Range 03/19/2015 09:00 03/19/2015 09:01 03/19/2015 09:40 03/21/2015 08:15  HCG, Beta Chain, Quant, S Latest Ref Range: <5 mIU/mL 12141 (H)   11710 (H)   MDM Discussed patient with Dr. Macon LargeAnyanwu. Confirmed AB at this point. Ok to offer Cytotec vs expectant management Discussed options with the patient. She declines Cytotec.   A: Missed AB vs blighted ovum  P: Discharge home Bleeding precaution discussed [facility]. They will call with an appointment Patient may return to MAU as needed or if her condition were to change or worsen  Marny LowensteinJulie N Wyett Narine, PA-C 03/21/2015 11:37 AM

## 2015-03-21 NOTE — MAU Note (Signed)
Pt presents to MAU for repeat labs BHCG. Denies any vaginal bleeding or pain

## 2015-03-21 NOTE — Discharge Instructions (Signed)
Incomplete Miscarriage A miscarriage is the sudden loss of an unborn baby (fetus) before the 20th week of pregnancy. In an incomplete miscarriage, parts of the fetus or placenta (afterbirth) remain in the body.  Having a miscarriage can be an emotional experience. Talk with your health care provider about any questions you may have about miscarrying, the grieving process, and your future pregnancy plans. CAUSES   Problems with the fetal chromosomes that make it impossible for the baby to develop normally. Problems with the baby's genes or chromosomes are most often the result of errors that occur by chance as the embryo divides and grows. The problems are not inherited from the parents.  Infection of the cervix or uterus.  Hormone problems.  Problems with the cervix, such as having an incompetent cervix. This is when the tissue in the cervix is not strong enough to hold the pregnancy.  Problems with the uterus, such as an abnormally shaped uterus, uterine fibroids, or congenital abnormalities.  Certain medical conditions.  Smoking, drinking alcohol, or taking illegal drugs.  Trauma. SYMPTOMS   Vaginal bleeding or spotting, with or without cramps or pain.  Pain or cramping in the abdomen or lower back.  Passing fluid, tissue, or blood clots from the vagina. DIAGNOSIS  Your health care provider will perform a physical exam. You may also have an ultrasound to confirm the miscarriage. Blood or urine tests may also be ordered. TREATMENT   Usually, a dilation and curettage (D&C) procedure is performed. During a D&C procedure, the cervix is widened (dilated) and any remaining fetal or placental tissue is gently removed from the uterus.  Antibiotic medicines are prescribed if there is an infection. Other medicines may be given to reduce the size of the uterus (contract) if there is a lot of bleeding.  If you have Rh negative blood and your baby was Rh positive, you will need a Rho (D)  immune globulin shot. This shot will protect any future baby from having Rh blood problems in future pregnancies.  You may be confined to bed rest. This means you should stay in bed and only get up to use the bathroom. HOME CARE INSTRUCTIONS   Rest as directed by your health care provider.  Restrict activity as directed by your health care provider. You may be allowed to continue light activity if curettage was not done but you require further treatment.  Keep track of the number of pads you use each day. Keep track of how soaked (saturated) they are. Record this information.  Do not  use tampons.  Do not douche or have sexual intercourse until approved by your health care provider.  Keep all follow-up appointments for reevaluation and continuing management.  Only take over-the-counter or prescription medicines for pain, fever, or discomfort as directed by your health care provider.  Take antibiotic medicine as directed by your health care provider. Make sure you finish it even if you start to feel better. SEEK IMMEDIATE MEDICAL CARE IF:   You experience severe cramps in your stomach, back, or abdomen.  You have an unexplained temperature (make sure to record these temperatures).  You pass large clots or tissue (save these for your health care provider to inspect).  Your bleeding increases.  You become light-headed, weak, or have fainting episodes. MAKE SURE YOU:   Understand these instructions.  Will watch your condition.  Will get help right away if you are not doing well or get worse. Document Released: 10/26/2005 Document Revised: 03/12/2014 Document Reviewed:   05/25/2013 [employer] Patient Information 2015 [employer]. This information is not intended to replace advice given to you by your health care provider. Make sure you discuss any questions you have with your health care provider.  

## 2015-03-25 ENCOUNTER — Ambulatory Visit: Payer: Medicare Other

## 2015-03-26 ENCOUNTER — Encounter: Payer: Self-pay | Admitting: *Deleted

## 2015-04-01 ENCOUNTER — Encounter: Payer: Medicare Other | Admitting: Obstetrics and Gynecology

## 2015-04-10 ENCOUNTER — Encounter: Payer: Medicare Other | Admitting: Obstetrics and Gynecology

## 2015-04-16 ENCOUNTER — Encounter: Payer: Medicare Other | Admitting: Obstetrics & Gynecology

## 2015-06-18 ENCOUNTER — Emergency Department (HOSPITAL_COMMUNITY): Payer: Medicare Other

## 2015-06-18 ENCOUNTER — Encounter (HOSPITAL_COMMUNITY): Payer: Self-pay | Admitting: *Deleted

## 2015-06-18 ENCOUNTER — Emergency Department (HOSPITAL_COMMUNITY)
Admission: EM | Admit: 2015-06-18 | Discharge: 2015-06-18 | Disposition: A | Discharge status: Home / Self Care | Payer: Medicare Other | Attending: Emergency Medicine | Admitting: Emergency Medicine

## 2015-06-18 DIAGNOSIS — Z720 Tobacco use: Secondary | ICD-10-CM | POA: Insufficient documentation

## 2015-06-18 DIAGNOSIS — E119 Type 2 diabetes mellitus without complications: Secondary | ICD-10-CM | POA: Diagnosis not present

## 2015-06-18 DIAGNOSIS — Z8669 Personal history of other diseases of the nervous system and sense organs: Secondary | ICD-10-CM | POA: Insufficient documentation

## 2015-06-18 DIAGNOSIS — Z79899 Other long term (current) drug therapy: Secondary | ICD-10-CM | POA: Diagnosis not present

## 2015-06-18 DIAGNOSIS — Z3202 Encounter for pregnancy test, result negative: Secondary | ICD-10-CM | POA: Insufficient documentation

## 2015-06-18 DIAGNOSIS — N938 Other specified abnormal uterine and vaginal bleeding: Secondary | ICD-10-CM | POA: Diagnosis not present

## 2015-06-18 DIAGNOSIS — N939 Abnormal uterine and vaginal bleeding, unspecified: Secondary | ICD-10-CM

## 2015-06-18 LAB — WET PREP, GENITAL
Trich, Wet Prep: NONE SEEN
YEAST WET PREP: NONE SEEN

## 2015-06-18 LAB — CBC
HCT: 36 % (ref 36.0–46.0)
HEMOGLOBIN: 12.2 g/dL (ref 12.0–15.0)
MCH: 27.2 pg (ref 26.0–34.0)
MCHC: 33.9 g/dL (ref 30.0–36.0)
MCV: 80.4 fL (ref 78.0–100.0)
PLATELETS: 236 10*3/uL (ref 150–400)
RBC: 4.48 MIL/uL (ref 3.87–5.11)
RDW: 13.8 % (ref 11.5–15.5)
WBC: 7.1 10*3/uL (ref 4.0–10.5)

## 2015-06-18 LAB — I-STAT BETA HCG BLOOD, ED (MC, WL, AP ONLY): I-stat hCG, quantitative: <5 m[IU]/mL (ref <5)

## 2015-06-18 LAB — POC URINE PREG, ED: PREG TEST UR: NEGATIVE

## 2015-06-18 MED ORDER — MEGESTROL ACETATE 40 MG PO TABS: 40.0000 mg | ORAL_TABLET | Freq: Every day | ORAL | Status: DC

## 2015-06-18 NOTE — ED Notes (Signed)
Pt. Left with all belongings and refused wheelchair. Discharge instructions were reviewed and all questions answered

## 2015-06-18 NOTE — ED Notes (Signed)
Pelvic cart set up at bedside  

## 2015-06-18 NOTE — ED Provider Notes (Signed)
CSN: [id number]     Arrival date & time 06/18/15  1651 History  This chart was scribed for non-physician practitioner, Kerrie Buffalo, NP working with Bethann Berkshire, MD by Gwenyth Ober, ED scribe. This patient was seen in room TR01C/TR01C and the patient's care was started at 5:26 PM   Chief Complaint  Patient presents with  . Vaginal Bleeding   The history is provided by the patient. No language interpreter was used.   HPI Comments: Kathryn Mayo is a 29 y.o. female [phone], who presents to the Emergency Department complaining of intermittent, heavy vaginal bleeding that started 3 months ago. Pt states gray vaginal discharge, weakness and fatigue as associated symptoms. She reports onset of symptoms started after she had a miscarriage. The bleeding stopped for one week, but started again and became heavier two weeks ago after she had intercourse. Pt was seen at [facility] on 5/10 after a miscarriage, was diagnosed with BV, and was prescribed Flagyl. She had an [place] at that visit that showed an IUGS, but no yolk sac and no embryo. Her beta-HCG was 24401. Pt was advised to follow-up with GYN 2 weeks later, but did not. Pt is B negative and received the rhogam shot after her miscarriage. Pt's LNMP was 3/19. She does not use any form of birth control. Pt denies a history of abnormal periods or STI. She has 1 partner and has been with him for 13 years. Pt last had intercourse 2 weeks ago. She denies vaginal itching, dysuria and abdominal pain as associated symptoms.  Past Medical History  Diagnosis Date  . Diabetes mellitus without complication   . Neuropathy    Past Surgical History  Procedure Laterality Date  . Cesarean section      C/S x 2  . Wisdom tooth extraction     No family history on file. History  Substance Use Topics  . Smoking status: Current Every Day Smoker -- 0.25 packs/day    Types: Cigarettes  . Smokeless tobacco: Not on file  . Alcohol Use: No     Comment: 05/05/2013    OB History    Gravida Para Term Preterm AB TAB SAB Ectopic Multiple Living   7 3 2 1 3 2 1   2      Review of Systems  Gastrointestinal: Negative for abdominal pain.  Genitourinary: Positive for vaginal bleeding and vaginal discharge. Negative for dysuria.  All other systems reviewed and are negative.  Allergies  Shellfish allergy  Home Medications   Prior to Admission medications   Medication Sig Start Date End Date Taking? Authorizing Provider  ibuprofen (ADVIL,MOTRIN) 600 MG tablet Take 1 tablet (600 mg total) by mouth every 6 (six) hours as needed. 03/21/15   Marny Lowenstein, PA-C  metroNIDAZOLE (FLAGYL) 500 MG tablet Take 1 tablet (500 mg total) by mouth 2 (two) times daily. 03/19/15   Dorathy Kinsman, CNM  oxyCODONE-acetaminophen (PERCOCET/ROXICET) 5-325 MG per tablet Take 1 tablet by mouth every 6 (six) hours as needed for severe pain. 03/21/15   Marny Lowenstein, PA-C  Prenatal Vit-Fe Fumarate-FA (PRENATAL MULTIVITAMIN) TABS tablet Take 1 tablet by mouth daily at 12 noon.    Historical Provider, MD   BP 120/69 mmHg  Pulse 76  Temp(Src) 98.5 F (36.9 C) (Oral)  Resp 20  Ht 4\' 11"  (1.499 m)  Wt 183 lb (83.008 kg)  BMI 36.94 kg/m2  SpO2 100%  LMP 06/18/2015  Breastfeeding? Unknown Physical Exam  Constitutional: She appears well-developed and well-nourished.  No distress.  HENT:  Head: Normocephalic and atraumatic.  Eyes: Conjunctivae and EOM are normal.  Neck: Neck supple. No tracheal deviation present.  Cardiovascular: Normal rate, regular rhythm and normal heart sounds.   Pulmonary/Chest: Effort normal and breath sounds normal. No respiratory distress. She has no wheezes.  Abdominal: There is no tenderness. There is no CVA tenderness.  Genitourinary:  External genitalia without lesions, small amount of blood in the vaginal vault Mild cervical motion tenderness No adnexal tenderness No mass palpated Uterus slightly enlarged  Musculoskeletal: Normal range of motion.   Skin: Skin is warm and dry.  Psychiatric: She has a normal mood and affect. Her behavior is normal.  Nursing note and vitals reviewed.   ED Course  Procedures   DIAGNOSTIC STUDIES: Oxygen Saturation is 100% on RA, normal by my interpretation.    COORDINATION OF CARE: 5:50 PM Discussed treatment plan with pt which includes a pelvic exam. Pt agreed to plan.   Labs Review Results for orders placed or performed during the hospital encounter of 06/18/15 (from the past 24 hour(s))  CBC     Status: None   Collection Time: 06/18/15  5:43 PM  Result Value Ref Range   WBC 7.1 4.0 - 10.5 K/uL   RBC 4.48 3.87 - 5.11 MIL/uL   Hemoglobin 12.2 12.0 - 15.0 g/dL   HCT 16.1 09.6 - 04.5 %   MCV 80.4 78.0 - 100.0 fL   MCH 27.2 26.0 - 34.0 pg   MCHC 33.9 30.0 - 36.0 g/dL   RDW 40.9 81.1 - 91.4 %   Platelets 236 150 - 400 K/uL  Wet prep, genital     Status: Abnormal   Collection Time: 06/18/15  5:55 PM  Result Value Ref Range   Yeast Wet Prep HPF POC NONE SEEN NONE SEEN   Trich, Wet Prep NONE SEEN NONE SEEN   Clue Cells Wet Prep HPF POC FEW (A) NONE SEEN   WBC, Wet Prep HPF POC FEW (A) NONE SEEN  POC urine preg, ED (not at [facility])     Status: None   Collection Time: 06/18/15  7:27 PM  Result Value Ref Range   Preg Test, Ur NEGATIVE NEGATIVE  I-Stat Beta hCG blood, ED ([facility], [facility], [facility] only)     Status: None   Collection Time: 06/18/15  8:50 PM  Result Value Ref Range   I-stat hCG, quantitative <5.0 <5 mIU/mL   Comment 3            Imaging Review US Transvaginal Non-ob  06/18/2015   CLINICAL DATA:  Chronic vaginal bleeding.  Initial encounter.  EXAM: TRANSABDOMINAL AND TRANSVAGINAL ULTRASOUND OF PELVIS  TECHNIQUE: Both transabdominal and transvaginal ultrasound examinations of the pelvis were performed. Transabdominal technique was performed for global imaging of the pelvis including uterus, ovaries, adnexal regions, and pelvic cul-de-sac. It was necessary to proceed with endovaginal exam following  the transabdominal exam to visualize the endometrium.  COMPARISON:  Pelvic ultrasound performed 03/19/2015  FINDINGS: Uterus  Measurements: 8.5 x 6.3 x 6.3 cm. No fibroids or other mass visualized. Myometrial echogenicity is mildly heterogeneous.  Endometrium  Thickness: 1.0 cm.  No focal abnormality visualized.  Right ovary  Measurements: 1.8 x 1.5 x 1.6 cm. Normal appearance/no adnexal mass. Difficult to fully characterize due to its position.  Left ovary  Measurements: 2.5 x 1.5 x 2.4 cm. Normal appearance/no adnexal mass.  Other findings  No free fluid is seen within the pelvic cul-de-sac.  IMPRESSION: Unremarkable pelvic ultrasound.  No evidence for ovarian torsion. Mildly heterogeneous myometrial echogenicity, but the endometrial echo complex remains grossly unremarkable.   Electronically Signed   By: Roanna Raider M.D.   On: 06/18/2015 21:35   US Pelvis Complete  06/18/2015   CLINICAL DATA:  Chronic vaginal bleeding.  Initial encounter.  EXAM: TRANSABDOMINAL AND TRANSVAGINAL ULTRASOUND OF PELVIS  TECHNIQUE: Both transabdominal and transvaginal ultrasound examinations of the pelvis were performed. Transabdominal technique was performed for global imaging of the pelvis including uterus, ovaries, adnexal regions, and pelvic cul-de-sac. It was necessary to proceed with endovaginal exam following the transabdominal exam to visualize the endometrium.  COMPARISON:  Pelvic ultrasound performed 03/19/2015  FINDINGS: Uterus  Measurements: 8.5 x 6.3 x 6.3 cm. No fibroids or other mass visualized. Myometrial echogenicity is mildly heterogeneous.  Endometrium  Thickness: 1.0 cm.  No focal abnormality visualized.  Right ovary  Measurements: 1.8 x 1.5 x 1.6 cm. Normal appearance/no adnexal mass. Difficult to fully characterize due to its position.  Left ovary  Measurements: 2.5 x 1.5 x 2.4 cm. Normal appearance/no adnexal mass.  Other findings  No free fluid is seen within the pelvic cul-de-sac.  IMPRESSION: Unremarkable  pelvic ultrasound. No evidence for ovarian torsion. Mildly heterogeneous myometrial echogenicity, but the endometrial echo complex remains grossly unremarkable.   Electronically Signed   By: Roanna Raider M.D.   On: 06/18/2015 21:35     MDM  29 y.o. female with vaginal bleeding since SAB in April. Stable for d/c without heavy bleeding, she is not anemic and not orthostatic. Will start Megace for bleeding and she will follow up at [facility]. Discussed with the patient and all questioned fully answered.    Final diagnoses:  Vaginal bleeding   I personally performed the services described in this documentation, which was scribed in my presence. The recorded information has been reviewed and is accurate.      [place], [place] 06/18/15 2204  Bethann Berkshire, MD 06/19/15 [phone]

## 2015-06-18 NOTE — ED Notes (Signed)
Pt c/o of vaginal bleeding since April. Pt states that she had a miscarriage at that time.

## 2015-06-18 NOTE — Discharge Instructions (Signed)
Your ultrasound today is normal. We are starting you on a medication to stop the bleeding. You will need to follow up at [facility] for further evaluation of the bleeding.   Abnormal Uterine Bleeding Abnormal uterine bleeding can affect women at various stages in life, including teenagers, women in their reproductive years, pregnant women, and women who have reached menopause. Several kinds of uterine bleeding are considered abnormal, including:  Bleeding or spotting between periods.   Bleeding after sexual intercourse.   Bleeding that is heavier or more than normal.   Periods that last longer than usual.  Bleeding after menopause.  Many cases of abnormal uterine bleeding are minor and simple to treat, while others are more serious. Any type of abnormal bleeding should be evaluated by your health care provider. Treatment will depend on the cause of the bleeding. HOME CARE INSTRUCTIONS Monitor your condition for any changes. The following actions may help to alleviate any discomfort you are experiencing:  Avoid the use of tampons and douches as directed by your health care provider.  Change your pads frequently. You should get regular pelvic exams and Pap tests. Keep all follow-up appointments for diagnostic tests as directed by your health care provider.  SEEK MEDICAL CARE IF:   Your bleeding lasts more than 1 week.   You feel dizzy at times.  SEEK IMMEDIATE MEDICAL CARE IF:   You pass out.   You are changing pads every 15 to 30 minutes.   You have abdominal pain.  You have a fever.   You become sweaty or weak.   You are passing large blood clots from the vagina.   You start to feel nauseous and vomit. MAKE SURE YOU:   Understand these instructions.  Will watch your condition.  Will get help right away if you are not doing well or get worse. Document Released: 10/26/2005 Document Revised: 10/31/2013 Document Reviewed: 05/25/2013 [facility] Patient  Information 2015 [place], [place]. This information is not intended to replace advice given to you by your health care provider. Make sure you discuss any questions you have with your health care provider.

## 2015-06-19 LAB — GC/CHLAMYDIA PROBE AMP (~~LOC~~) NOT AT ARMC
Chlamydia: NEGATIVE
Neisseria Gonorrhea: NEGATIVE

## 2016-09-25 ENCOUNTER — Inpatient Hospital Stay (HOSPITAL_COMMUNITY)
Admission: AD | Admit: 2016-09-25 | Discharge: 2016-09-25 | Disposition: A | Discharge status: Home / Self Care | Payer: Medicare Other | Source: Ambulatory Visit | Attending: Obstetrics & Gynecology | Admitting: Obstetrics & Gynecology

## 2016-09-25 ENCOUNTER — Encounter (HOSPITAL_COMMUNITY): Payer: Self-pay | Admitting: *Deleted

## 2016-09-25 ENCOUNTER — Inpatient Hospital Stay (HOSPITAL_COMMUNITY): Payer: Medicare Other

## 2016-09-25 DIAGNOSIS — O26899 Other specified pregnancy related conditions, unspecified trimester: Secondary | ICD-10-CM

## 2016-09-25 DIAGNOSIS — O26891 Other specified pregnancy related conditions, first trimester: Secondary | ICD-10-CM

## 2016-09-25 DIAGNOSIS — O98811 Other maternal infectious and parasitic diseases complicating pregnancy, first trimester: Secondary | ICD-10-CM | POA: Insufficient documentation

## 2016-09-25 DIAGNOSIS — O24912 Unspecified diabetes mellitus in pregnancy, second trimester: Secondary | ICD-10-CM | POA: Insufficient documentation

## 2016-09-25 DIAGNOSIS — R102 Pelvic and perineal pain: Secondary | ICD-10-CM | POA: Insufficient documentation

## 2016-09-25 DIAGNOSIS — R109 Unspecified abdominal pain: Secondary | ICD-10-CM | POA: Diagnosis not present

## 2016-09-25 DIAGNOSIS — Z0389 Encounter for observation for other suspected diseases and conditions ruled out: Secondary | ICD-10-CM | POA: Diagnosis not present

## 2016-09-25 DIAGNOSIS — O99332 Smoking (tobacco) complicating pregnancy, second trimester: Secondary | ICD-10-CM | POA: Diagnosis not present

## 2016-09-25 DIAGNOSIS — A599 Trichomoniasis, unspecified: Secondary | ICD-10-CM

## 2016-09-25 DIAGNOSIS — Z3A01 Less than 8 weeks gestation of pregnancy: Secondary | ICD-10-CM | POA: Insufficient documentation

## 2016-09-25 DIAGNOSIS — Z830 Family history of human immunodeficiency virus [HIV] disease: Secondary | ICD-10-CM | POA: Insufficient documentation

## 2016-09-25 LAB — WET PREP, GENITAL
Clue Cells Wet Prep HPF POC: NONE SEEN
Sperm: NONE SEEN
Yeast Wet Prep HPF POC: NONE SEEN

## 2016-09-25 LAB — URINALYSIS, ROUTINE W REFLEX MICROSCOPIC
Bilirubin Urine: NEGATIVE
Glucose, UA: NEGATIVE mg/dL
Hgb urine dipstick: NEGATIVE
Ketones, ur: NEGATIVE mg/dL
NITRITE: NEGATIVE
Protein, ur: NEGATIVE mg/dL
Specific Gravity, Urine: 1.01 (ref 1.005–1.030)
pH: 7 (ref 5.0–8.0)

## 2016-09-25 LAB — CBC WITH DIFFERENTIAL/PLATELET
Basophils Absolute: 0 10*3/uL (ref 0.0–0.1)
Basophils Relative: 0 %
EOS ABS: 0 10*3/uL (ref 0.0–0.7)
EOS PCT: 1 %
HCT: 37.1 % (ref 36.0–46.0)
Hemoglobin: 12.7 g/dL (ref 12.0–15.0)
LYMPHS ABS: 2.6 10*3/uL (ref 0.7–4.0)
Lymphocytes Relative: 41 %
MCH: 26.9 pg (ref 26.0–34.0)
MCHC: 34.2 g/dL (ref 30.0–36.0)
MCV: 78.6 fL (ref 78.0–100.0)
MONOS PCT: 8 %
Monocytes Absolute: 0.5 10*3/uL (ref 0.1–1.0)
Neutro Abs: 3.3 10*3/uL (ref 1.7–7.7)
Neutrophils Relative %: 50 %
PLATELETS: 265 10*3/uL (ref 150–400)
RBC: 4.72 MIL/uL (ref 3.87–5.11)
RDW: 15.3 % (ref 11.5–15.5)
WBC: 6.4 10*3/uL (ref 4.0–10.5)

## 2016-09-25 LAB — POCT PREGNANCY, URINE: Preg Test, Ur: POSITIVE — AB

## 2016-09-25 LAB — URINE MICROSCOPIC-ADD ON

## 2016-09-25 LAB — HCG, QUANTITATIVE, PREGNANCY: HCG, BETA CHAIN, QUANT, S: 7796 m[IU]/mL — AB (ref <5)

## 2016-09-25 MED ORDER — METRONIDAZOLE 500 MG PO TABS
2000.0000 mg | ORAL_TABLET | Freq: Once | ORAL | Status: AC
  Administered 2016-09-25: 2000 mg via ORAL
  Filled 2016-09-25: qty 4

## 2016-09-25 MED ORDER — RHO D IMMUNE GLOBULIN 1500 UNIT/2ML IJ SOSY
300.0000 ug | PREFILLED_SYRINGE | Freq: Once | INTRAMUSCULAR | Status: AC
Start: 2016-09-25 — End: 2016-09-25
  Administered 2016-09-25: 300 ug via INTRAMUSCULAR
  Filled 2016-09-25: qty 2

## 2016-09-25 NOTE — Discharge Instructions (Signed)
Trichomonas Test The trichomonas test is done to diagnose trichomoniasis, an infection caused by an organism called Trichomonas. Trichomoniasis is a sexually transmitted infection (STI). In women, it causes vaginal infections. In men, it can cause the tube that carries urine (urethra) to become inflamed (urethritis). You may have this test as a part of a routine screening for STIs or if you have symptoms of trichomoniasis. To perform the test, your health care provider will take a sample of discharge. The sample is taken from the vagina or cervix in women and from the urethra in men. A urine sample can also be used for testing. RESULTS It is your responsibility to obtain your test results. Ask the lab or department performing the test when and how you will get your results. Contact your health care provider to discuss any questions you have about your results.  Meaning of Negative Test Results A negative test means you do not have trichomoniasis. Follow your health care provider's directions about any follow-up testing.  Meaning of Positive Test Results A positive test result means you have an active infection that needs to be treated with antibiotic medicine. All your current sexual partners must also be treated or it is likely you will get reinfected.  If your test is positive, your health care provider will start you on medicine and may advise you to:  Not have sexual intercourse until your infection has cleared up.  Use a latex condom properly every time you have sexual intercourse.  Limit the number of sexual partners you have. The more partners you have, the greater your risk of contracting trichomoniasis or another STI.  Tell all sexual partners about your infection so they can also be treated and to prevent reinfection. This information is not intended to replace advice given to you by your health care provider. Make sure you discuss any questions you have with your health care  provider. Document Released: 11/28/2004 Document Revised: 11/16/2014 Document Reviewed: 11/07/2013 [employer] Interactive Patient Education  2017 [employer]

## 2016-09-25 NOTE — MAU Provider Note (Signed)
History     CSN: [id number]  Arrival date and time: 09/25/16 4098   First Provider Initiated Contact with Patient 09/25/16 1011      Chief Complaint  Patient presents with  . Possible Pregnancy  . breast tenderness   HPI [name] is a 30 y.o. [phone] at [redacted]w[redacted]d who presents to MAU today with complaint of lower abdominal cramping and possible pregnancy. The patient states that she has had lower abdominal cramping rated at 3/10 now. She has not taken anything for pain. She states LMP 08/14/16. She noted spotting once 3 days ago, but none since. She has also had a thin, clear discharge. She denies UTI symptoms, fever, vomiting or diarrhea. She has had occasional mild nausea and constipation. Last BM was last night, but firm and difficult to pass.   OB History    Gravida Para Term Preterm AB Living   8 3 2 1 4 2    SAB TAB Ectopic Multiple Live Births   2 2     2       Past Medical History:  Diagnosis Date  . Diabetes mellitus without complication (HCC)    2 types of oral, 2 types of insulin- no longer taking.  was old A1C is normal  . Heart murmur    when young  . Infection    uti  . Neuropathy (HCC)   . Vaginal Pap smear, abnormal    age 30, doesn't think she has had one since    Past Surgical History:  Procedure Laterality Date  . CESAREAN SECTION     C/S x 2  . THERAPEUTIC ABORTION    . WISDOM TOOTH EXTRACTION      Family History  Problem Relation Age of Onset  . Asthma Mother   . Drug abuse Mother     died of overdose  . HIV/AIDS Mother     51  . Asthma Daughter   . Diabetes Maternal Grandmother   . Cancer Maternal Grandmother     breast    Social History  Substance Use Topics  . Smoking status: Current Every Day Smoker    Packs/day: 0.25    Years: 10.00    Types: Cigarettes, E-cigarettes  . Smokeless tobacco: Current User  . Alcohol use No     Comment: 05/05/2013    Allergies:  Allergies  Allergen Reactions  . Shellfish Allergy  Itching    No prescriptions prior to admission.    Review of Systems  Constitutional: Negative for fever and malaise/fatigue.  Gastrointestinal: Positive for abdominal pain, constipation and nausea. Negative for diarrhea and vomiting.  Genitourinary: Negative for dysuria, frequency and urgency.       Neg - vaginal bleeding, abnormal discharge   Physical Exam   Blood pressure 145/86, pulse 81, temperature 98.2 F (36.8 C), temperature source Oral, resp. rate 16, height 4' 11.75" (1.518 m), weight 199 lb 12.8 oz (90.6 kg), last menstrual period 08/14/2016, unknown if currently breastfeeding.  Physical Exam  Nursing note and vitals reviewed. Constitutional: She is oriented to person, place, and time. She appears well-developed and well-nourished. No distress.  HENT:  Head: Normocephalic and atraumatic.  Cardiovascular: Normal rate.   Respiratory: Effort normal.  GI: Soft. She exhibits no distension and no mass. There is no tenderness. There is no rebound and no guarding.  Genitourinary: Uterus is enlarged (slightly, exam limited by maternal body habitus). Uterus is not tender. Cervix exhibits no motion tenderness. Right adnexum displays no mass and no  tenderness. Left adnexum displays no mass and no tenderness. No bleeding in the vagina. No vaginal discharge found.  Genitourinary Comments: Cervix: closed, thick, firm, posterior  Neurological: She is alert and oriented to person, place, and time.  Skin: Skin is warm and dry. No erythema.  Psychiatric: She has a normal mood and affect.    Results for orders placed or performed during the hospital encounter of 09/25/16 (from the past 24 hour(s))  Urinalysis, Routine w reflex microscopic ([facility])     Status: Abnormal   Collection Time: 09/25/16  9:51 AM  Result Value Ref Range   Color, Urine YELLOW YELLOW   APPearance CLEAR CLEAR   Specific Gravity, Urine 1.010 1.005 - 1.030   pH 7.0 5.0 - 8.0   Glucose, UA NEGATIVE NEGATIVE  mg/dL   Hgb urine dipstick NEGATIVE NEGATIVE   Bilirubin Urine NEGATIVE NEGATIVE   Ketones, ur NEGATIVE NEGATIVE mg/dL   Protein, ur NEGATIVE NEGATIVE mg/dL   Nitrite NEGATIVE NEGATIVE   Leukocytes, UA TRACE (A) NEGATIVE  Urine microscopic-add on     Status: Abnormal   Collection Time: 09/25/16  9:51 AM  Result Value Ref Range   Squamous Epithelial / LPF 0-5 (A) NONE SEEN   WBC, UA 0-5 0 - 5 WBC/hpf   RBC / HPF 0-5 0 - 5 RBC/hpf   Bacteria, UA FEW (A) NONE SEEN   Urine-Other TRICHOMONAS PRESENT   Pregnancy, urine POC     Status: Abnormal   Collection Time: 09/25/16  9:57 AM  Result Value Ref Range   Preg Test, Ur POSITIVE (A) NEGATIVE  Wet prep, genital     Status: Abnormal   Collection Time: 09/25/16 10:05 AM  Result Value Ref Range   Yeast Wet Prep HPF POC NONE SEEN NONE SEEN   Trich, Wet Prep PRESENT (A) NONE SEEN   Clue Cells Wet Prep HPF POC NONE SEEN NONE SEEN   WBC, Wet Prep HPF POC FEW (A) NONE SEEN   Sperm NONE SEEN   CBC with Differential/Platelet     Status: None   Collection Time: 09/25/16 10:35 AM  Result Value Ref Range   WBC 6.4 4.0 - 10.5 K/uL   RBC 4.72 3.87 - 5.11 MIL/uL   Hemoglobin 12.7 12.0 - 15.0 g/dL   HCT 16.137.1 09.636.0 - 04.546.0 %   MCV 78.6 78.0 - 100.0 fL   MCH 26.9 26.0 - 34.0 pg   MCHC 34.2 30.0 - 36.0 g/dL   RDW 40.915.3 81.111.5 - 91.415.5 %   Platelets 265 150 - 400 K/uL   Neutrophils Relative % 50 %   Neutro Abs 3.3 1.7 - 7.7 K/uL   Lymphocytes Relative 41 %   Lymphs Abs 2.6 0.7 - 4.0 K/uL   Monocytes Relative 8 %   Monocytes Absolute 0.5 0.1 - 1.0 K/uL   Eosinophils Relative 1 %   Eosinophils Absolute 0.0 0.0 - 0.7 K/uL   Basophils Relative 0 %   Basophils Absolute 0.0 0.0 - 0.1 K/uL  hCG, quantitative, pregnancy     Status: Abnormal   Collection Time: 09/25/16 10:35 AM  Result Value Ref Range   hCG, Beta Chain, Quant, S 7,796 (H) <5 mIU/mL  Rh IG workup (includes ABO/Rh)     Status: None (Preliminary result)   Collection Time: 09/25/16 10:35 AM   Result Value Ref Range   Gestational Age(Wks) 6.0    ABO/RH(D) B NEG    Antibody Screen NEG    Unit Number [id number]  Blood Component Type RHIG    Unit division 00    Status of Unit ISSUED    Transfusion Status OK TO TRANSFUSE    [place] Ob Comp Less 14 Wks  Result Date: 09/25/2016 CLINICAL DATA:  Pregnant, pain EXAM:  .  .: EXAM:  . Trace pelvic fluid. OBSTETRIC <14 WK US AND TRANSVAGINAL OB US TECHNIQUE: Both transabdominal and transvaginal ultrasound examinations were performed for complete evaluation of the gestation as well as the maternal uterus, adnexal regions, and pelvic cul-de-sac. Transvaginal technique was performed to assess early pregnancy. COMPARISON:  None. FINDINGS: Intrauterine gestational sac: Single Yolk sac:  Visualized. Embryo:  Visualized. Cardiac Activity: Visualized. Heart Rate: 94  bpm CRL:  2.9  mm   5 w   5 d                  US EDC: 05/23/2017 Subchorionic hemorrhage:  None visualized. Maternal uterus/adnexae: Bilateral ovaries are within normal limits. Trace pelvic fluid, likely physiologic. IMPRESSION: Single intrauterine gestation, measuring 5 weeks 5 days by crown-rump length. Electronically Signed   By: Charline BillsSriyesh  Krishnan M.D.   On: 09/25/2016 11:48   [place] Ob Transvaginal  Result Date: 09/25/2016 CLINICAL DATA:  Pregnant, pain EXAM:  .  .: EXAM:  . Trace pelvic fluid. OBSTETRIC <14 WK US AND TRANSVAGINAL OB US TECHNIQUE: Both transabdominal and transvaginal ultrasound examinations were performed for complete evaluation of the gestation as well as the maternal uterus, adnexal regions, and pelvic cul-de-sac. Transvaginal technique was performed to assess early pregnancy. COMPARISON:  None. FINDINGS: Intrauterine gestational sac: Single Yolk sac:  Visualized. Embryo:  Visualized. Cardiac Activity: Visualized. Heart Rate: 94  bpm CRL:  2.9  mm   5 w   5 d                  US EDC: 05/23/2017 Subchorionic hemorrhage:  None visualized. Maternal uterus/adnexae: Bilateral  ovaries are within normal limits. Trace pelvic fluid, likely physiologic. IMPRESSION: Single intrauterine gestation, measuring 5 weeks 5 days by crown-rump length. Electronically Signed   By: Charline BillsSriyesh  Krishnan M.D.   On: 09/25/2016 11:48    MAU Course  Procedures None  MDM +UPT UA, wet prep, GC/chlamydia, CBC, quant hCG, HIV, RPR and US today to rule out ectopic pregnancy + trichomonas. Treat with Flagyl in MAU today Patient states currently diet controlled. Per patient PCP states Hgb A1c is normal. CBG today 73 Assessment and Plan  A: SIUP at 3953w0d Trichomonas   P: Discharge home Treated with Flagyl 2G in MAU today Partner treatment advised  First trimester precautions discussed Patient advised to follow-up with [facility] to start prenatal care. They will call her with an appointment.  Pregnancy confirmation letter given Patient may return to MAU as needed or if her condition were to change or worsen   Marny LowensteinJulie N Rondy Krupinski, PA-C  09/25/2016, 12:58 PM

## 2016-09-25 NOTE — MAU Note (Addendum)
Period didn't come on.  Usually has a 32 day cycle. PT on the 10th- faint line read at an hour.  Having symptoms (cramping and breast tenderness). Some spotting 2 days ago. 2 prior miscarriages.  bneg blood type.  Has"gotten so big, twins run in her family and she is a vegetarian", something has to be going on .

## 2016-09-26 LAB — RH IG WORKUP (INCLUDES ABO/RH)
ABO/RH(D): B NEG
ANTIBODY SCREEN: NEGATIVE
Gestational Age(Wks): 6
UNIT DIVISION: 0

## 2016-09-26 LAB — RPR: RPR Ser Ql: NONREACTIVE

## 2016-09-26 LAB — HIV ANTIBODY (ROUTINE TESTING W REFLEX): HIV SCREEN 4TH GENERATION: NONREACTIVE

## 2016-09-28 LAB — GLUCOSE, CAPILLARY: Glucose-Capillary: 73 mg/dL (ref 65–99)

## 2016-09-28 LAB — GC/CHLAMYDIA PROBE AMP (~~LOC~~) NOT AT ARMC
CHLAMYDIA, DNA PROBE: NEGATIVE
Neisseria Gonorrhea: NEGATIVE

## 2016-10-12 ENCOUNTER — Other Ambulatory Visit: Payer: Medicare Other

## 2016-10-21 ENCOUNTER — Inpatient Hospital Stay (HOSPITAL_COMMUNITY)
Admission: AD | Admit: 2016-10-21 | Discharge: 2016-10-22 | Disposition: A | Discharge status: Home / Self Care | Payer: Medicare Other | Source: Ambulatory Visit | Attending: Obstetrics & Gynecology | Admitting: Obstetrics & Gynecology

## 2016-10-21 DIAGNOSIS — O209 Hemorrhage in early pregnancy, unspecified: Secondary | ICD-10-CM

## 2016-10-21 DIAGNOSIS — Z3490 Encounter for supervision of normal pregnancy, unspecified, unspecified trimester: Secondary | ICD-10-CM

## 2016-10-21 DIAGNOSIS — Z3A09 9 weeks gestation of pregnancy: Secondary | ICD-10-CM

## 2016-10-21 DIAGNOSIS — O99331 Smoking (tobacco) complicating pregnancy, first trimester: Secondary | ICD-10-CM | POA: Insufficient documentation

## 2016-10-21 DIAGNOSIS — F17210 Nicotine dependence, cigarettes, uncomplicated: Secondary | ICD-10-CM | POA: Insufficient documentation

## 2016-10-21 DIAGNOSIS — E119 Type 2 diabetes mellitus without complications: Secondary | ICD-10-CM | POA: Insufficient documentation

## 2016-10-21 DIAGNOSIS — O24111 Pre-existing diabetes mellitus, type 2, in pregnancy, first trimester: Secondary | ICD-10-CM | POA: Insufficient documentation

## 2016-10-22 ENCOUNTER — Encounter (HOSPITAL_COMMUNITY): Payer: Self-pay

## 2016-10-22 ENCOUNTER — Inpatient Hospital Stay (HOSPITAL_COMMUNITY): Payer: Medicare Other

## 2016-10-22 DIAGNOSIS — O209 Hemorrhage in early pregnancy, unspecified: Secondary | ICD-10-CM | POA: Diagnosis present

## 2016-10-22 DIAGNOSIS — F17210 Nicotine dependence, cigarettes, uncomplicated: Secondary | ICD-10-CM | POA: Diagnosis not present

## 2016-10-22 DIAGNOSIS — O24111 Pre-existing diabetes mellitus, type 2, in pregnancy, first trimester: Secondary | ICD-10-CM | POA: Diagnosis not present

## 2016-10-22 DIAGNOSIS — O99331 Smoking (tobacco) complicating pregnancy, first trimester: Secondary | ICD-10-CM | POA: Diagnosis not present

## 2016-10-22 DIAGNOSIS — E119 Type 2 diabetes mellitus without complications: Secondary | ICD-10-CM | POA: Diagnosis not present

## 2016-10-22 DIAGNOSIS — Z3A09 9 weeks gestation of pregnancy: Secondary | ICD-10-CM | POA: Diagnosis not present

## 2016-10-22 LAB — URINALYSIS, ROUTINE W REFLEX MICROSCOPIC
Bilirubin Urine: NEGATIVE
GLUCOSE, UA: NEGATIVE mg/dL
Ketones, ur: NEGATIVE mg/dL
Leukocytes, UA: NEGATIVE
Nitrite: NEGATIVE
PH: 7 (ref 5.0–8.0)
PROTEIN: NEGATIVE mg/dL
Specific Gravity, Urine: 1.016 (ref 1.005–1.030)
WBC UA: NONE SEEN WBC/hpf (ref 0–5)

## 2016-10-22 NOTE — MAU Note (Signed)
Pt reports onset of bright red bleeding tonight. Denies pain.

## 2016-10-22 NOTE — MAU Provider Note (Signed)
History     CSN: [id number]  Arrival date and time: 10/21/16 2350   First Provider Initiated Contact with Patient 10/22/16 0034      Chief Complaint  Patient presents with  . Vaginal Bleeding   Vaginal Bleeding  The patient's primary symptoms include vaginal bleeding. This is a new problem. Episode onset: about an hour ago.  The problem occurs constantly. The problem has been unchanged. The patient is experiencing no pain. She is pregnant. Associated symptoms include constipation, nausea and vomiting. Pertinent negatives include no abdominal pain, chills, diarrhea, dysuria, fever, frequency or urgency. The vaginal discharge was bloody. The vaginal bleeding is typical of menses. She has not been passing clots. She has not been passing tissue. Nothing aggravates the symptoms. She has tried nothing for the symptoms. Menstrual history: LMP 08/14/16    Past Medical History:  Diagnosis Date  . Diabetes mellitus without complication (HCC)    2 types of oral, 2 types of insulin- no longer taking.  was old A1C is normal  . Heart murmur    when young  . Infection    uti  . Neuropathy (HCC)   . Vaginal Pap smear, abnormal    age 30, doesn't think she has had one since    Past Surgical History:  Procedure Laterality Date  . CESAREAN SECTION     C/S x 2  . THERAPEUTIC ABORTION    . WISDOM TOOTH EXTRACTION      Family History  Problem Relation Age of Onset  . Asthma Mother   . Drug abuse Mother     died of overdose  . HIV/AIDS Mother     [id number]  . Asthma Daughter   . Diabetes Maternal Grandmother   . Cancer Maternal Grandmother     breast    Social History  Substance Use Topics  . Smoking status: Current Every Day Smoker    Packs/day: 0.25    Years: 10.00    Types: Cigarettes, E-cigarettes  . Smokeless tobacco: Current User  . Alcohol use No     Comment: 05/05/2013    Allergies:  Allergies  Allergen Reactions  . Shellfish Allergy Itching    Prescriptions Prior to  Admission  Medication Sig Dispense Refill Last Dose  . Cyanocobalamin (B-12 PO) Take 1 tablet by mouth daily.   09/25/2016 at Unknown time  . Prenatal Vit-Fe Fumarate-FA (PRENATAL MULTIVITAMIN) TABS tablet Take 1 tablet by mouth daily at 12 noon.   09/25/2016 at Unknown time    Review of Systems  Constitutional: Negative for chills and fever.  Gastrointestinal: Positive for constipation, nausea and vomiting. Negative for abdominal pain and diarrhea.  Genitourinary: Positive for vaginal bleeding. Negative for dysuria, frequency and urgency.   Physical Exam   Blood pressure 127/94, pulse 86, temperature 98.2 F (36.8 C), temperature source Oral, resp. rate 17, height 4\' 11"  (1.499 m), weight 208 lb (94.3 kg), last menstrual period 08/14/2016, SpO2 99 %, unknown if currently breastfeeding.  Physical Exam  Nursing note and vitals reviewed. Constitutional: She is oriented to person, place, and time. She appears well-developed and well-nourished. No distress.  HENT:  Head: Normocephalic.  Cardiovascular: Normal rate.   Respiratory: Effort normal.  GI: Soft. There is no tenderness. There is no rebound.  Neurological: She is alert and oriented to person, place, and time.  Skin: Skin is warm and dry.  Psychiatric: She has a normal mood and affect.    Results for orders placed or performed during the hospital encounter  of 10/21/16 (from the past 24 hour(s))  Urinalysis, Routine w reflex microscopic     Status: Abnormal   Collection Time: 10/22/16 12:20 AM  Result Value Ref Range   Color, Urine YELLOW YELLOW   APPearance CLOUDY (A) CLEAR   Specific Gravity, Urine 1.016 1.005 - 1.030   pH 7.0 5.0 - 8.0   Glucose, UA NEGATIVE NEGATIVE mg/dL   Hgb urine dipstick MODERATE (A) NEGATIVE   Bilirubin Urine NEGATIVE NEGATIVE   Ketones, ur NEGATIVE NEGATIVE mg/dL   Protein, ur NEGATIVE NEGATIVE mg/dL   Nitrite NEGATIVE NEGATIVE   Leukocytes, UA NEGATIVE NEGATIVE   RBC / HPF 0-5 0 - 5 RBC/hpf    WBC, UA NONE SEEN 0 - 5 WBC/hpf   Bacteria, UA RARE (A) NONE SEEN   Squamous Epithelial / LPF 0-5 (A) NONE SEEN   Mucous PRESENT    [place] Ob Comp Less 14 Wks  Result Date: 10/22/2016 CLINICAL DATA:  30 year old pregnant female with vaginal bleeding. EXAM: OBSTETRIC <14 WK ULTRASOUND TECHNIQUE: Transabdominal ultrasound was performed for evaluation of the gestation as well as the maternal uterus and adnexal regions. COMPARISON:  Pelvic ultrasound dated 09/25/2016 FINDINGS: Intrauterine gestational sac: Single intrauterine gestational sac. Yolk sac:  Seen Embryo:  Present Cardiac Activity: Detected Heart Rate: 165 bpm CRL:   24  mm   9 w 1 d                  US EDC: 05/26/2017 The gestational age based on first ultrasound is 9 weeks, 4 days. Subchorionic hemorrhage:  None visualized. Maternal uterus/adnexae: The maternal ovaries appear unremarkable. IMPRESSION: Single live intrauterine pregnancy with an estimated gestational age of [redacted] weeks, 4 days based on the first ultrasound. Electronically Signed   By: Elgie CollardArash  Radparvar M.D.   On: 10/22/2016 01:43    MAU Course  Procedures  MDM Bedside US attempted, unable to visualize fetal pole due to poor image quality.  Assessment and Plan   1. [redacted] weeks gestation of pregnancy   2. First trimester bleeding   3. Intrauterine pregnancy    DC home Comfort measures reviewed  1st Trimester precautions  Bleeding precautions RX: none  Return to MAU as needed FU with OB as planned  Follow-up Information    [facility]   Specialty:  Obstetrics and Gynecology Contact information: [address] [place] [id number] [phone]           Tawnya CrookHogan, Heather Donovan 10/22/2016, 12:35 AM

## 2016-10-22 NOTE — Discharge Instructions (Signed)
First Trimester of Pregnancy  The first trimester of pregnancy is from week 1 until the end of week 12 (months 1 through 3). A week after a sperm fertilizes an egg, the egg will implant on the wall of the uterus. This embryo will begin to develop into a baby. Genes from you and your partner are forming the baby. The female genes determine whether the baby is a boy or a girl. At 6-8 weeks, the eyes and face are formed, and the heartbeat can be seen on ultrasound. At the end of 12 weeks, all the baby's organs are formed.   Now that you are pregnant, you will want to do everything you can to have a healthy baby. Two of the most important things are to get good prenatal care and to follow your health care provider's instructions. Prenatal care is all the medical care you receive before the baby's birth. This care will help prevent, find, and treat any problems during the pregnancy and childbirth.  BODY CHANGES  Your body goes through many changes during pregnancy. The changes vary from woman to woman.   · You may gain or lose a couple of pounds at first.  · You may feel sick to your stomach (nauseous) and throw up (vomit). If the vomiting is uncontrollable, call your health care provider.  · You may tire easily.  · You may develop headaches that can be relieved by medicines approved by your health care provider.  · You may urinate more often. Painful urination may mean you have a bladder infection.  · You may develop heartburn as a result of your pregnancy.  · You may develop constipation because certain hormones are causing the muscles that push waste through your intestines to slow down.  · You may develop hemorrhoids or swollen, bulging veins (varicose veins).  · Your breasts may begin to grow larger and become tender. Your nipples may stick out more, and the tissue that surrounds them (areola) may become darker.  · Your gums may bleed and may be sensitive to brushing and flossing.   · Dark spots or blotches (chloasma, mask of pregnancy) may develop on your face. This will likely fade after the baby is born.  · Your menstrual periods will stop.  · You may have a loss of appetite.  · You may develop cravings for certain kinds of food.  · You may have changes in your emotions from day to day, such as being excited to be pregnant or being concerned that something may go wrong with the pregnancy and baby.  · You may have more vivid and strange dreams.  · You may have changes in your hair. These can include thickening of your hair, rapid growth, and changes in texture. Some women also have hair loss during or after pregnancy, or hair that feels dry or thin. Your hair will most likely return to normal after your baby is born.  WHAT TO EXPECT AT YOUR PRENATAL VISITS  During a routine prenatal visit:  · You will be weighed to make sure you and the baby are growing normally.  · Your blood pressure will be taken.  · Your abdomen will be measured to track your baby's growth.  · The fetal heartbeat will be listened to starting around week 10 or 12 of your pregnancy.  · Test results from any previous visits will be discussed.  Your health care provider may ask you:  · How you are feeling.  · If you   are feeling the baby move.  · If you have had any abnormal symptoms, such as leaking fluid, bleeding, severe headaches, or abdominal cramping.  · If you are using any tobacco products, including cigarettes, chewing tobacco, and electronic cigarettes.  · If you have any questions.  Other tests that may be performed during your first trimester include:  · Blood tests to find your blood type and to check for the presence of any previous infections. They will also be used to check for low iron levels (anemia) and Rh antibodies. Later in the pregnancy, blood tests for diabetes will be done along with other tests if problems develop.  · Urine tests to check for infections, diabetes, or protein in the urine.   · An ultrasound to confirm the proper growth and development of the baby.  · An amniocentesis to check for possible genetic problems.  · Fetal screens for spina bifida and Down syndrome.  · You may need other tests to make sure you and the baby are doing well.  · HIV (human immunodeficiency virus) testing. Routine prenatal testing includes screening for HIV, unless you choose not to have this test.  HOME CARE INSTRUCTIONS   Medicines  · Follow your health care provider's instructions regarding medicine use. Specific medicines may be either safe or unsafe to take during pregnancy.  · Take your prenatal vitamins as directed.  · If you develop constipation, try taking a stool softener if your health care provider approves.  Diet  · Eat regular, well-balanced meals. Choose a variety of foods, such as meat or vegetable-based protein, fish, milk and low-fat dairy products, vegetables, fruits, and whole grain breads and cereals. Your health care provider will help you determine the amount of weight gain that is right for you.  · Avoid raw meat and uncooked cheese. These carry germs that can cause birth defects in the baby.  · Eating four or five small meals rather than three large meals a day may help relieve nausea and vomiting. If you start to feel nauseous, eating a few soda crackers can be helpful. Drinking liquids between meals instead of during meals also seems to help nausea and vomiting.  · If you develop constipation, eat more high-fiber foods, such as fresh vegetables or fruit and whole grains. Drink enough fluids to keep your urine clear or pale yellow.  Activity and Exercise  · Exercise only as directed by your health care provider. Exercising will help you:    Control your weight.    Stay in shape.    Be prepared for labor and delivery.  · Experiencing pain or cramping in the lower abdomen or low back is a good sign that you should stop exercising. Check with your health care provider  before continuing normal exercises.  · Try to avoid standing for long periods of time. Move your legs often if you must stand in one place for a long time.  · Avoid heavy lifting.  · Wear low-heeled shoes, and practice good posture.  · You may continue to have sex unless your health care provider directs you otherwise.  Relief of Pain or Discomfort  · Wear a good support bra for breast tenderness.    · Take warm sitz baths to soothe any pain or discomfort caused by hemorrhoids. Use hemorrhoid cream if your health care provider approves.    · Rest with your legs elevated if you have leg cramps or low back pain.  · If you develop varicose veins in your   legs, wear support hose. Elevate your feet for 15 minutes, 3-4 times a day. Limit salt in your diet.  Prenatal Care  · Schedule your prenatal visits by the twelfth week of pregnancy. They are usually scheduled monthly at first, then more often in the last 2 months before delivery.  · Write down your questions. Take them to your prenatal visits.  · Keep all your prenatal visits as directed by your health care provider.  Safety  · Wear your seat belt at all times when driving.  · Make a list of emergency phone numbers, including numbers for family, friends, the hospital, and police and fire departments.  General Tips  · Ask your health care provider for a referral to a local prenatal education class. Begin classes no later than at the beginning of month 6 of your pregnancy.  · Ask for help if you have counseling or nutritional needs during pregnancy. Your health care provider can offer advice or refer you to specialists for help with various needs.  · Do not use hot tubs, steam rooms, or saunas.  · Do not douche or use tampons or scented sanitary pads.  · Do not cross your legs for long periods of time.  · Avoid cat litter boxes and soil used by cats. These carry germs that can cause birth defects in the baby and possibly loss of the fetus by miscarriage or stillbirth.   · Avoid all smoking, herbs, alcohol, and medicines not prescribed by your health care provider. Chemicals in these affect the formation and growth of the baby.  · Do not use any tobacco products, including cigarettes, chewing tobacco, and electronic cigarettes. If you need help quitting, ask your health care provider. You may receive counseling support and other resources to help you quit.  · Schedule a dentist appointment. At home, brush your teeth with a soft toothbrush and be gentle when you floss.  SEEK MEDICAL CARE IF:   · You have dizziness.  · You have mild pelvic cramps, pelvic pressure, or nagging pain in the abdominal area.  · You have persistent nausea, vomiting, or diarrhea.  · You have a bad smelling vaginal discharge.  · You have pain with urination.  · You notice increased swelling in your face, hands, legs, or ankles.  SEEK IMMEDIATE MEDICAL CARE IF:   · You have a fever.  · You are leaking fluid from your vagina.  · You have spotting or bleeding from your vagina.  · You have severe abdominal cramping or pain.  · You have rapid weight gain or loss.  · You vomit blood or material that looks like coffee grounds.  · You are exposed to German measles and have never had them.  · You are exposed to fifth disease or chickenpox.  · You develop a severe headache.  · You have shortness of breath.  · You have any kind of trauma, such as from a fall or a car accident.     This information is not intended to replace advice given to you by your health care provider. Make sure you discuss any questions you have with your health care provider.     Document Released: 10/20/2001 Document Revised: 11/16/2014 Document Reviewed: 09/05/2013  Elsevier Interactive Patient Education ©2017 [employer]

## 2016-10-26 ENCOUNTER — Encounter: Payer: Self-pay | Admitting: Obstetrics & Gynecology

## 2016-10-26 ENCOUNTER — Ambulatory Visit (INDEPENDENT_AMBULATORY_CARE_PROVIDER_SITE_OTHER): Payer: Medicare Other | Admitting: Obstetrics & Gynecology

## 2016-10-26 ENCOUNTER — Telehealth: Payer: Self-pay | Admitting: General Practice

## 2016-10-26 ENCOUNTER — Other Ambulatory Visit (HOSPITAL_COMMUNITY)
Admission: RE | Admit: 2016-10-26 | Discharge: 2016-10-26 | Disposition: A | Discharge status: Home / Self Care | Payer: Medicare Other | Source: Ambulatory Visit | Attending: Obstetrics & Gynecology | Admitting: Obstetrics & Gynecology

## 2016-10-26 ENCOUNTER — Institutional Professional Consult (permissible substitution): Payer: Self-pay

## 2016-10-26 VITALS — BP 115/64 | HR 79 | Wt 206.4 lb

## 2016-10-26 DIAGNOSIS — F329 Major depressive disorder, single episode, unspecified: Secondary | ICD-10-CM

## 2016-10-26 DIAGNOSIS — K089 Disorder of teeth and supporting structures, unspecified: Secondary | ICD-10-CM

## 2016-10-26 DIAGNOSIS — K029 Dental caries, unspecified: Secondary | ICD-10-CM

## 2016-10-26 DIAGNOSIS — Z1151 Encounter for screening for human papillomavirus (HPV): Secondary | ICD-10-CM | POA: Diagnosis present

## 2016-10-26 DIAGNOSIS — F4310 Post-traumatic stress disorder, unspecified: Secondary | ICD-10-CM

## 2016-10-26 DIAGNOSIS — Z01411 Encounter for gynecological examination (general) (routine) with abnormal findings: Secondary | ICD-10-CM | POA: Diagnosis present

## 2016-10-26 DIAGNOSIS — O99341 Other mental disorders complicating pregnancy, first trimester: Secondary | ICD-10-CM

## 2016-10-26 DIAGNOSIS — O24119 Pre-existing diabetes mellitus, type 2, in pregnancy, unspecified trimester: Secondary | ICD-10-CM

## 2016-10-26 DIAGNOSIS — O99340 Other mental disorders complicating pregnancy, unspecified trimester: Secondary | ICD-10-CM

## 2016-10-26 DIAGNOSIS — O24111 Pre-existing diabetes mellitus, type 2, in pregnancy, first trimester: Secondary | ICD-10-CM | POA: Diagnosis not present

## 2016-10-26 DIAGNOSIS — R5383 Other fatigue: Secondary | ICD-10-CM

## 2016-10-26 DIAGNOSIS — O0990 Supervision of high risk pregnancy, unspecified, unspecified trimester: Secondary | ICD-10-CM | POA: Insufficient documentation

## 2016-10-26 DIAGNOSIS — O99211 Obesity complicating pregnancy, first trimester: Secondary | ICD-10-CM

## 2016-10-26 LAB — POCT URINALYSIS DIP (DEVICE)
Bilirubin Urine: NEGATIVE
GLUCOSE, UA: NEGATIVE mg/dL
Hgb urine dipstick: NEGATIVE
Leukocytes, UA: NEGATIVE
NITRITE: NEGATIVE
PROTEIN: NEGATIVE mg/dL
Specific Gravity, Urine: 1.025 (ref 1.005–1.030)
UROBILINOGEN UA: 1 mg/dL (ref 0.0–1.0)
pH: 6.5 (ref 5.0–8.0)

## 2016-10-26 LAB — COMPREHENSIVE METABOLIC PANEL
ALT: 13 U/L (ref 6–29)
AST: 14 U/L (ref 10–30)
Albumin: 3.7 g/dL (ref 3.6–5.1)
Alkaline Phosphatase: 37 U/L (ref 33–115)
BILIRUBIN TOTAL: 0.2 mg/dL (ref 0.2–1.2)
BUN: 8 mg/dL (ref 7–25)
CALCIUM: 9.2 mg/dL (ref 8.6–10.2)
CHLORIDE: 105 mmol/L (ref 98–110)
CO2: 21 mmol/L (ref 20–31)
Creat: 0.58 mg/dL (ref 0.50–1.10)
GLUCOSE: 82 mg/dL (ref 65–99)
Potassium: 4.1 mmol/L (ref 3.5–5.3)
SODIUM: 136 mmol/L (ref 135–146)
Total Protein: 6.6 g/dL (ref 6.1–8.1)

## 2016-10-26 LAB — GLUCOSE, CAPILLARY: GLUCOSE-CAPILLARY: 88 mg/dL (ref 65–99)

## 2016-10-26 MED ORDER — ASPIRIN EC 81 MG PO TBEC: DELAYED_RELEASE_TABLET | ORAL | 10 refills | Status: DC

## 2016-10-26 NOTE — Progress Notes (Signed)
Here for first visit. Given new pregnancy information. States no further bleeding since she came to [facility]. States took the medicine for trich, but still having same discharge. States partner was tested negative, so he wasn't treated. States was on insulin and oral meds- but took herself off then about 2010. States since then has dieted, exercised and went back to doctor and he didn't prescribe anything. Declines flu shot .

## 2016-10-26 NOTE — Telephone Encounter (Signed)
Per Dr Penne LashLeggett, patient needs to be referred to a dentist, fetal echo, & opthalmology. [facility] with [facility] @ 930am, fetal [facility] 2/19 @ 830am, and [facility] @ 10am. Called and informed patient of all appts. Patient verbalized understanding to all and asked about baby aspirin. Told patient the Rx went to her pharmacy. Patient verbalized understanding & had no questions

## 2016-10-27 DIAGNOSIS — F4310 Post-traumatic stress disorder, unspecified: Secondary | ICD-10-CM | POA: Insufficient documentation

## 2016-10-27 DIAGNOSIS — O99210 Obesity complicating pregnancy, unspecified trimester: Secondary | ICD-10-CM | POA: Insufficient documentation

## 2016-10-27 DIAGNOSIS — K089 Disorder of teeth and supporting structures, unspecified: Secondary | ICD-10-CM | POA: Insufficient documentation

## 2016-10-27 DIAGNOSIS — O99340 Other mental disorders complicating pregnancy, unspecified trimester: Secondary | ICD-10-CM

## 2016-10-27 DIAGNOSIS — O24119 Pre-existing diabetes mellitus, type 2, in pregnancy, unspecified trimester: Secondary | ICD-10-CM | POA: Insufficient documentation

## 2016-10-27 DIAGNOSIS — F329 Major depressive disorder, single episode, unspecified: Secondary | ICD-10-CM | POA: Insufficient documentation

## 2016-10-27 DIAGNOSIS — F32A Depression, unspecified: Secondary | ICD-10-CM | POA: Insufficient documentation

## 2016-10-27 LAB — WET PREP, GENITAL
TRICH WET PREP: NONE SEEN
YEAST WET PREP: NONE SEEN

## 2016-10-27 LAB — TSH: TSH: 1.89 mIU/L

## 2016-10-27 LAB — HEMOGLOBIN A1C
HEMOGLOBIN A1C: 5.5 % (ref <5.7)
MEAN PLASMA GLUCOSE: 111 mg/dL

## 2016-10-27 NOTE — Progress Notes (Signed)
Subjective:    Kathryn Mayo is a [id number] 6269w4d being seen today for her first obstetrical visit.  Her obstetrical history is significant for large for gestational age, macrosomia, non-compliance, obesity, smoker and Type 2 DM not well controlled (pt thinks last hgb A1C is 12, depression, PTSD on disability, THC abuse.  Patient reports no complaints.  Vitals:   10/26/16 0946  BP: 115/64  Pulse: 79  Weight: 206 lb 6.4 oz (93.6 kg)    HISTORY: OB History  Gravida Para Term Preterm AB Living  8 3 2 1 4 2   SAB TAB Ectopic Multiple Live Births  2 2     2     # Outcome Date GA Lbr Len/2nd Weight Sex Delivery Anes PTL Lv  8 Current           7 SAB 2016          6 SAB 2015          5 TAB 2008          4 Term 2007    F CS-Unspec Spinal  LIV  3 Term 2005    M CS-Unspec   LIV  2 TAB 2004          1 Preterm 2002 1569w0d   F    FD     Past Medical History:  Diagnosis Date  . Diabetes mellitus without complication (HCC)    2 types of oral, 2 types of insulin- no longer taking.  was old A1C is normal  . Heart murmur    when young  . Infection    uti  . Neuropathy (HCC)   . Vaginal Pap smear, abnormal    age 30, doesn't think she has had one since   Past Surgical History:  Procedure Laterality Date  . CESAREAN SECTION     C/S x 2  . THERAPEUTIC ABORTION    . WISDOM TOOTH EXTRACTION     Family History  Problem Relation Age of Onset  . Asthma Mother   . Drug abuse Mother     died of overdose  . HIV/AIDS Mother     [id number]  . Asthma Daughter   . Diabetes Maternal Grandmother   . Cancer Maternal Grandmother     breast     Exam    Uterus:     Pelvic Exam:    Perineum: No Hemorrhoids   Vulva: normal   Vagina:  normal mucosa   pH: n/a   Cervix: no cervical motion tenderness   Adnexa: no mass, fullness, tenderness   Bony Pelvis: average  System: Breast:  normal appearance, no masses or tenderness   Skin: normal coloration and turgor, no rashes    Neurologic:  oriented, normal mood   Extremities: normal strength, tone, and muscle mass   HEENT sclera clear, anicteric, oropharynx clear, no lesions, neck supple with midline trachea and trachea midline   Mouth/Teeth mucous membranes moist, pharynx normal without lesions and dental hygiene poor   Neck supple and no masses   Cardiovascular: regular rate and rhythm   Respiratory:  appears well, vitals normal, no respiratory distress, acyanotic, normal RR, chest clear, no wheezing, crepitations, rhonchi, normal symmetric air entry   Abdomen: soft, non-tender; bowel sounds normal; no masses,  no organomegaly   Urinary: urethral meatus normal      Assessment:    Pregnancy: [id number] Patient Active Problem List   Diagnosis Date Noted  . Depression affecting pregnancy 10/27/2016  . PTSD (post-traumatic  stress disorder) 10/27/2016  . Obesity affecting pregnancy 10/27/2016  . Type 2 diabetes mellitus affecting pregnancy, antepartum 10/27/2016  . Supervision of high-risk pregnancy 10/26/2016  . Missed abortion 05/18/2013        Plan:     Initial labs drawn. Prenatal vitamins. Problem list reviewed and updated. Genetic Screening discussed First Screen: ordered.  Ultrasound discussed; fetal survey: requested.  Follow up in 1 weeks. 50% of 45 min visit spent on counseling and coordination of care.   Patient Active Problem List   Diagnosis Date Noted  . Depression affecting pregnancy 10/27/2016  . PTSD (post-traumatic stress disorder) 10/27/2016  . Obesity affecting pregnancy 10/27/2016  . Type 2 diabetes mellitus affecting pregnancy, antepartum 10/27/2016  . Supervision of high-risk pregnancy 10/26/2016  . Missed abortion 05/18/2013   Depression and PTSD--Meet with LCSW regularly.  Pt wants to talk with counselor to hep with stress and to work through issues.  [facility] has 3 months wait.  Type 2 DM--baseline labs, AS, optho, fetal echo, nutrition / counseling, check CBG 4x day,   History of 2  c/s--Rpt C/S  Dental referrla   Kathryn Mayo 10/27/2016

## 2016-10-28 ENCOUNTER — Encounter (HOSPITAL_COMMUNITY): Payer: Self-pay | Admitting: Obstetrics & Gynecology

## 2016-10-28 LAB — ANTIBODY TITER (PRENATAL TITER)

## 2016-10-28 LAB — PRENATAL PROFILE (SOLSTAS)
ANTIBODY SCREEN: POSITIVE — AB
BASOS ABS: 0 {cells}/uL (ref 0–200)
Basophils Relative: 0 %
EOS PCT: 1 %
Eosinophils Absolute: 62 cells/uL (ref 15–500)
HCT: 35.8 % (ref 35.0–45.0)
HEMOGLOBIN: 12 g/dL (ref 11.7–15.5)
HIV 1&2 Ab, 4th Generation: NONREACTIVE
Hepatitis B Surface Ag: NEGATIVE
Lymphocytes Relative: 36 %
Lymphs Abs: 2232 cells/uL (ref 850–3900)
MCH: 27.1 pg (ref 27.0–33.0)
MCHC: 33.5 g/dL (ref 32.0–36.0)
MCV: 81 fL (ref 80.0–100.0)
MONOS PCT: 10 %
MPV: 10.5 fL (ref 7.5–12.5)
Monocytes Absolute: 620 cells/uL (ref 200–950)
NEUTROS ABS: 3286 {cells}/uL (ref 1500–7800)
Neutrophils Relative %: 53 %
Platelets: 277 10*3/uL (ref 140–400)
RBC: 4.42 MIL/uL (ref 3.80–5.10)
RDW: 16.4 % — ABNORMAL HIGH (ref 11.0–15.0)
RUBELLA: 7.68 {index} — AB (ref <0.90)
Rh Type: NEGATIVE
WBC: 6.2 10*3/uL (ref 3.8–10.8)

## 2016-10-28 LAB — PRENATAL ANTIBODY IDENTIFICATION

## 2016-10-29 LAB — CYTOLOGY - PAP
Adequacy: ABSENT
Diagnosis: NEGATIVE
HPV (WINDOPATH): NOT DETECTED

## 2016-11-05 ENCOUNTER — Encounter: Payer: Self-pay | Admitting: General Practice

## 2016-11-05 LAB — HEMOGLOBINOPATHY EVALUATION
HCT: 35.8 % (ref 35.0–45.0)
HEMOGLOBIN: 12 g/dL (ref 11.7–15.5)
HGB A2 QUANT: 2.1 % (ref 1.8–3.5)
HGB A: 96.9 % (ref >96.0)
Hgb F Quant: <1 % (ref <2.0)
MCH: 27.1 pg (ref 27.0–33.0)
MCV: 81 fL (ref 80.0–100.0)
RBC: 4.42 MIL/uL (ref 3.80–5.10)
RDW: 16.4 % — AB (ref 11.0–15.0)

## 2016-11-06 ENCOUNTER — Ambulatory Visit (INDEPENDENT_AMBULATORY_CARE_PROVIDER_SITE_OTHER): Payer: Medicare Other | Admitting: Family Medicine

## 2016-11-06 ENCOUNTER — Ambulatory Visit (INDEPENDENT_AMBULATORY_CARE_PROVIDER_SITE_OTHER): Payer: Self-pay | Admitting: Clinical

## 2016-11-06 ENCOUNTER — Ambulatory Visit: Payer: Self-pay

## 2016-11-06 VITALS — BP 108/62 | HR 74 | Wt 200.5 lb

## 2016-11-06 DIAGNOSIS — O99211 Obesity complicating pregnancy, first trimester: Secondary | ICD-10-CM

## 2016-11-06 DIAGNOSIS — Z3689 Encounter for other specified antenatal screening: Secondary | ICD-10-CM | POA: Diagnosis not present

## 2016-11-06 DIAGNOSIS — F4321 Adjustment disorder with depressed mood: Secondary | ICD-10-CM

## 2016-11-06 DIAGNOSIS — E669 Obesity, unspecified: Secondary | ICD-10-CM | POA: Diagnosis not present

## 2016-11-06 DIAGNOSIS — O3680X0 Pregnancy with inconclusive fetal viability, not applicable or unspecified: Secondary | ICD-10-CM | POA: Diagnosis not present

## 2016-11-06 DIAGNOSIS — O24111 Pre-existing diabetes mellitus, type 2, in pregnancy, first trimester: Secondary | ICD-10-CM | POA: Diagnosis present

## 2016-11-06 DIAGNOSIS — O0991 Supervision of high risk pregnancy, unspecified, first trimester: Secondary | ICD-10-CM

## 2016-11-06 DIAGNOSIS — O24119 Pre-existing diabetes mellitus, type 2, in pregnancy, unspecified trimester: Secondary | ICD-10-CM

## 2016-11-06 LAB — POCT URINALYSIS DIP (DEVICE)
Bilirubin Urine: NEGATIVE
GLUCOSE, UA: NEGATIVE mg/dL
Ketones, ur: NEGATIVE mg/dL
LEUKOCYTES UA: NEGATIVE
NITRITE: NEGATIVE
PROTEIN: NEGATIVE mg/dL
SPECIFIC GRAVITY, URINE: 1.02 (ref 1.005–1.030)
UROBILINOGEN UA: 0.2 mg/dL (ref 0.0–1.0)
pH: 7 (ref 5.0–8.0)

## 2016-11-06 MED ORDER — ACCU-CHEK FASTCLIX LANCETS MISC: 1.0000 [IU] | Freq: Four times a day (QID) | 12 refills | Status: DC

## 2016-11-06 MED ORDER — GLUCOSE BLOOD VI STRP: ORAL_STRIP | 12 refills | Status: DC

## 2016-11-06 MED ORDER — ACCU-CHEK NANO SMARTVIEW W/DEVICE KIT: 1.0000 | PACK | 0 refills | Status: DC

## 2016-11-06 NOTE — Progress Notes (Signed)
Pt informed that the ultrasound is considered a limited OB ultrasound and is not intended to be a complete ultrasound exam.  Patient also informed that the ultrasound is not being completed with the intent of assessing for fetal or placental anomalies or any pelvic abnormalities.  Explained that the purpose of today's ultrasound is to assess for viability.  Patient acknowledges the purpose of the exam and the limitations of the study.    Single IUP;  FHR - 164 bpm per PW doppler

## 2016-11-06 NOTE — BH Specialist Note (Signed)
Session Start time: 10:00  End Time: 10:30 Total Time:  30 minutes Type of Service: Behavioral Health - Individual/Family Interpreter: No.   Interpreter Name & Language: n/a # [facility]  SUBJECTIVE: Kathryn Mayo is a 30 y.o. female  Pt. was referred by Dr Adrian BlackwaterStinson for:  depression. Pt. reports the following symptoms/concerns: Pt states that she is concerned about developing postpartum depression, as she experienced after last pregnancy;she has learned to cope with feelings of depression by walking daily, keeping a positive outlook, and feels good about losing weight early in pregnancy.  Duration of problem:  Increase in over one month Severity: moderate [facility]: [facility]; did not return  OBJECTIVE: Mood: Appropriate & Affect: Appropriate Risk of harm to self or others: No known risk of harm to self or others Assessments administered: PHQ9: 12/ GAD7: 7  LIFE CONTEXT:  Family & Social: Lives with two children(12yo son, 10yo daughter)  Product/process development scientistchool/ Work: Disabilty Self-Care: Walks daily, losing weight, some sleep difficulty  Life changes: Current pregnancy What is important to pt/family (values): Overall wellbeing, keeping a positive outlook on life  GOALS ADDRESSED:  -Reduce symptoms of depression  INTERVENTIONS: Motivational Interviewing and Strength-based   ASSESSMENT:  Pt currently experiencing Adjustment disorder with depressed mood.  Pt may benefit from psychoeducation and brief therapeutic intervention regarding coping with symptoms of depression.   PLAN: 1. F/U with behavioral health clinician: One month if symptoms increase, or early third trimester for postpartum planning 2. Behavioral Health meds: none 3. Behavioral recommendations:  -Continue taking daily walks -Continue keeping positive outlook on life -Consider Worry Hour strategy to continue to prioritize life stressors -[facility], no later than third trimester -Read educational material regarding coping with symptoms of depression(and depression) 4. Referral: Brief Counseling/Psychotherapy and Psychoeducation 5. From scale of 1-10, how likely are you to follow plan: 9  Woc-Behavioral Health Clinician  Behavioral Health Clinician  Marlon PelWarmhandoff:   Warm Hand Off Completed.        Dep[facility] screen [facility] 2/[facility] 11/06/2016 10/26/2016  Decreased Interest 3 3  Down, Depressed, Hopeless 1 0  PHQ - 2 Score 4 3  Altered sleeping 3 3  Tired, decreased energy 3 3  Change in appetite 0 3  Feeling bad or failure about yourself  0 0  Trouble concentrating 2 2  Moving slowly or fidgety/restless 0 3  Suicidal thoughts 0 0  PHQ-9 Score 12 17   GAD 7 : Generalized Anxiety Score 11/06/2016 10/26/2016  Nervous, Anxious, on Edge 2 3  Control/stop worrying 1 1  Worry too much - different things 1 1  Trouble relaxing 0 2  Restless 0 0  Easily annoyed or irritable 3 3  Afraid - awful might happen 0 0  Total GAD 7 Score 7 10

## 2016-11-06 NOTE — Addendum Note (Signed)
Addended by: Jill SideAY, DIANE L on: 11/06/2016 10:37 AM   Modules accepted: Orders

## 2016-11-06 NOTE — Progress Notes (Signed)
Subjective:  Kathryn Mayo is a 30 y.o. [phone] at 1973w0d being seen today for ongoing prenatal care.  She is currently monitored for the following issues for this high-risk pregnancy and has Missed abortion; Supervision of high-risk pregnancy; Depression affecting pregnancy; PTSD (post-traumatic stress disorder); Obesity affecting pregnancy; Type 2 diabetes mellitus affecting pregnancy, antepartum; and Poor dentition on her problem list.  GDM: Patient diet controlled.  HgA1c was 5.5. Reports no hypoglycemic episodes.  Tolerating medication well. Has not been checking blood sugars.  Patient reports no complaints.  Contractions: Not present. Vag. Bleeding: None.  Movement: Absent. Denies leaking of fluid.   The following portions of the patient's history were reviewed and updated as appropriate: allergies, current medications, past family history, past medical history, past social history, past surgical history and problem list. Problem list updated.  Objective:   Vitals:   11/06/16 0911  BP: 108/62  Pulse: 74  Weight: 200 lb 8 oz (90.9 kg)    Fetal Status:     Movement: Absent     General:  Alert, oriented and cooperative. Patient is in no acute distress.  Skin: Skin is warm and dry. No rash noted.   Cardiovascular: Normal heart rate noted  Respiratory: Normal respiratory effort, no problems with respiration noted  Abdomen: Soft, gravid, appropriate for gestational age. Pain/Pressure: Absent     Pelvic: Vag. Bleeding: None     Cervical exam deferred        Extremities: Normal range of motion.  Edema: None  Mental Status: Normal mood and affect. Normal behavior. Normal judgment and thought content.   Urinalysis:      Assessment and Plan:  Pregnancy: [id number] at 3373w0d  1. Supervision of high risk pregnancy in first trimester FHT by US  2. Type 2 diabetes mellitus affecting pregnancy, antepartum Discussed checking CBG fasting and 2hr PP.  3. Obesity affecting pregnancy in first  trimester  Preterm labor symptoms and general obstetric precautions including but not limited to vaginal bleeding, contractions, leaking of fluid and fetal movement were reviewed in detail with the patient. Please refer to After Visit Summary for other counseling recommendations.  No Follow-up on file.   Levie HeritageJacob J Rosha Cocker, DO

## 2016-11-12 ENCOUNTER — Other Ambulatory Visit: Payer: Self-pay | Admitting: Obstetrics & Gynecology

## 2016-11-12 ENCOUNTER — Ambulatory Visit (HOSPITAL_COMMUNITY)
Admission: RE | Admit: 2016-11-12 | Discharge: 2016-11-12 | Disposition: A | Discharge status: Home / Self Care | Payer: Medicare Other | Source: Ambulatory Visit | Attending: Obstetrics & Gynecology | Admitting: Obstetrics & Gynecology

## 2016-11-12 ENCOUNTER — Encounter (HOSPITAL_COMMUNITY): Payer: Self-pay

## 2016-11-12 DIAGNOSIS — O34211 Maternal care for low transverse scar from previous cesarean delivery: Secondary | ICD-10-CM | POA: Diagnosis present

## 2016-11-12 DIAGNOSIS — O24911 Unspecified diabetes mellitus in pregnancy, first trimester: Secondary | ICD-10-CM | POA: Diagnosis not present

## 2016-11-12 DIAGNOSIS — O09291 Supervision of pregnancy with other poor reproductive or obstetric history, first trimester: Secondary | ICD-10-CM | POA: Insufficient documentation

## 2016-11-12 DIAGNOSIS — Z3682 Encounter for antenatal screening for nuchal translucency: Secondary | ICD-10-CM

## 2016-11-12 DIAGNOSIS — Z3A12 12 weeks gestation of pregnancy: Secondary | ICD-10-CM

## 2016-11-12 DIAGNOSIS — O99331 Smoking (tobacco) complicating pregnancy, first trimester: Secondary | ICD-10-CM

## 2016-11-12 DIAGNOSIS — O0990 Supervision of high risk pregnancy, unspecified, unspecified trimester: Secondary | ICD-10-CM

## 2016-11-12 DIAGNOSIS — O34219 Maternal care for unspecified type scar from previous cesarean delivery: Secondary | ICD-10-CM

## 2016-11-12 DIAGNOSIS — O24311 Unspecified pre-existing diabetes mellitus in pregnancy, first trimester: Secondary | ICD-10-CM

## 2016-11-12 DIAGNOSIS — O09299 Supervision of pregnancy with other poor reproductive or obstetric history, unspecified trimester: Secondary | ICD-10-CM

## 2016-11-17 ENCOUNTER — Other Ambulatory Visit: Payer: Self-pay | Admitting: Obstetrics & Gynecology

## 2016-11-23 ENCOUNTER — Ambulatory Visit (INDEPENDENT_AMBULATORY_CARE_PROVIDER_SITE_OTHER): Payer: Medicare Other | Admitting: Obstetrics & Gynecology

## 2016-11-23 VITALS — BP 120/76 | HR 86 | Wt 201.0 lb

## 2016-11-23 DIAGNOSIS — Z23 Encounter for immunization: Secondary | ICD-10-CM

## 2016-11-23 DIAGNOSIS — O24119 Pre-existing diabetes mellitus, type 2, in pregnancy, unspecified trimester: Secondary | ICD-10-CM | POA: Diagnosis present

## 2016-11-23 DIAGNOSIS — F17200 Nicotine dependence, unspecified, uncomplicated: Secondary | ICD-10-CM | POA: Diagnosis not present

## 2016-11-23 DIAGNOSIS — Z3689 Encounter for other specified antenatal screening: Secondary | ICD-10-CM

## 2016-11-23 DIAGNOSIS — O0990 Supervision of high risk pregnancy, unspecified, unspecified trimester: Secondary | ICD-10-CM

## 2016-11-23 LAB — GLUCOSE, CAPILLARY: Glucose-Capillary: 142 mg/dL — ABNORMAL HIGH (ref 65–99)

## 2016-11-23 LAB — POCT URINALYSIS DIP (DEVICE)
Bilirubin Urine: NEGATIVE
GLUCOSE, UA: NEGATIVE mg/dL
Hgb urine dipstick: NEGATIVE
KETONES UR: NEGATIVE mg/dL
Leukocytes, UA: NEGATIVE
Nitrite: NEGATIVE
PROTEIN: NEGATIVE mg/dL
SPECIFIC GRAVITY, URINE: 1.02 (ref 1.005–1.030)
Urobilinogen, UA: 0.2 mg/dL (ref 0.0–1.0)
pH: 7 (ref 5.0–8.0)

## 2016-11-23 MED ORDER — METRONIDAZOLE 500 MG PO TABS: 500.0000 mg | ORAL_TABLET | Freq: Two times a day (BID) | ORAL | 0 refills | Status: DC

## 2016-11-23 NOTE — Addendum Note (Signed)
Addended by: Faythe CasaBELLAMY, Jessalynn Mccowan M on: 11/23/2016 01:37 PM   Modules accepted: Orders

## 2016-11-23 NOTE — Progress Notes (Signed)
Pt requests tx for BV Anatomy US scheduled for February 9th @ 0900.  Pt notified.  Lajean SaverJ Bellamy    PRENATAL VISIT NOTE  Subjective:  Kathryn Mayo is a 31 y.o. [phone] at 3221w3d being seen today for ongoing prenatal care.  She is currently monitored for the following issues for this high-risk pregnancy and has History of miscarriage; Supervision of high-risk pregnancy; Depression affecting pregnancy; PTSD (post-traumatic stress disorder); Obesity affecting pregnancy; Type 2 diabetes mellitus affecting pregnancy, antepartum; Poor dentition; and Smoker on her problem list.  Patient reports vaginal discharge continues.  .  Contractions: Not present. Vag. Bleeding: None.  Movement: Present. Denies leaking of fluid.   The following portions of the patient's history were reviewed and updated as appropriate: allergies, current medications, past family history, past medical history, past social history, past surgical history and problem list. Problem list updated.  Objective:   Vitals:   11/23/16 0914 11/23/16 0917  BP: (!) 133/93 120/76  Pulse: 79 86  Weight: 201 lb (91.2 kg)     Fetal Status: Fetal Heart Rate (bpm): 162   Movement: Present     General:  Alert, oriented and cooperative. Patient is in no acute distress.  Skin: Skin is warm and dry. No rash noted.   Cardiovascular: Normal heart rate noted  Respiratory: Normal respiratory effort, no problems with respiration noted  Abdomen: Soft, gravid, appropriate for gestational age. Pain/Pressure: Absent     Pelvic:  Cervical exam deferred        Extremities: Normal range of motion.  Edema: Trace  Mental Status: Normal mood and affect. Normal behavior. Normal judgment and thought content.   Assessment and Plan:  Pregnancy: [id number] at 6821w3d  1. Supervision of high risk pregnancy, antepartum - US MFM OB COMP + 14 WK; Future - AFP in 2 weeks - Flu shot today  2. Type 2 diabetes mellitus affecting pregnancy, antepartum - TSH, CMP  normal - Protein / creatinine ratio, urine - eye appt in Feb -Pt had juice for breakfast in ca--CBG 142.  Pt not checking CBGs.  She understands the risk of fetal morbidity and mortality.  She is going to start checking today! -Start Baby ASA  4. Smoker - pt decreased to 7 cigarettes a day; will check in with her weekly  Preterm labor symptoms and general obstetric precautions including but not limited to vaginal bleeding, contractions, leaking of fluid and fetal movement were reviewed in detail with the patient. Please refer to After Visit Summary for other counseling recommendations.  Return in about 2 weeks (around 12/07/2016).   Lesly DukesKelly H Bryley Chrisman, MD

## 2016-11-24 LAB — PROTEIN / CREATININE RATIO, URINE
CREATININE, URINE: 125 mg/dL (ref 20–320)
PROTEIN CREATININE RATIO: 88 mg/g{creat} (ref 21–161)
TOTAL PROTEIN, URINE: 11 mg/dL (ref 5–24)

## 2016-12-07 ENCOUNTER — Ambulatory Visit (INDEPENDENT_AMBULATORY_CARE_PROVIDER_SITE_OTHER): Payer: Medicare Other | Admitting: Obstetrics & Gynecology

## 2016-12-07 ENCOUNTER — Encounter: Payer: Medicare Other | Admitting: Clinical

## 2016-12-07 VITALS — BP 126/74 | HR 86 | Wt 205.1 lb

## 2016-12-07 DIAGNOSIS — O24113 Pre-existing diabetes mellitus, type 2, in pregnancy, third trimester: Secondary | ICD-10-CM

## 2016-12-07 DIAGNOSIS — O24119 Pre-existing diabetes mellitus, type 2, in pregnancy, unspecified trimester: Secondary | ICD-10-CM

## 2016-12-07 DIAGNOSIS — O0992 Supervision of high risk pregnancy, unspecified, second trimester: Secondary | ICD-10-CM

## 2016-12-07 DIAGNOSIS — F17200 Nicotine dependence, unspecified, uncomplicated: Secondary | ICD-10-CM

## 2016-12-07 DIAGNOSIS — K089 Disorder of teeth and supporting structures, unspecified: Secondary | ICD-10-CM

## 2016-12-07 LAB — GLUCOSE, CAPILLARY: Glucose-Capillary: 204 mg/dL — ABNORMAL HIGH (ref 65–99)

## 2016-12-07 MED ORDER — FLUCONAZOLE 150 MG PO TABS: 150.0000 mg | ORAL_TABLET | Freq: Once | ORAL | 3 refills | Status: AC

## 2016-12-07 NOTE — Progress Notes (Signed)
Not checking blood sugars Not taking Asprin Would like another medication for BV. Does not want to be check today. De RN    PRENATAL VISIT NOTE  Subjective:  Kathryn Mayo is a 31 y.o. [phone] at 337w3d being seen today for ongoing prenatal care.  She is currently monitored for the following issues for this high-risk pregnancy and has History of miscarriage; Supervision of high-risk pregnancy; Depression affecting pregnancy; PTSD (post-traumatic stress disorder); Obesity affecting pregnancy; Type 2 diabetes mellitus affecting pregnancy, antepartum; Poor dentition; and Smoker on her problem list.  Patient reports vaginal irritation.   .  .  Movement: Absent. Denies leaking of fluid.   The following portions of the patient's history were reviewed and updated as appropriate: allergies, current medications, past family history, past medical history, past social history, past surgical history and problem list. Problem list updated.  Objective:   Vitals:   12/07/16 0837 12/07/16 0849  BP: (!) 126/55 126/74  Pulse: 84 86  Weight: 206 lb 1.6 oz (93.5 kg) 205 lb 1.6 oz (93 kg)    Fetal Status: Fetal Heart Rate (bpm): 164   Movement: Absent     General:  Alert, oriented and cooperative. Patient is in no acute distress.  Skin: Skin is warm and dry. No rash noted.   Cardiovascular: Normal heart rate noted  Respiratory: Normal respiratory effort, no problems with respiration noted  Abdomen: Soft, gravid, appropriate for gestational age. Pain/Pressure: Absent     Pelvic:  Cervical exam deferred (pt refused exam)        Extremities: Normal range of motion.     Mental Status: Normal mood and affect. Normal behavior. Normal judgment and thought content.   Assessment and Plan:  Pregnancy: [id number] at 337w3d  1. Supervision of high risk pregnancy in second trimester -Vaginal d/c could be due to hyperglycemia-->yeast; will treat with diflucan; if still having symptoms will require vaginal swab  next visit. - Alpha fetoprotein, maternal  2. Type 2 diabetes mellitus affecting pregnancy, antepartum Ate banana today--sugar 204.  Pt PROMISES she will start checking blood sugars.  Going to pick up meter today.  She understands risk of fetal death due to hyperglycemia.   - AMB referral to maternal fetal medicine - Alpha fetoprotein, maternal - Anatomy US - Pt encouraged to start baby aspirin.  3. Poor dentition -Reschedule appt (was canceled due to snow)  4. Smoker -encourage to quit.  Preterm labor symptoms and general obstetric precautions including but not limited to vaginal bleeding, contractions, leaking of fluid and fetal movement were reviewed in detail with the patient. Please refer to After Visit Summary for other counseling recommendations.  Return in about 1 week (around 12/14/2016).   Lesly DukesKelly H Falecia Vannatter, MD

## 2016-12-08 LAB — ALPHA FETOPROTEIN, MATERNAL
AFP: 55.3 ng/mL
CURR GEST AGE: 16.4 wk
MOM FOR AFP: 1.67
Open Spina bifida: NEGATIVE

## 2016-12-18 ENCOUNTER — Ambulatory Visit (HOSPITAL_COMMUNITY): Payer: Medicare Other

## 2016-12-18 ENCOUNTER — Other Ambulatory Visit: Payer: Self-pay | Admitting: Obstetrics & Gynecology

## 2016-12-18 ENCOUNTER — Encounter (HOSPITAL_COMMUNITY): Payer: Self-pay

## 2016-12-18 ENCOUNTER — Ambulatory Visit (HOSPITAL_COMMUNITY)
Admission: RE | Admit: 2016-12-18 | Discharge: 2016-12-18 | Disposition: A | Discharge status: Home / Self Care | Payer: Medicare Other | Source: Ambulatory Visit | Attending: Obstetrics & Gynecology | Admitting: Obstetrics & Gynecology

## 2016-12-18 DIAGNOSIS — O24312 Unspecified pre-existing diabetes mellitus in pregnancy, second trimester: Secondary | ICD-10-CM | POA: Diagnosis not present

## 2016-12-18 DIAGNOSIS — Z3689 Encounter for other specified antenatal screening: Secondary | ICD-10-CM

## 2016-12-18 DIAGNOSIS — O09292 Supervision of pregnancy with other poor reproductive or obstetric history, second trimester: Secondary | ICD-10-CM | POA: Insufficient documentation

## 2016-12-18 DIAGNOSIS — O0992 Supervision of high risk pregnancy, unspecified, second trimester: Secondary | ICD-10-CM | POA: Insufficient documentation

## 2016-12-18 DIAGNOSIS — O99332 Smoking (tobacco) complicating pregnancy, second trimester: Secondary | ICD-10-CM | POA: Diagnosis not present

## 2016-12-18 DIAGNOSIS — O0990 Supervision of high risk pregnancy, unspecified, unspecified trimester: Secondary | ICD-10-CM

## 2016-12-18 DIAGNOSIS — Z3A18 18 weeks gestation of pregnancy: Secondary | ICD-10-CM | POA: Insufficient documentation

## 2016-12-18 DIAGNOSIS — O34219 Maternal care for unspecified type scar from previous cesarean delivery: Secondary | ICD-10-CM | POA: Insufficient documentation

## 2016-12-21 ENCOUNTER — Telehealth: Payer: Self-pay | Admitting: *Deleted

## 2016-12-21 ENCOUNTER — Ambulatory Visit (INDEPENDENT_AMBULATORY_CARE_PROVIDER_SITE_OTHER): Payer: Medicare Other | Admitting: Obstetrics & Gynecology

## 2016-12-21 VITALS — BP 120/70 | HR 87 | Wt 215.0 lb

## 2016-12-21 DIAGNOSIS — O24112 Pre-existing diabetes mellitus, type 2, in pregnancy, second trimester: Secondary | ICD-10-CM

## 2016-12-21 DIAGNOSIS — Z6791 Unspecified blood type, Rh negative: Secondary | ICD-10-CM | POA: Insufficient documentation

## 2016-12-21 DIAGNOSIS — O24119 Pre-existing diabetes mellitus, type 2, in pregnancy, unspecified trimester: Secondary | ICD-10-CM

## 2016-12-21 DIAGNOSIS — O0992 Supervision of high risk pregnancy, unspecified, second trimester: Secondary | ICD-10-CM

## 2016-12-21 DIAGNOSIS — O26899 Other specified pregnancy related conditions, unspecified trimester: Secondary | ICD-10-CM

## 2016-12-21 MED ORDER — B COMPLEX PO TABS: 1.0000 | ORAL_TABLET | Freq: Every day | ORAL | 3 refills | Status: DC

## 2016-12-21 MED ORDER — FOLIC ACID 1 MG PO TABS: 1.0000 mg | ORAL_TABLET | Freq: Every day | ORAL | 10 refills | Status: DC

## 2016-12-21 MED ORDER — PREPLUS 27-1 MG PO TABS: 1.0000 | ORAL_TABLET | Freq: Every day | ORAL | 13 refills | Status: DC

## 2016-12-21 MED ORDER — GLYBURIDE 2.5 MG PO TABS: 1.2500 mg | ORAL_TABLET | Freq: Every day | ORAL | 3 refills | Status: DC

## 2016-12-21 NOTE — Telephone Encounter (Addendum)
Pt left message stating that 2 prescriptions were sent to her pharmacy today - prenatal vitamins and glyburide. The pharmacy has told her that her insurance will not cover the medication and she requests a call back from the nurse.   2/14  0920  Called and spoke with pharmacist Kathlene NovemberMike who stated that pt's prescriptions (Pre-plus and glyburide) require prior authorization. He provided tel # [phone].   2/14  1600 Called tel # given by pharmacy [facility]) and was told that pt has Medicare and they do not handle that. I then gave pt's Medicaid ID (per scanned document under media) and he told me that they do not have that number on file and cannot help me. I tried calling pharmacy again to discuss and obtain different reference number and was put on hold >10 minutes. Will follow up tomorrow. Message left for pt that we are working on her prescription problem and hope to have it remedied by tomorrow.   2/16  1300  Called pt and left message stating that I have just finished speaking with the Medicare pharmacist and was successful in getting her prescriptions approved with her Medicare part D insurance.  She may pick up her prescriptions later today at her pharmacy. She should begin taking the glyburide tonight as directed.

## 2016-12-21 NOTE — Progress Notes (Signed)
Pt needs rx for PNV, she is vegetarian so would like something with a lot of folic acid.

## 2016-12-21 NOTE — Progress Notes (Signed)
   PRENATAL VISIT NOTE  Subjective:  Kathryn Mayo is a 31 y.o. [id number] at 3632w3d being seen today for ongoing prenatal care.  She is currently monitored for the following issues for this high-risk pregnancy and has History of miscarriage; Supervision of high-risk pregnancy; Depression affecting pregnancy; PTSD (post-traumatic stress disorder); Obesity affecting pregnancy; Type 2 diabetes mellitus affecting pregnancy, antepartum; Poor dentition; Smoker; and Rh negative, antepartum on her problem list.  Patient reports no complaints.  Contractions: Not present. Vag. Bleeding: None.  Movement: Present. Denies leaking of fluid.   The following portions of the patient's history were reviewed and updated as appropriate: allergies, current medications, past family history, past medical history, past social history, past surgical history and problem list. Problem list updated.  Objective:   Vitals:   12/21/16 0813  BP: 120/70  Pulse: 87  Weight: 215 lb (97.5 kg)    Fetal Status: Fetal Heart Rate (bpm): 154   Movement: Present     General:  Alert, oriented and cooperative. Patient is in no acute distress.  Skin: Skin is warm and dry. No rash noted.   Cardiovascular: Normal heart rate noted  Respiratory: Normal respiratory effort, no problems with respiration noted  Abdomen: Soft, gravid, appropriate for gestational age. Pain/Pressure: Present     Pelvic:  Cervical exam deferred        Extremities: Normal range of motion.  Edema: None  Mental Status: Normal mood and affect. Normal behavior. Normal judgment and thought content.   CBGs: All fasting >100; 100-161; only a few PP done and were mostly within range.  Assessment and Plan:  Pregnancy: [id number] at 1332w3d  1. Type 2 diabetes mellitus affecting pregnancy, antepartum Emphasized importance of checking and logging BS.  Will start Glyburide for the elevated fasting. - glyBURIDE (DIABETA) 2.5 MG tablet; Take 0.5 tablets (1.25 mg total) by  mouth at bedtime.  Dispense: 30 tablet; Refill: 3  2. Supervision of high risk pregnancy in second trimester Vitamins prescribed as per patient's preference.  - Prenatal Vit-Fe Fumarate-FA (PREPLUS) 27-1 MG TABS; Take 1 tablet by mouth daily.  Dispense: 30 tablet; Refill: 13 - folic acid (FOLVITE) 1 MG tablet; Take 1 tablet (1 mg total) by mouth daily.  Dispense: 30 tablet; Refill: 10 - b complex vitamins tablet; Take 1 tablet by mouth daily.  Dispense: 30 tablet; Refill: 3  No other complaints or concerns.  Routine obstetric precautions reviewed. Please refer to After Visit Summary for other counseling recommendations.  Return in about 2 weeks (around 01/04/2017) for OB Visit.   Tereso NewcomerUgonna A Kayne Yuhas, MD

## 2016-12-21 NOTE — Patient Instructions (Signed)
Return to clinic for any scheduled appointments or obstetric concerns, or go to [facility] for evaluation  

## 2016-12-22 ENCOUNTER — Other Ambulatory Visit (HOSPITAL_COMMUNITY): Payer: Self-pay | Admitting: *Deleted

## 2016-12-22 DIAGNOSIS — Z7984 Long term (current) use of oral hypoglycemic drugs: Principal | ICD-10-CM

## 2016-12-22 DIAGNOSIS — O24119 Pre-existing diabetes mellitus, type 2, in pregnancy, unspecified trimester: Secondary | ICD-10-CM

## 2016-12-28 ENCOUNTER — Encounter: Payer: Self-pay | Admitting: Obstetrics & Gynecology

## 2016-12-31 ENCOUNTER — Ambulatory Visit (HOSPITAL_COMMUNITY): Payer: Medicare Other

## 2017-01-01 ENCOUNTER — Ambulatory Visit (HOSPITAL_COMMUNITY): Payer: Medicare Other

## 2017-01-01 ENCOUNTER — Encounter (HOSPITAL_COMMUNITY): Payer: Self-pay

## 2017-01-01 ENCOUNTER — Ambulatory Visit (HOSPITAL_COMMUNITY)
Admission: RE | Admit: 2017-01-01 | Discharge: 2017-01-01 | Disposition: A | Discharge status: Home / Self Care | Payer: Medicare Other | Source: Ambulatory Visit | Attending: Obstetrics & Gynecology | Admitting: Obstetrics & Gynecology

## 2017-01-01 DIAGNOSIS — Z794 Long term (current) use of insulin: Secondary | ICD-10-CM | POA: Diagnosis not present

## 2017-01-01 DIAGNOSIS — Z3A20 20 weeks gestation of pregnancy: Secondary | ICD-10-CM | POA: Insufficient documentation

## 2017-01-01 DIAGNOSIS — E1140 Type 2 diabetes mellitus with diabetic neuropathy, unspecified: Secondary | ICD-10-CM | POA: Diagnosis not present

## 2017-01-01 DIAGNOSIS — O24112 Pre-existing diabetes mellitus, type 2, in pregnancy, second trimester: Secondary | ICD-10-CM | POA: Diagnosis not present

## 2017-01-01 DIAGNOSIS — E1165 Type 2 diabetes mellitus with hyperglycemia: Secondary | ICD-10-CM | POA: Insufficient documentation

## 2017-01-01 DIAGNOSIS — O99332 Smoking (tobacco) complicating pregnancy, second trimester: Secondary | ICD-10-CM | POA: Insufficient documentation

## 2017-01-01 DIAGNOSIS — Z7982 Long term (current) use of aspirin: Secondary | ICD-10-CM | POA: Insufficient documentation

## 2017-01-01 NOTE — Consult Note (Signed)
Maternal Fetal Medicine Consultation  Requesting Provider(s): Silas Sacramento, MD  Reason for consultation: Poorly controlled type 2 diabetes in pregnancy  HPI: Kathryn Mayo is a 31 yo [id number], EDD 05/21/2017 who is currently at 71w 0d seen for consultation due to poorly controlled type 2 diabetes in pregnancy.  Kathryn Mayo reports that she was first diagnosed with diabetes in 2003 following the delivery of term stillborn infant that weighed > 9 lbs.  Periodically, she has been treated with insulin / metformin.  Several years ago, she saw her primary care physician and was given the diagnosis of diabetic neuropathy.  She made some major life changes at that point - became a vegetarian and lost approximately 80 lbs.  Her initial HbA1C was 5.5 but since becoming pregnant she has found it increasingly difficult to achieve good control of her fingerstick values.  Kathryn Mayo was recently started on 2.5 mg of glyburide each night, but reports that her blood sugars are still very high.  She has a glucometer - has not been consistently taking her fingerstick values, but all her fasting values are int eh 130-150 range and she reports that her post prandial values "are even higher".    Kathryn Mayo past OB history is remarkable for a term IUFD in 2003 felt likely due to undiagnosed diabetic complications, two term C-sections (suspected fetal macrosomia) and a prior early SAB and town early 81.  She is without complaints today.  OB History: OB History    Gravida Para Term Preterm AB Living   8 3 3  0 4 2   SAB TAB Ectopic Multiple Live Births   2 2     2       PMH:  Past Medical History:  Diagnosis Date  . Diabetes mellitus without complication ([place])    2 types of oral, 2 types of insulin- no longer taking.  was old A1C is normal  . Heart murmur    when young  . Infection    uti  . Neuropathy ([place])   . Vaginal Pap smear, abnormal    age 50, doesn't think she has had one since    PSH:  Past  Surgical History:  Procedure Laterality Date  . CESAREAN SECTION     C/S x 2  . THERAPEUTIC ABORTION    . WISDOM TOOTH EXTRACTION     Meds:  Current Outpatient Prescriptions on File Prior to Encounter  Medication Sig Dispense Refill  . ACCU-CHEK FASTCLIX LANCETS MISC 1 Units by Percutaneous route 4 (four) times daily. 100 each 12  . aspirin EC 81 MG tablet Take 1 tablet daily starting at 12 weeks of pregnancy 30 tablet 10  . b complex vitamins tablet Take 1 tablet by mouth daily. 30 tablet 3  . Blood Glucose Monitoring Suppl (ACCU-CHEK NANO SMARTVIEW) w/Device KIT 1 kit by Subdermal route as directed. Check blood sugars for fasting, and two hours after breakfast, lunch and dinner (4 checks daily) 1 kit 0  . folic acid (FOLVITE) 1 MG tablet Take 1 tablet (1 mg total) by mouth daily. 30 tablet 10  . glucose blood (ACCU-CHEK SMARTVIEW) test strip Use as instructed to check blood sugars 100 each 12  . glyBURIDE (DIABETA) 2.5 MG tablet Take 0.5 tablets (1.25 mg total) by mouth at bedtime. 30 tablet 3  . Prenatal Vit-Fe Fumarate-FA (PREPLUS) 27-1 MG TABS Take 1 tablet by mouth daily. 30 tablet 13  . Prenatal Vit-Fe Fumarate-FA (PRENATAL MULTIVITAMIN) TABS tablet Take 1 tablet by  mouth daily at 12 noon.     No current facility-administered medications on file prior to encounter.    Allergies:  Allergies  Allergen Reactions  . Shellfish Allergy Itching   FH:  Family History  Problem Relation Age of Onset  . Asthma Mother   . Drug abuse Mother     died of overdose  . HIV/AIDS Mother     72  . Asthma Daughter   . Diabetes Maternal Grandmother   . Cancer Maternal Grandmother     breast   Soc:  Social History   Social History  . Marital status: Single    Spouse name: N/A  . Number of children: N/A  . Years of education: N/A   Occupational History  . Not on file.   Social History Main Topics  . Smoking status: Current Every Day Smoker    Packs/day: 0.25    Years: 10.00     Types: Cigarettes, E-cigarettes  . Smokeless tobacco: Never Used     Comment: trying to stop  . Alcohol use No     Comment: 05/05/2013  . Drug use: Yes    Types: Marijuana     Comment: last used 2 days ago 10/24/16  . Sexual activity: Yes    Birth control/ protection: None   Other Topics Concern  . Not on file   Social History Narrative  . No narrative on file    Review of Systems: no vaginal bleeding or cramping/contractions, no LOF, no nausea/vomiting. All other systems reviewed and are negative.  PE:   Vitals:   01/01/17 0927  BP: 118/86  Pulse: 88      A/P: 1) IUP at 20w 0d  2) Poorly controlled Type 2 diabetes - the patient was recently started on Glyburide 2.5 mg qHS which she reports "is not working".  While the patient has not been consistently checking her fingerstick values, her fasting values are all elevated in the 130-150 range and she reports that her post-prandial values are elevated even higher.  While she does have some room to increase her glyburide dose, feel that the likelihood that she will need insulin is very high and my preference would be to transition her to insulin now.  The patient is in agreement and seems to be motivated to get better blood sugar control.  Will begin split dose insulin - 0.7 units/Kg: plan initial dose of 40 units Lantus and 8 units of Novolog with each meal (~ 64 units/day).  Prescriptions given.  Will bring all prescriptions and supplies and meet with the diabetic educator next week to begin therapy.  In the interim, will increase Glyburide to 5 mg BID.  Will hold dose the morning that she starts her insulin therapy. Will follow up with MFM in 2 weeks to review fingerstick values and make adjustments as appropriate.  Additionally, recommend: 1) Serial ultrasounds every 4 weeks for growth 2) Fetal echo - missed first appointment; was rescheduled 3) If not yet done, would recommend baseline labs - 24 hr urine protein/ creatinine  clearance and CMP 4) Would check HbA1C each trimester 5) Continue to stress the importance of frequent glucose fingersticks - needs to check fasting and 2hr post prandial values 6) Antenatal testing beginning at 32 weeks 7) Delivery by [redacted] weeks gestation or earlier based on the clinical scenario   Thank you for the opportunity to be a part of the care of Tamyia M Kirven. Please contact our office if we can be of  further assistance.   I spent approximately 30 minutes with this patient with over 50% of time spent in face-to-face counseling.  Benjaman Lobe, MD Maternal Fetal Medicine

## 2017-01-04 ENCOUNTER — Encounter: Payer: Self-pay | Admitting: Obstetrics & Gynecology

## 2017-01-04 ENCOUNTER — Telehealth: Payer: Self-pay | Admitting: General Practice

## 2017-01-06 ENCOUNTER — Telehealth: Payer: Self-pay | Admitting: *Deleted

## 2017-01-06 NOTE — Telephone Encounter (Signed)
Attempted to contact patient regarding her missed appointments. Unable to reach or leave message. Per chart review pt spoke with registrar and has rescheduled for next week. Will attempt to reach her again this afternoon.

## 2017-01-07 ENCOUNTER — Ambulatory Visit (HOSPITAL_COMMUNITY)
Admission: RE | Admit: 2017-01-07 | Discharge: 2017-01-07 | Disposition: A | Discharge status: Home / Self Care | Payer: Medicare Other | Source: Ambulatory Visit | Attending: Obstetrics & Gynecology | Admitting: Obstetrics & Gynecology

## 2017-01-07 ENCOUNTER — Encounter (HOSPITAL_COMMUNITY): Payer: Self-pay

## 2017-01-12 ENCOUNTER — Ambulatory Visit (INDEPENDENT_AMBULATORY_CARE_PROVIDER_SITE_OTHER): Payer: Medicare Other | Admitting: Obstetrics and Gynecology

## 2017-01-12 VITALS — BP 123/78 | HR 93 | Wt 224.7 lb

## 2017-01-12 DIAGNOSIS — O0992 Supervision of high risk pregnancy, unspecified, second trimester: Secondary | ICD-10-CM

## 2017-01-12 DIAGNOSIS — O24112 Pre-existing diabetes mellitus, type 2, in pregnancy, second trimester: Secondary | ICD-10-CM

## 2017-01-12 DIAGNOSIS — F17200 Nicotine dependence, unspecified, uncomplicated: Secondary | ICD-10-CM

## 2017-01-12 DIAGNOSIS — O24119 Pre-existing diabetes mellitus, type 2, in pregnancy, unspecified trimester: Secondary | ICD-10-CM

## 2017-01-12 MED ORDER — INSULIN DETEMIR 100 UNIT/ML ~~LOC~~ SOLN: 40.0000 [IU] | Freq: Every day | SUBCUTANEOUS | 11 refills | Status: DC

## 2017-01-12 NOTE — Progress Notes (Signed)
   PRENATAL VISIT NOTE  Subjective:  Kathryn Mayo is a 31 y.o. [phone] at 2890w4d being seen today for ongoing prenatal care.  She is currently monitored for the following issues for this high-risk pregnancy and has History of miscarriage; Supervision of high-risk pregnancy; Depression affecting pregnancy; PTSD (post-traumatic stress disorder); Obesity affecting pregnancy; Type 2 diabetes mellitus affecting pregnancy, antepartum; Poor dentition; Smoker; and Rh negative, antepartum on her problem list.  Patient reports Pateint has not been able to pick up her levemir, because it was written as lantus and it is not covered by her insurance. She is very frusterated when her sugars come back elevated and so has not been checking often. When she does they are 130's in AM and 150's post prandial. .  Contractions: Not present. Vag. Bleeding: None.  Movement: Present. Denies leaking of fluid.   The following portions of the patient's history were reviewed and updated as appropriate: allergies, current medications, past family history, past medical history, past social history, past surgical history and problem list. Problem list updated.  Objective:   Vitals:   01/12/17 0847  BP: 123/78  Pulse: 93  Weight: 224 lb 11.2 oz (101.9 kg)    Fetal Status: Fetal Heart Rate (bpm): 155   Movement: Present     General:  Alert, oriented and cooperative. Patient is in no acute distress.  Skin: Skin is warm and dry. No rash noted.   Cardiovascular: Normal heart rate noted  Respiratory: Normal respiratory effort, no problems with respiration noted  Abdomen: Soft, gravid, appropriate for gestational age. Pain/Pressure: Present     Pelvic:  Cervical exam deferred        Extremities: Normal range of motion.  Edema: None  Mental Status: Normal mood and affect. Normal behavior. Normal judgment and thought content.   Assessment and Plan:  Pregnancy: [id number] at 4190w4d  1. Supervision of high risk pregnancy in  second trimester - up to date on labs follow up in 2 wks for sugar check  2. Smoker Down to 5 cigs a day from 7   3. Type 2 diabetes mellitus affecting pregnancy, antepartum -continue Humalog 8 with meals -stop glyburide and start levemir 40 daily -encouraged post prandial exercise -growth US scheduled -fetal echo scheduled -eye exam preformed and normal  Term labor symptoms and general obstetric precautions including but not limited to vaginal bleeding, contractions, leaking of fluid and fetal movement were reviewed in detail with the patient. Please refer to After Visit Summary for other counseling recommendations.  Return in about 2 weeks (around 01/26/2017) for HROB.   Lorne SkeensNicholas Michael Yahmir Sokolov, MD

## 2017-01-12 NOTE — Progress Notes (Signed)
Back pain

## 2017-01-12 NOTE — Progress Notes (Signed)
Pt needs rx for levemir, was unable to get it because it was written as lantus.

## 2017-01-12 NOTE — Patient Instructions (Signed)
Diabetes Mellitus and Food It is important for you to manage your blood sugar (glucose) level. Your blood glucose level can be greatly affected by what you eat. Eating healthier foods in the appropriate amounts throughout the day at about the same time each day will help you control your blood glucose level. It can also help slow or prevent worsening of your diabetes mellitus. Healthy eating may even help you improve the level of your blood pressure and reach or maintain a healthy weight. General recommendations for healthful eating and cooking habits include:  Eating meals and snacks regularly. Avoid going long periods of time without eating to lose weight.  Eating a diet that consists mainly of plant-based foods, such as fruits, vegetables, nuts, legumes, and whole grains.  Using low-heat cooking methods, such as baking, instead of high-heat cooking methods, such as deep frying.  Work with your dietitian to make sure you understand how to use the Nutrition Facts information on food labels. How can food affect me? Carbohydrates Carbohydrates affect your blood glucose level more than any other type of food. Your dietitian will help you determine how many carbohydrates to eat at each meal and teach you how to count carbohydrates. Counting carbohydrates is important to keep your blood glucose at a healthy level, especially if you are using insulin or taking certain medicines for diabetes mellitus. Alcohol Alcohol can cause sudden decreases in blood glucose (hypoglycemia), especially if you use insulin or take certain medicines for diabetes mellitus. Hypoglycemia can be a life-threatening condition. Symptoms of hypoglycemia (sleepiness, dizziness, and disorientation) are similar to symptoms of having too much alcohol. If your health care provider has given you approval to drink alcohol, do so in moderation and use the following guidelines:  Women should not have more than one drink per day, and men  should not have more than two drinks per day. One drink is equal to: ? 12 oz of beer. ? 5 oz of wine. ? 1 oz of hard liquor.  Do not drink on an empty stomach.  Keep yourself hydrated. Have water, diet soda, or unsweetened iced tea.  Regular soda, juice, and other mixers might contain a lot of carbohydrates and should be counted.  What foods are not recommended? As you make food choices, it is important to remember that all foods are not the same. Some foods have fewer nutrients per serving than other foods, even though they might have the same number of calories or carbohydrates. It is difficult to get your body what it needs when you eat foods with fewer nutrients. Examples of foods that you should avoid that are high in calories and carbohydrates but low in nutrients include:  Trans fats (most processed foods list trans fats on the Nutrition Facts label).  Regular soda.  Juice.  Candy.  Sweets, such as cake, pie, doughnuts, and cookies.  Fried foods.  What foods can I eat? Eat nutrient-rich foods, which will nourish your body and keep you healthy. The food you should eat also will depend on several factors, including:  The calories you need.  The medicines you take.  Your weight.  Your blood glucose level.  Your blood pressure level.  Your cholesterol level.  You should eat a variety of foods, including:  Protein. ? Lean cuts of meat. ? Proteins low in saturated fats, such as fish, egg whites, and beans. Avoid processed meats.  Fruits and vegetables. ? Fruits and vegetables that may help control blood glucose levels, such as apples,   mangoes, and yams.  Dairy products. ? Choose fat-free or low-fat dairy products, such as milk, yogurt, and cheese.  Grains, bread, pasta, and rice. ? Choose whole grain products, such as multigrain bread, whole oats, and brown rice. These foods may help control blood pressure.  Fats. ? Foods containing healthful fats, such as  nuts, avocado, olive oil, canola oil, and fish.  Does everyone with diabetes mellitus have the same meal plan? Because every person with diabetes mellitus is different, there is not one meal plan that works for everyone. It is very important that you meet with a dietitian who will help you create a meal plan that is just right for you. This information is not intended to replace advice given to you by your health care provider. Make sure you discuss any questions you have with your health care provider. Document Released: 07/23/2005 Document Revised: 04/02/2016 Document Reviewed: 09/22/2013 Elsevier Interactive Patient Education  2017 [employer] Inc.  

## 2017-01-14 NOTE — Progress Notes (Signed)
This encounter was created in error - please disregard.

## 2017-01-15 ENCOUNTER — Encounter (HOSPITAL_COMMUNITY): Payer: Self-pay

## 2017-01-15 ENCOUNTER — Ambulatory Visit (HOSPITAL_COMMUNITY)
Admission: RE | Admit: 2017-01-15 | Discharge: 2017-01-15 | Disposition: A | Discharge status: Home / Self Care | Payer: Medicare Other | Source: Ambulatory Visit | Attending: Obstetrics & Gynecology | Admitting: Obstetrics & Gynecology

## 2017-01-15 ENCOUNTER — Other Ambulatory Visit (HOSPITAL_COMMUNITY): Payer: Self-pay | Admitting: *Deleted

## 2017-01-15 DIAGNOSIS — Z3A22 22 weeks gestation of pregnancy: Secondary | ICD-10-CM | POA: Insufficient documentation

## 2017-01-15 DIAGNOSIS — O34211 Maternal care for low transverse scar from previous cesarean delivery: Secondary | ICD-10-CM | POA: Diagnosis not present

## 2017-01-15 DIAGNOSIS — O09292 Supervision of pregnancy with other poor reproductive or obstetric history, second trimester: Secondary | ICD-10-CM | POA: Insufficient documentation

## 2017-01-15 DIAGNOSIS — O99332 Smoking (tobacco) complicating pregnancy, second trimester: Secondary | ICD-10-CM | POA: Insufficient documentation

## 2017-01-15 DIAGNOSIS — O24912 Unspecified diabetes mellitus in pregnancy, second trimester: Principal | ICD-10-CM

## 2017-01-15 DIAGNOSIS — O24312 Unspecified pre-existing diabetes mellitus in pregnancy, second trimester: Secondary | ICD-10-CM | POA: Diagnosis not present

## 2017-01-15 DIAGNOSIS — O24119 Pre-existing diabetes mellitus, type 2, in pregnancy, unspecified trimester: Secondary | ICD-10-CM

## 2017-01-15 DIAGNOSIS — Z794 Long term (current) use of insulin: Principal | ICD-10-CM

## 2017-01-15 DIAGNOSIS — Z7984 Long term (current) use of oral hypoglycemic drugs: Secondary | ICD-10-CM

## 2017-01-15 DIAGNOSIS — IMO0001 Reserved for inherently not codable concepts without codable children: Secondary | ICD-10-CM

## 2017-01-15 NOTE — Consult Note (Signed)
MFM note  Kathryn Mayo returns for follow up.  See not from 01/01/1027.  In summary, [name] is a 31 yo [id number], EDD 05/21/2017 who is currently at 6122 w 0d seen with poorly controlled type 2 diabetes in pregnancy.  Kathryn Mayo reports that she was first diagnosed with diabetes in 2003 following the delivery of term stillborn infant that weighed > 9 lbs.  Periodically, she has been treated with insulin / metformin.  Several years ago, she saw her primary care physician and was given the diagnosis of diabetic neuropathy.  She made some major life changes at that point - became a vegetarian and lost approximately 80 lbs.  Her initial HbA1C was 5.5 but since becoming pregnant she has found it increasingly difficult to achieve good control of her fingerstick values.  At last visit, [name] was started on Levimir insulin 40 units qHS and Novolog 8 units with each meal.  She reports a skin reaction to her insulin both on her abdomen and on her upper thighs that is red and pruritic.    Review of fingersticks - all fasting values elevated in 150's.  Fingerstick value this AM was 191.  All post prandial values are elevated, but < 200.  Vitals:   01/15/17 1017  BP: 127/69  Pulse: 98    Abdomen: erythematous rash at injection sites on abdomen and upper thigh   Impression/ Plan: 1) Single IUP at 22w 0d    2) Poorly controlled type 2 diabetic - allergic reaction to insulin extremely unusual. ? Additive to insulin.  Will change to NPH and regular insulin to determine if this is better tolerated. New dose:  32 units NPH, 16 units regular in AM; 12 units regular at dinner; 12 units NPH before bed.  Will also add Metformin 500 mg BID as insulin sensitizer.  The patient will likely need to have her dose titrated upward.  Will have her return for MFM consult follow up next week to re-evaluate.  Alpha GulaPaul Aryianna Earwood, MD Maternal Fetal Medicine

## 2017-01-18 ENCOUNTER — Telehealth: Payer: Self-pay | Admitting: *Deleted

## 2017-01-18 NOTE — Telephone Encounter (Signed)
I called [facility] pharmacy and they stated they had sent a form for us to complete and fax back re:   Approval for strips.  I found the form and pharmacy said had to have Dr. Denyce RobertStinson's signature on it not a stamp.  I sent Dr. Adrian BlackwaterStinson a urgent message to come sign the form- he is on duty tomorrow.   I called Kathryn Mayo and I expressed support for her frustration with having to call us, get forms filled out for preapproval, going to pharmacy but we discussed it it very important to her and her baby to check her sugars and take her insulin as ordered. I explained I had called pharmacy, gotten the form, and plan to have MD sign it tomorrow and send to pharmacy. We discussed could be fatal to her baby and is very important to check her sugars and take her medicine and to let us know if she is having issues getting her medicines/supplies.  She states she is done with this- she is not going to call us again - she has too much going on. She states she was able to get her meter/ strips initially but when she went to get refill would n't do it without the form for prior approval. We discussed that sometimes insurance companies require certain things like forms that have to be filled out first.  I emphasized I know it is frustrating but it is very important to check her blood sugars, take her insulin, and let us know if she is having issues getting her meds / supplies. She also admitted she has not been taking her blood sugars like she should because it was about 45 days before she needed refill. I reviewed her upcoming appointments with her.

## 2017-01-18 NOTE — Telephone Encounter (Signed)
Kathryn Mayo left a voice message this am stating she is calling re: needing a prescription refill . States pharmacy said they need a form filled out by doctor to refill her lancets. States she is getting stressed out having to go to pharmacy , being told we need to get prior approval , and then calling us about her insulin , lancets, etc. States she is supposed to be on insulin and is probably not going to check her blood sugars any more this pregnancy  And just going to have high blood sugars. States she has a lot going on .

## 2017-01-19 NOTE — Telephone Encounter (Signed)
Form has been signed.

## 2017-01-20 ENCOUNTER — Telehealth: Payer: Self-pay | Admitting: *Deleted

## 2017-01-20 NOTE — Telephone Encounter (Addendum)
Medicare form was signed by Dr. Adrian BlackwaterStinson and faxed to pharmacy for strips, etc.   I called pharmacy to let them know I faxed it.  It was faxed to number on form which is corporate number. Pharmacist stated would call corporate and verify from sent then will  Rerun rx and fill rx and notify patient.   Called patient and left  Message she should be hearing from pharmacy later today re: rx and if any issues to please call us to notify us.

## 2017-01-22 ENCOUNTER — Ambulatory Visit (HOSPITAL_COMMUNITY): Payer: Medicare Other

## 2017-01-26 ENCOUNTER — Encounter (HOSPITAL_COMMUNITY): Payer: Self-pay

## 2017-01-26 ENCOUNTER — Ambulatory Visit (INDEPENDENT_AMBULATORY_CARE_PROVIDER_SITE_OTHER): Payer: Medicare Other | Admitting: Obstetrics and Gynecology

## 2017-01-26 ENCOUNTER — Ambulatory Visit (HOSPITAL_COMMUNITY)
Admission: RE | Admit: 2017-01-26 | Discharge: 2017-01-26 | Disposition: A | Discharge status: Home / Self Care | Payer: Medicare Other | Source: Ambulatory Visit | Attending: Obstetrics & Gynecology | Admitting: Obstetrics & Gynecology

## 2017-01-26 ENCOUNTER — Encounter: Payer: Self-pay | Admitting: Obstetrics and Gynecology

## 2017-01-26 VITALS — BP 118/61 | HR 97

## 2017-01-26 VITALS — BP 117/64 | HR 92 | Wt 234.2 lb

## 2017-01-26 DIAGNOSIS — Z794 Long term (current) use of insulin: Secondary | ICD-10-CM | POA: Diagnosis not present

## 2017-01-26 DIAGNOSIS — O09292 Supervision of pregnancy with other poor reproductive or obstetric history, second trimester: Secondary | ICD-10-CM

## 2017-01-26 DIAGNOSIS — O0992 Supervision of high risk pregnancy, unspecified, second trimester: Secondary | ICD-10-CM

## 2017-01-26 DIAGNOSIS — O09299 Supervision of pregnancy with other poor reproductive or obstetric history, unspecified trimester: Secondary | ICD-10-CM

## 2017-01-26 DIAGNOSIS — O26899 Other specified pregnancy related conditions, unspecified trimester: Secondary | ICD-10-CM

## 2017-01-26 DIAGNOSIS — O24112 Pre-existing diabetes mellitus, type 2, in pregnancy, second trimester: Secondary | ICD-10-CM

## 2017-01-26 DIAGNOSIS — Z3A23 23 weeks gestation of pregnancy: Secondary | ICD-10-CM | POA: Diagnosis not present

## 2017-01-26 DIAGNOSIS — O24119 Pre-existing diabetes mellitus, type 2, in pregnancy, unspecified trimester: Secondary | ICD-10-CM

## 2017-01-26 DIAGNOSIS — Z6791 Unspecified blood type, Rh negative: Secondary | ICD-10-CM

## 2017-01-26 NOTE — Progress Notes (Signed)
[name] comes in today for follow up. She is at 23+4 weeks and has type 2 diabetes. Please see Dr. Fredda HammedWhitecar's notes from 01/01/17 and 01/15/17. Today she continues to complain of local allergic reactions to he insulin despite the change in insulin type. She brought in a picture of the reaction, which she states is intensely pruritic for about an hour. I appeared erythematous and raised on the picture. She has stopped taking her insulin and has not been checking her blood sugars. I asked her to begin glyburide 5mg  bid along with her metformin 500mg  BID and to restart checking blood sugars ASAP. We dicussed the importance of good glycemic control and the need for adequate BS checks. She will return in 1 week with her blood sugar records  Greater than 50% of today's 10 minute visit was spent in counseling and planning with the patient  Lynford HumphreyMark G. Ezzard StandingNewman, MD

## 2017-01-26 NOTE — Progress Notes (Signed)
   PRENATAL VISIT NOTE  Subjective:  Kathryn Mayo is a 31 y.o. [phone] at 6520w4d being seen today for ongoing prenatal care.  She is currently monitored for the following issues for this high-risk pregnancy and has History of miscarriage; Supervision of high-risk pregnancy; Depression affecting pregnancy; PTSD (post-traumatic stress disorder); Obesity affecting pregnancy; Type 2 diabetes mellitus affecting pregnancy, antepartum; Poor dentition; Smoker; Rh negative, antepartum; and Prior pregnancy with fetal demise on her problem list.  Patient reports no complaints.  Contractions: Irritability. Vag. Bleeding: None.  Movement: Present. Denies leaking of fluid.   The following portions of the patient's history were reviewed and updated as appropriate: allergies, current medications, past family history, past medical history, past social history, past surgical history and problem list. Problem list updated.  Objective:   Vitals:   01/26/17 1358  BP: 118/61  Pulse: 97    Fetal Status: Fetal Heart Rate (bpm): 150   Movement: Present     General:  Alert, oriented and cooperative. Patient is in no acute distress.  Skin: Skin is warm and dry. No rash noted.   Cardiovascular: Normal heart rate noted  Respiratory: Normal respiratory effort, no problems with respiration noted  Abdomen: Soft, gravid, appropriate for gestational age. Pain/Pressure: Present     Pelvic:  Cervical exam deferred        Extremities: Normal range of motion.  Edema: Trace  Mental Status: Normal mood and affect. Normal behavior. Normal judgment and thought content.   Assessment and Plan:  Pregnancy: [id number] at 6920w4d  1. Supervision of high risk pregnancy in second trimester Patient is doing well without complaints  2. Type 2 diabetes mellitus affecting pregnancy, antepartum Patient has not checked CBGs in 2 weeks because she was not able to obtain lancets She also stopped her insulin due to a local reaction at site  of injections.  She was re-started on glyburide and metformin by MFM today Discussed with patient the importance of adhering to diet and incorporating exercise Also explained to the patient that if new glyburide/metformin is not sufficient in controlling her diabetes, she will have to go back to insulin and we may have to add benadryl to counteract her transient allergic reaction Follow up growth scan on 4/6 and fetal echo on 4/6  3. Rh negative, antepartum Rhogam at 28 weeks  4. Prior pregnancy with fetal demise   Preterm labor symptoms and general obstetric precautions including but not limited to vaginal bleeding, contractions, leaking of fluid and fetal movement were reviewed in detail with the patient. Please refer to After Visit Summary for other counseling recommendations.  Return in about 2 weeks (around 02/09/2017).   Catalina AntiguaPeggy Naser Schuld, MD

## 2017-01-26 NOTE — Progress Notes (Signed)
Patient states that she hasn't checked her blood sugar in 2 weeks because she needs an authorization from the doctor. Patient states that MAU doctor discontinued her insulin.

## 2017-01-30 ENCOUNTER — Other Ambulatory Visit: Payer: Self-pay

## 2017-02-01 ENCOUNTER — Encounter: Payer: Self-pay | Admitting: Obstetrics & Gynecology

## 2017-02-02 ENCOUNTER — Other Ambulatory Visit (HOSPITAL_COMMUNITY): Payer: Self-pay | Admitting: *Deleted

## 2017-02-02 ENCOUNTER — Encounter (HOSPITAL_COMMUNITY): Payer: Self-pay

## 2017-02-02 ENCOUNTER — Ambulatory Visit (HOSPITAL_COMMUNITY)
Admission: RE | Admit: 2017-02-02 | Discharge: 2017-02-02 | Disposition: A | Discharge status: Home / Self Care | Payer: Medicare Other | Source: Ambulatory Visit | Attending: Obstetrics & Gynecology | Admitting: Obstetrics & Gynecology

## 2017-02-02 ENCOUNTER — Telehealth: Payer: Self-pay | Admitting: General Practice

## 2017-02-02 VITALS — BP 118/56 | HR 97 | Wt 234.0 lb

## 2017-02-02 DIAGNOSIS — E119 Type 2 diabetes mellitus without complications: Secondary | ICD-10-CM | POA: Insufficient documentation

## 2017-02-02 DIAGNOSIS — O0992 Supervision of high risk pregnancy, unspecified, second trimester: Secondary | ICD-10-CM

## 2017-02-02 DIAGNOSIS — Z794 Long term (current) use of insulin: Secondary | ICD-10-CM | POA: Insufficient documentation

## 2017-02-02 DIAGNOSIS — Z3A24 24 weeks gestation of pregnancy: Secondary | ICD-10-CM | POA: Diagnosis not present

## 2017-02-02 DIAGNOSIS — O24119 Pre-existing diabetes mellitus, type 2, in pregnancy, unspecified trimester: Secondary | ICD-10-CM

## 2017-02-02 DIAGNOSIS — Z888 Allergy status to other drugs, medicaments and biological substances status: Secondary | ICD-10-CM | POA: Insufficient documentation

## 2017-02-02 DIAGNOSIS — O24112 Pre-existing diabetes mellitus, type 2, in pregnancy, second trimester: Secondary | ICD-10-CM | POA: Diagnosis not present

## 2017-02-02 NOTE — Progress Notes (Addendum)
[name] comes in today for follow up. She is at 24+4 weeks and has type 2 diabetes. Please see Dr. Fredda HammedWhitecar's notes from 01/01/17 and 01/15/17 and my note from 01/26/17. Today she brought in her blood sugars which are only intermittant, but continue elevated across the board. she had one elevation to 204. She took some insulin at that point and had a similar local allergic reaction as desribed previously.  I have asked her to be more compliant with QID blood sugar testing.  I have written her a prescription for combo glyburide 5mg  5mg /metformin 500mg  to take 2 of these BID. I reinforced the importance of good glycemic control and the need for adequate BS checks. She will return in 1 week with her blood sugar records   Greater than 50% of the 10 minute visit was spent in counseling the patient

## 2017-02-02 NOTE — Telephone Encounter (Signed)
Patient called and left message stating she is still waiting for a form to be completed so she can pick her up lancets. I have personally called the pharmacy twice last week and today requesting CNM form for medicare but our office has yet to receive. [facility] states they are faxing the form over now and once completed they will contact medicare. Called patient, no answer- left message stating we are trying to reach her concerning her call. We have been working with your pharmacy since your last visit trying to obtain this form. Your pharmacy states they are faxing the form over to us today for us to complete. We just want to let you know we are working to resolve this and we do apologize for the delay. You may call us back if you have questions or concerns

## 2017-02-12 ENCOUNTER — Ambulatory Visit (HOSPITAL_COMMUNITY)
Admission: RE | Admit: 2017-02-12 | Discharge: 2017-02-12 | Disposition: A | Discharge status: Home / Self Care | Payer: Medicare Other | Source: Ambulatory Visit | Attending: Obstetrics & Gynecology | Admitting: Obstetrics & Gynecology

## 2017-02-12 ENCOUNTER — Other Ambulatory Visit (HOSPITAL_COMMUNITY): Payer: Self-pay | Admitting: Maternal and Fetal Medicine

## 2017-02-12 ENCOUNTER — Ambulatory Visit (HOSPITAL_COMMUNITY): Payer: Medicare Other

## 2017-02-12 ENCOUNTER — Encounter (HOSPITAL_COMMUNITY): Payer: Self-pay

## 2017-02-12 DIAGNOSIS — O24912 Unspecified diabetes mellitus in pregnancy, second trimester: Secondary | ICD-10-CM

## 2017-02-12 DIAGNOSIS — O09299 Supervision of pregnancy with other poor reproductive or obstetric history, unspecified trimester: Secondary | ICD-10-CM

## 2017-02-12 DIAGNOSIS — IMO0001 Reserved for inherently not codable concepts without codable children: Secondary | ICD-10-CM

## 2017-02-12 DIAGNOSIS — Z3A26 26 weeks gestation of pregnancy: Secondary | ICD-10-CM

## 2017-02-12 DIAGNOSIS — E1165 Type 2 diabetes mellitus with hyperglycemia: Secondary | ICD-10-CM | POA: Diagnosis not present

## 2017-02-12 DIAGNOSIS — O34219 Maternal care for unspecified type scar from previous cesarean delivery: Secondary | ICD-10-CM | POA: Insufficient documentation

## 2017-02-12 DIAGNOSIS — O99332 Smoking (tobacco) complicating pregnancy, second trimester: Secondary | ICD-10-CM

## 2017-02-12 DIAGNOSIS — Z794 Long term (current) use of insulin: Secondary | ICD-10-CM

## 2017-02-12 DIAGNOSIS — Z6791 Unspecified blood type, Rh negative: Secondary | ICD-10-CM

## 2017-02-12 DIAGNOSIS — O24112 Pre-existing diabetes mellitus, type 2, in pregnancy, second trimester: Secondary | ICD-10-CM | POA: Insufficient documentation

## 2017-02-12 DIAGNOSIS — O26899 Other specified pregnancy related conditions, unspecified trimester: Secondary | ICD-10-CM

## 2017-02-12 DIAGNOSIS — O0992 Supervision of high risk pregnancy, unspecified, second trimester: Secondary | ICD-10-CM

## 2017-02-12 NOTE — Progress Notes (Signed)
MATERNAL FETAL MEDICINE CONSULT  Patient Name: Kathryn Mayo Medical Record Number:  [id number] Date of Birth: 1986/05/02 Requesting Physician Name:  Lesly Dukes, MD Date of Service: 02/12/2017  Chief Complaint Poorly controlled type II DM in pregnancy  History of Present Illness Kathryn Mayo was seen today secondary to poorly controlled type II DM at the request of Lesly Dukes, MD.  The patient is a 31 y.o. [id number],HY [redacted]w[redacted]d with an EDD of 05/21/2017, by Last Menstrual Period dating method.  Since her last visit she has not been checking her blood sugar as she was not able to get lancets from her pharmacy due to red tape.  She has been taking metformin and glyburide as directed.  However, she is almost out of glyburide and has not obtained more as she requires an authorization for a 90 day supply refill due to her insurance.  She has no acute issues or complaints today.  Review of Systems Pertinent items are noted in HPI.  Patient History OB History  Gravida Para Term Preterm AB Living  0 4 2  SAB TAB Ectopic Multiple Live Births  # Outcome Date GA Lbr Len/2nd Weight Sex Delivery Anes PTL Lv  8 Current           7 SAB 2016          6 SAB 2015          5 TAB 2008          4 Term 2007    F CS-Unspec Spinal  LIV  3 Term 2005    M CS-Unspec   LIV  2 TAB 2004          1 Term 2002 [redacted]w[redacted]d   F    FD      Past Medical History:  Diagnosis Date  . Diabetes mellitus without complication (HCC)    2 types of oral, 2 types of insulin- no longer taking.  was old A1C is normal  . Heart murmur    when young  . Infection    uti  . Neuropathy (HCC)   . Vaginal Pap smear, abnormal    age 89, doesn't think she has had one since    Past Surgical History:  Procedure Laterality Date  . CESAREAN SECTION     C/S x 2  . THERAPEUTIC ABORTION    . WISDOM TOOTH EXTRACTION      Social History   Social History  . Marital status: Single    Spouse name: N/A  .  Number of children: N/A  . Years of education: N/A   Social History Main Topics  . Smoking status: Current Every Day Smoker    Packs/day: 0.25    Years: 10.00    Types: Cigarettes, E-cigarettes  . Smokeless tobacco: Never Used     Comment: trying to stop  . Alcohol use No     Comment: 05/05/2013  . Drug use: Yes    Types: Marijuana     Comment: last used 2 days ago 10/24/16  . Sexual activity: Yes    Birth control/ protection: None   Other Topics Concern  . Not on file   Social History Narrative  . No narrative on file    Family History  Problem Relation Age of Onset  . Asthma Mother   . Drug abuse Mother     died of overdose  .  HIV/AIDS Mother     21  . Asthma Daughter   . Diabetes Maternal Grandmother   . Cancer Maternal Grandmother     breast   In addition, the patient has no family history of mental retardation, birth defects, or genetic diseases.  Physical Examination There were no vitals filed for this visit. General appearance - alert, well appearing, and in no distress Mental status - alert, oriented to person, place, and time  Assessment and Recommendations 1.  Type II DM.  I have completed the 90 day supply authorization for her pharmacy so she can continue glyburide for the time being.  However, I think it will be best for her to be back on insulin as I suspect oral hypoglycemic will not adequately control her blood sugar until term.  Thus, I have placed an endocrinology consult as I think they would be best suited to determine if an alternative form of insulin could be used that would not lead to a skin reaction as her previous insulins have.  [name] reports she had a normal fetal echo today.  In addition, fetal growth was normal on today's ultrasound.  She should have monthly growth ultrasounds and antenatal testing beginning at 32 weeks. 2.  Mode of delivery.  [name] incidentally inquired about the risk of attempting a vaginal delivery after having  had 2 prior LTCS.  Based on the [employer] VBAC success calculator [name] has an approximately 60% chance of a successful vaginal delivery.  Evidence has shown that women with an a priori chance of success of less than 70% are at the greatest risk of complications.  This coupled with the fact that she had 2 prior cesarean section makes Ms. Juanetta Beets overall chance of complication relatively high.  However, it is not sufficiently high to totally preclude an attempt at a vaginal delivery.  Although, I would not recommend she attempt a vaginal delivery, it is not an unreasonable option provided the issue is readdressed with [name] closer to delivery and she fully understands the risk and nature of the complications of a TOLAC.  I spent 30 minutes with [name] today of which 50% was face-to-face counseling.  Thank you for referring [name] to the [facility].  Please do not hesitate to contact us with questions.   Rema Fendt, MD

## 2017-02-12 NOTE — ED Notes (Signed)
Pt here today without BG log and states she hasn't been checking her blood glucose.  Only using metformin.  Every once and a while she will take the glyburide.  Pt states she had two pills left.  Dr. Otho Perl advised and will be meeting for MD consult after ultrasound today to reinforce the need to log/check blood sugars and decide on medication.

## 2017-02-12 NOTE — ED Notes (Signed)
Metformin and Glyburide 90 day supply RX faxed to [facility] after pt visit with Dr. Otho Perl today.  Confirmation request received.  Pt notified of the RX sent over.

## 2017-02-15 ENCOUNTER — Telehealth: Payer: Self-pay | Admitting: Family Medicine

## 2017-02-15 ENCOUNTER — Encounter: Payer: Self-pay | Admitting: Family Medicine

## 2017-02-15 ENCOUNTER — Other Ambulatory Visit (HOSPITAL_COMMUNITY): Payer: Self-pay | Admitting: *Deleted

## 2017-02-15 DIAGNOSIS — O365920 Maternal care for other known or suspected poor fetal growth, second trimester, not applicable or unspecified: Secondary | ICD-10-CM

## 2017-02-15 NOTE — Telephone Encounter (Signed)
Called patient to get her rescheduled for missed appointment. Had to leave a message on her voicemail. Will send certified letter.

## 2017-02-24 ENCOUNTER — Ambulatory Visit (INDEPENDENT_AMBULATORY_CARE_PROVIDER_SITE_OTHER): Payer: Medicare Other | Admitting: Advanced Practice Midwife

## 2017-02-24 ENCOUNTER — Other Ambulatory Visit (HOSPITAL_COMMUNITY)
Admission: RE | Admit: 2017-02-24 | Discharge: 2017-02-24 | Disposition: A | Discharge status: Home / Self Care | Payer: Medicare Other | Source: Ambulatory Visit | Attending: Advanced Practice Midwife | Admitting: Advanced Practice Midwife

## 2017-02-24 VITALS — BP 120/61 | HR 85 | Wt 237.3 lb

## 2017-02-24 DIAGNOSIS — N898 Other specified noninflammatory disorders of vagina: Secondary | ICD-10-CM | POA: Diagnosis not present

## 2017-02-24 DIAGNOSIS — O99712 Diseases of the skin and subcutaneous tissue complicating pregnancy, second trimester: Secondary | ICD-10-CM

## 2017-02-24 DIAGNOSIS — O4702 False labor before 37 completed weeks of gestation, second trimester: Secondary | ICD-10-CM

## 2017-02-24 DIAGNOSIS — Z113 Encounter for screening for infections with a predominantly sexual mode of transmission: Secondary | ICD-10-CM | POA: Diagnosis not present

## 2017-02-24 DIAGNOSIS — L292 Pruritus vulvae: Secondary | ICD-10-CM | POA: Diagnosis not present

## 2017-02-24 DIAGNOSIS — O09299 Supervision of pregnancy with other poor reproductive or obstetric history, unspecified trimester: Secondary | ICD-10-CM

## 2017-02-24 DIAGNOSIS — O360920 Maternal care for other rhesus isoimmunization, second trimester, not applicable or unspecified: Secondary | ICD-10-CM | POA: Diagnosis present

## 2017-02-24 DIAGNOSIS — O26892 Other specified pregnancy related conditions, second trimester: Secondary | ICD-10-CM | POA: Diagnosis present

## 2017-02-24 DIAGNOSIS — O24112 Pre-existing diabetes mellitus, type 2, in pregnancy, second trimester: Secondary | ICD-10-CM | POA: Diagnosis not present

## 2017-02-24 DIAGNOSIS — O0992 Supervision of high risk pregnancy, unspecified, second trimester: Secondary | ICD-10-CM

## 2017-02-24 DIAGNOSIS — O26899 Other specified pregnancy related conditions, unspecified trimester: Secondary | ICD-10-CM

## 2017-02-24 DIAGNOSIS — O09292 Supervision of pregnancy with other poor reproductive or obstetric history, second trimester: Secondary | ICD-10-CM

## 2017-02-24 DIAGNOSIS — O9989 Other specified diseases and conditions complicating pregnancy, childbirth and the puerperium: Secondary | ICD-10-CM

## 2017-02-24 DIAGNOSIS — E119 Type 2 diabetes mellitus without complications: Secondary | ICD-10-CM | POA: Diagnosis not present

## 2017-02-24 DIAGNOSIS — Z3A27 27 weeks gestation of pregnancy: Secondary | ICD-10-CM | POA: Insufficient documentation

## 2017-02-24 DIAGNOSIS — O24119 Pre-existing diabetes mellitus, type 2, in pregnancy, unspecified trimester: Secondary | ICD-10-CM

## 2017-02-24 DIAGNOSIS — O09892 Supervision of other high risk pregnancies, second trimester: Secondary | ICD-10-CM

## 2017-02-24 DIAGNOSIS — L299 Pruritus, unspecified: Secondary | ICD-10-CM

## 2017-02-24 DIAGNOSIS — Z6791 Unspecified blood type, Rh negative: Secondary | ICD-10-CM

## 2017-02-24 LAB — POCT URINALYSIS DIP (DEVICE)
Bilirubin Urine: NEGATIVE
Glucose, UA: 500 mg/dL — AB
Hgb urine dipstick: NEGATIVE
Ketones, ur: 15 mg/dL — AB
Leukocytes, UA: NEGATIVE
NITRITE: NEGATIVE
PROTEIN: NEGATIVE mg/dL
Specific Gravity, Urine: 1.02 (ref 1.005–1.030)
UROBILINOGEN UA: 0.2 mg/dL (ref 0.0–1.0)
pH: 7.5 (ref 5.0–8.0)

## 2017-02-24 LAB — FETAL FIBRONECTIN: Fetal Fibronectin: NEGATIVE

## 2017-02-24 LAB — GLUCOSE, CAPILLARY: Glucose-Capillary: 209 mg/dL — ABNORMAL HIGH (ref 65–99)

## 2017-02-24 MED ORDER — FLUCONAZOLE 150 MG PO TABS: 150.0000 mg | ORAL_TABLET | ORAL | 3 refills | Status: DC | PRN

## 2017-02-24 MED ORDER — TERCONAZOLE 0.4 % VA CREA: 1.0000 | TOPICAL_CREAM | Freq: Every day | VAGINAL | 1 refills | Status: DC

## 2017-02-24 MED ORDER — RHO D IMMUNE GLOBULIN 1500 UNIT/2ML IJ SOSY
300.0000 ug | PREFILLED_SYRINGE | Freq: Once | INTRAMUSCULAR | Status: AC
  Administered 2017-02-24: 300 ug via INTRAMUSCULAR

## 2017-02-24 NOTE — Addendum Note (Signed)
Addended by: Cheree Ditto, Chipper Koudelka A on: 02/24/2017 01:18 PM   Modules accepted: Orders

## 2017-02-24 NOTE — Progress Notes (Signed)
Declined TDAP  Vaginal itching

## 2017-02-24 NOTE — Progress Notes (Signed)
PRENATAL VISIT NOTE  Subjective:  Kathryn Mayo is a 31 y.o. S8P1031 at 56w5dbeing seen today for ongoing prenatal care.  She is currently monitored for the following issues for this high-risk pregnancy and has History of miscarriage; Supervision of high-risk pregnancy; Depression affecting pregnancy; PTSD (post-traumatic stress disorder); Obesity affecting pregnancy; Type 2 diabetes mellitus affecting pregnancy, antepartum; Poor dentition; Smoker; Rh negative, antepartum; Prior pregnancy with fetal demise; and Allergy to insulin on her problem list.  Patient reports vaginal irritation and itching on bilat arms, legs, abd. No rash. Also C/O irreg, increasingly painful contractions..  Contractions: Irritability. Vag. Bleeding: None.  Movement: Present. Denies leaking of fluid.   The following portions of the patient's history were reviewed and updated as appropriate: allergies, current medications, past family history, past medical history, past social history, past surgical history and problem list. Problem list updated.  Pt reports continued uncontrolled CBGs up to 200's. States she [place] to first visit a few days ago. Rx new insulin (has had allergic reaction in past), but has not been able to pick up yet due to prior auth reasons w/ Medicare. Also has been seen MFM RE: Diabetes, Hx IUFD.   Objective:   Vitals:   02/24/17 1111  BP: 120/61  Pulse: 85  Weight: 237 lb 4.8 oz (107.6 kg)    Fetal Status: Fetal Heart Rate (bpm): 144 Fundal Height: 37 cm Movement: Present     General:  Alert, oriented and cooperative. Patient is in no acute distress.  Skin: Skin is warm and dry. No rash noted. Mild excoriation an arms noted.   Cardiovascular: Normal heart rate noted  Respiratory: Normal respiratory effort, no problems with respiration noted  Abdomen: Soft, gravid, appropriate for gestational age. Pain/Pressure: Present     Pelvic:  Cervical  exam performed Dilation: Closed Effacement (%): 0 Station: Ballotable. Moderate amount of thick, white, odorless discharge  Extremities: Normal range of motion.  Edema: None  Mental Status: Normal mood and affect. Normal behavior. Normal judgment and thought content.   Random CBG 209  Assessment and Plan:  Pregnancy: [id number] 25w5d1. Vaginal itching- Pt requesting refill Diflucan - Encouraged pt to use Trarazol instead on Diflucan - Cervicovaginal ancillary only - terconazole (TERAZOL 7) 0.4 % vaginal cream; Place 1 applicator vaginally at bedtime.  Dispense: 45 g; Refill: 1. - fluconazole (DIFLUCAN) 150 MG tablet; Take 1 tablet (150 mg total) by mouth every three (3) days as needed. Can take additional dose three days later if symptoms persist  Dispense: 3 tablet; Refill: 3  2. Type 2 diabetes mellitus affecting pregnancy, antepartum  - F/U AS for labs this weeks and fill new Rx from Endo.  - [place] FETAL BPP W/NONSTRESS; Future - Continue Q4 week growth US's  3. Supervision of high risk pregnancy in second trimester  - RPR - CBC  4. Pruritus of pregnancy in second trimester  - Bile acids, total - Comp Met (CMET) - Benadryl PRN  5. Rh negative, antepartum  - Rhophylac - Antibody screen  6. Prior pregnancy with fetal demise  - [place] FETAL BPP W/NONSTRESS; Future  7 Preterm contractions  - fFN  Preterm labor symptoms and general obstetric precautions including but not limited to vaginal bleeding, contractions, leaking of fluid and fetal movement were reviewed in detail with the patient. Please refer to After Visit Summary for other counseling recommendations.  Return in about 2 weeks (around 03/10/2017) for ROB.  Manya Silvas, CNM

## 2017-02-25 LAB — CBC
HEMATOCRIT: 37.4 % (ref 34.0–46.6)
Hemoglobin: 12.8 g/dL (ref 11.1–15.9)
MCH: 28.5 pg (ref 26.6–33.0)
MCHC: 34.2 g/dL (ref 31.5–35.7)
MCV: 83 fL (ref 79–97)
Platelets: 222 10*3/uL (ref 150–379)
RBC: 4.49 x10E6/uL (ref 3.77–5.28)
RDW: 14.9 % (ref 12.3–15.4)
WBC: 6.9 10*3/uL (ref 3.4–10.8)

## 2017-02-25 LAB — CERVICOVAGINAL ANCILLARY ONLY
BACTERIAL VAGINITIS: NEGATIVE
Chlamydia: NEGATIVE
NEISSERIA GONORRHEA: NEGATIVE
Trichomonas: NEGATIVE

## 2017-02-25 LAB — COMPREHENSIVE METABOLIC PANEL
ALK PHOS: 62 IU/L (ref 39–117)
ALT: 9 IU/L (ref 0–32)
AST: 12 IU/L (ref 0–40)
Albumin/Globulin Ratio: 1.2 (ref 1.2–2.2)
Albumin: 3.7 g/dL (ref 3.5–5.5)
BUN / CREAT RATIO: 11 (ref 9–23)
BUN: 7 mg/dL (ref 6–20)
Bilirubin Total: <0.2 mg/dL (ref 0.0–1.2)
CO2: 18 mmol/L (ref 18–29)
CREATININE: 0.61 mg/dL (ref 0.57–1.00)
Calcium: 9.1 mg/dL (ref 8.7–10.2)
Chloride: 102 mmol/L (ref 96–106)
GFR calc non Af Amer: 119 mL/min/{1.73_m2} (ref >59)
GFR, EST AFRICAN AMERICAN: 138 mL/min/{1.73_m2} (ref >59)
GLUCOSE: 228 mg/dL — AB (ref 65–99)
Globulin, Total: 3.1 g/dL (ref 1.5–4.5)
Potassium: 4.6 mmol/L (ref 3.5–5.2)
Sodium: 138 mmol/L (ref 134–144)
TOTAL PROTEIN: 6.8 g/dL (ref 6.0–8.5)

## 2017-02-25 LAB — RPR: RPR Ser Ql: NONREACTIVE

## 2017-02-25 LAB — HIV ANTIBODY (ROUTINE TESTING W REFLEX): HIV Screen 4th Generation wRfx: NONREACTIVE

## 2017-02-25 LAB — BILE ACIDS, TOTAL: Bile Acids Total: 8.1 umol/L (ref 4.7–24.5)

## 2017-02-25 LAB — ANTIBODY SCREEN: ANTIBODY SCREEN: NEGATIVE

## 2017-03-01 ENCOUNTER — Telehealth: Payer: Self-pay | Admitting: *Deleted

## 2017-03-01 NOTE — Telephone Encounter (Addendum)
-----   Message from [place] sent at 02/27/2017  7:13 AM EDT ----- Please inform pt of Nml Bile acids--no Cholestasis. Also neg fFN.  Called pt and informed of test results as stated above.  She voiced understanding and had no questions.

## 2017-03-04 ENCOUNTER — Encounter (HOSPITAL_COMMUNITY): Payer: Self-pay

## 2017-03-04 ENCOUNTER — Ambulatory Visit (HOSPITAL_COMMUNITY)
Admission: RE | Admit: 2017-03-04 | Discharge: 2017-03-04 | Disposition: A | Discharge status: Home / Self Care | Payer: Medicare Other | Source: Ambulatory Visit | Attending: Advanced Practice Midwife | Admitting: Advanced Practice Midwife

## 2017-03-04 ENCOUNTER — Other Ambulatory Visit: Payer: Self-pay | Admitting: Advanced Practice Midwife

## 2017-03-04 ENCOUNTER — Other Ambulatory Visit (HOSPITAL_COMMUNITY): Payer: Self-pay | Admitting: *Deleted

## 2017-03-04 DIAGNOSIS — O09299 Supervision of pregnancy with other poor reproductive or obstetric history, unspecified trimester: Secondary | ICD-10-CM

## 2017-03-04 DIAGNOSIS — O24119 Pre-existing diabetes mellitus, type 2, in pregnancy, unspecified trimester: Secondary | ICD-10-CM

## 2017-03-04 DIAGNOSIS — O99333 Smoking (tobacco) complicating pregnancy, third trimester: Secondary | ICD-10-CM | POA: Insufficient documentation

## 2017-03-04 DIAGNOSIS — O09293 Supervision of pregnancy with other poor reproductive or obstetric history, third trimester: Secondary | ICD-10-CM | POA: Diagnosis not present

## 2017-03-04 DIAGNOSIS — O34211 Maternal care for low transverse scar from previous cesarean delivery: Secondary | ICD-10-CM | POA: Diagnosis not present

## 2017-03-04 DIAGNOSIS — O24313 Unspecified pre-existing diabetes mellitus in pregnancy, third trimester: Secondary | ICD-10-CM | POA: Insufficient documentation

## 2017-03-04 DIAGNOSIS — Z3A28 28 weeks gestation of pregnancy: Secondary | ICD-10-CM | POA: Diagnosis not present

## 2017-03-04 DIAGNOSIS — O409XX0 Polyhydramnios, unspecified trimester, not applicable or unspecified: Secondary | ICD-10-CM

## 2017-03-04 DIAGNOSIS — O0992 Supervision of high risk pregnancy, unspecified, second trimester: Secondary | ICD-10-CM

## 2017-03-04 DIAGNOSIS — O26899 Other specified pregnancy related conditions, unspecified trimester: Secondary | ICD-10-CM

## 2017-03-04 DIAGNOSIS — Z6791 Unspecified blood type, Rh negative: Secondary | ICD-10-CM

## 2017-03-04 NOTE — ED Notes (Signed)
Pt states she's not checking her blood sugar and hasn't been taking any of her insulins, glyburide, or metformin.  Pt states she was "taking a break" and realizes the importance of blood sugar control.

## 2017-03-05 ENCOUNTER — Telehealth: Payer: Self-pay | Admitting: *Deleted

## 2017-03-05 ENCOUNTER — Other Ambulatory Visit (HOSPITAL_COMMUNITY): Payer: Self-pay | Admitting: *Deleted

## 2017-03-05 ENCOUNTER — Encounter: Payer: Self-pay | Admitting: Advanced Practice Midwife

## 2017-03-05 ENCOUNTER — Encounter (HOSPITAL_COMMUNITY): Payer: Self-pay | Admitting: *Deleted

## 2017-03-05 ENCOUNTER — Inpatient Hospital Stay (HOSPITAL_COMMUNITY)
Admission: AD | Admit: 2017-03-05 | Discharge: 2017-03-06 | Disposition: A | Discharge status: Home / Self Care | Payer: Medicare Other | Source: Ambulatory Visit | Attending: Obstetrics and Gynecology | Admitting: Obstetrics and Gynecology

## 2017-03-05 DIAGNOSIS — N760 Acute vaginitis: Secondary | ICD-10-CM | POA: Insufficient documentation

## 2017-03-05 DIAGNOSIS — Z8619 Personal history of other infectious and parasitic diseases: Secondary | ICD-10-CM | POA: Diagnosis not present

## 2017-03-05 DIAGNOSIS — O4703 False labor before 37 completed weeks of gestation, third trimester: Secondary | ICD-10-CM

## 2017-03-05 DIAGNOSIS — Z87440 Personal history of urinary (tract) infections: Secondary | ICD-10-CM | POA: Diagnosis not present

## 2017-03-05 DIAGNOSIS — Z3A29 29 weeks gestation of pregnancy: Secondary | ICD-10-CM | POA: Insufficient documentation

## 2017-03-05 DIAGNOSIS — Z91013 Allergy to seafood: Secondary | ICD-10-CM | POA: Insufficient documentation

## 2017-03-05 DIAGNOSIS — O26899 Other specified pregnancy related conditions, unspecified trimester: Secondary | ICD-10-CM

## 2017-03-05 DIAGNOSIS — O9989 Other specified diseases and conditions complicating pregnancy, childbirth and the puerperium: Secondary | ICD-10-CM | POA: Diagnosis not present

## 2017-03-05 DIAGNOSIS — Z9889 Other specified postprocedural states: Secondary | ICD-10-CM | POA: Insufficient documentation

## 2017-03-05 DIAGNOSIS — Z813 Family history of other psychoactive substance abuse and dependence: Secondary | ICD-10-CM | POA: Insufficient documentation

## 2017-03-05 DIAGNOSIS — O09299 Supervision of pregnancy with other poor reproductive or obstetric history, unspecified trimester: Secondary | ICD-10-CM

## 2017-03-05 DIAGNOSIS — Z830 Family history of human immunodeficiency virus [HIV] disease: Secondary | ICD-10-CM | POA: Diagnosis not present

## 2017-03-05 DIAGNOSIS — E119 Type 2 diabetes mellitus without complications: Secondary | ICD-10-CM | POA: Insufficient documentation

## 2017-03-05 DIAGNOSIS — Z833 Family history of diabetes mellitus: Secondary | ICD-10-CM | POA: Diagnosis not present

## 2017-03-05 DIAGNOSIS — F17210 Nicotine dependence, cigarettes, uncomplicated: Secondary | ICD-10-CM | POA: Diagnosis not present

## 2017-03-05 DIAGNOSIS — Z803 Family history of malignant neoplasm of breast: Secondary | ICD-10-CM | POA: Diagnosis not present

## 2017-03-05 DIAGNOSIS — O219 Vomiting of pregnancy, unspecified: Secondary | ICD-10-CM | POA: Diagnosis not present

## 2017-03-05 DIAGNOSIS — O409XX0 Polyhydramnios, unspecified trimester, not applicable or unspecified: Secondary | ICD-10-CM | POA: Insufficient documentation

## 2017-03-05 DIAGNOSIS — O24113 Pre-existing diabetes mellitus, type 2, in pregnancy, third trimester: Secondary | ICD-10-CM | POA: Insufficient documentation

## 2017-03-05 DIAGNOSIS — Z794 Long term (current) use of insulin: Secondary | ICD-10-CM | POA: Diagnosis not present

## 2017-03-05 DIAGNOSIS — O99333 Smoking (tobacco) complicating pregnancy, third trimester: Secondary | ICD-10-CM | POA: Diagnosis not present

## 2017-03-05 DIAGNOSIS — O0992 Supervision of high risk pregnancy, unspecified, second trimester: Secondary | ICD-10-CM

## 2017-03-05 DIAGNOSIS — Z825 Family history of asthma and other chronic lower respiratory diseases: Secondary | ICD-10-CM | POA: Insufficient documentation

## 2017-03-05 DIAGNOSIS — O23593 Infection of other part of genital tract in pregnancy, third trimester: Secondary | ICD-10-CM | POA: Diagnosis not present

## 2017-03-05 DIAGNOSIS — Z6791 Unspecified blood type, Rh negative: Secondary | ICD-10-CM

## 2017-03-05 DIAGNOSIS — N898 Other specified noninflammatory disorders of vagina: Secondary | ICD-10-CM | POA: Diagnosis present

## 2017-03-05 DIAGNOSIS — B9689 Other specified bacterial agents as the cause of diseases classified elsewhere: Secondary | ICD-10-CM | POA: Diagnosis not present

## 2017-03-05 LAB — URINALYSIS, ROUTINE W REFLEX MICROSCOPIC
Bacteria, UA: NONE SEEN
Bilirubin Urine: NEGATIVE
HGB URINE DIPSTICK: NEGATIVE
Ketones, ur: NEGATIVE mg/dL
Leukocytes, UA: NEGATIVE
Nitrite: NEGATIVE
PH: 7 (ref 5.0–8.0)
Protein, ur: NEGATIVE mg/dL
SPECIFIC GRAVITY, URINE: 1.036 — AB (ref 1.005–1.030)

## 2017-03-05 LAB — WET PREP, GENITAL
SPERM: NONE SEEN
Trich, Wet Prep: NONE SEEN
Yeast Wet Prep HPF POC: NONE SEEN

## 2017-03-05 LAB — GLUCOSE, CAPILLARY: Glucose-Capillary: 312 mg/dL — ABNORMAL HIGH (ref 65–99)

## 2017-03-05 MED ORDER — METFORMIN HCL 500 MG PO TABS
500.0000 mg | ORAL_TABLET | Freq: Once | ORAL | Status: AC
Start: 2017-03-05 — End: 2017-03-05
  Administered 2017-03-05: 500 mg via ORAL
  Filled 2017-03-05: qty 1

## 2017-03-05 MED ORDER — INSULIN DETEMIR 100 UNIT/ML ~~LOC~~ SOLN
44.0000 [IU] | Freq: Once | SUBCUTANEOUS | Status: AC
  Administered 2017-03-05: 44 [IU] via SUBCUTANEOUS
  Filled 2017-03-05: qty 0.44

## 2017-03-05 MED ORDER — METRONIDAZOLE 500 MG PO TABS: 500.0000 mg | ORAL_TABLET | Freq: Two times a day (BID) | ORAL | 0 refills | Status: DC

## 2017-03-05 NOTE — MAU Note (Signed)
PT  SAYS  SHE WAS SITTING IN HER  CAR AT 725PM-   FELT  LEAKING  -  SHE  WIPED  WITH NAPKINS- WET  AND  PANTS   WET -   CLEAR - NO OD OR.      STILL FEELS  SOME LEAKING .    WENT  TO B-ROOM - SAW MILKY D/C.     BABY  IS   [facility].  [facility].   SAW SPOTTING  WHEN  SHE  WIPED.        STARTED VOMITING  Thursday  NIGHT -   CALLED   HERE - TOLD  TO COME IN - BUT  DID NOT .   VOMITED  TODAY      LAST SEX-   4-1      BLOOD SUGAR-   AT 4PM- 260

## 2017-03-05 NOTE — MAU Provider Note (Signed)
History     CSN: [id number]  Arrival date and time: 03/05/17 2145   First Provider Initiated Contact with Patient 03/05/17 2238      Chief Complaint  Patient presents with  . Vaginal Bleeding  . Vaginal Discharge  . Emesis During Pregnancy   HPI [name] is a 31 y.o. [id number] at [redacted]w[redacted]d who presents to MAU today with complaint of spotting and possible LOF. She had [place] yesterday and was told that she has polyhydramnios. She has IDDM and has not taken her medications tonight. She is on insulin and metformin and glyburide. She has a history of term IUFD with a previous pregnancy. She reports occasional mild contractions. She states good fetal movement.   OB History    Gravida Para Term Preterm AB Living   0 4 2   SAB TAB Ectopic Multiple Live Births   Past Medical History:  Diagnosis Date  . Diabetes mellitus without complication (HCC)    2 types of oral, 2 types of insulin- no longer taking.  was old A1C is normal  . Heart murmur    when young  . Infection    uti  . Neuropathy   . Vaginal Pap smear, abnormal    age 1, doesn't think she has had one since    Past Surgical History:  Procedure Laterality Date  . CESAREAN SECTION     C/S x 2  . THERAPEUTIC ABORTION    . WISDOM TOOTH EXTRACTION      Family History  Problem Relation Age of Onset  . Asthma Mother   . Drug abuse Mother     died of overdose  . HIV/AIDS Mother     72  . Asthma Daughter   . Diabetes Maternal Grandmother   . Cancer Maternal Grandmother     breast    Social History  Substance Use Topics  . Smoking status: Current Every Day Smoker    Packs/day: 0.25    Years: 10.00    Types: Cigarettes, E-cigarettes  . Smokeless tobacco: Never Used     Comment: trying to stop  . Alcohol use No     Comment: 05/05/2013    Allergies:  Allergies  Allergen Reactions  . Shellfish Allergy Itching    No prescriptions prior to admission.    Review of Systems   Constitutional: Negative for fever.  Gastrointestinal: Positive for abdominal pain. Negative for constipation, diarrhea, nausea and vomiting.  Genitourinary: Positive for vaginal bleeding and vaginal discharge. Negative for dysuria, frequency and urgency.   Physical Exam   Blood pressure 118/68, pulse 90, temperature 98.1 F (36.7 C), resp. rate 18, height  (1.499 m), weight 236 lb (107 kg), last menstrual period 08/14/2016, unknown if currently breastfeeding.  Physical Exam  Nursing note and vitals reviewed. Constitutional: She is oriented to person, place, and time. She appears well-developed and well-nourished. No distress.  HENT:  Head: Normocephalic and atraumatic.  Cardiovascular: Normal rate.   Respiratory: Effort normal.  GI: Soft. She exhibits no distension and no mass. There is no tenderness. There is no rebound and no guarding.  Genitourinary: Uterus is enlarged. Uterus is not tender. Cervix exhibits no motion tenderness, no discharge and no friability. No bleeding in the vagina. Vaginal discharge (small amount of thick, white discharge) found.  Neurological: She is alert and oriented to person, place, and time.  Skin: Skin is warm and  dry. No erythema.  Psychiatric: She has a normal mood and affect.  Dilation: Closed Effacement (%): Thick Cervical Position: Posterior Exam by:: Vonzella Nipple, PA-C   Results for orders placed or performed during the hospital encounter of 03/05/17 (from the past 24 hour(s))  Urinalysis, Routine w reflex microscopic     Status: Abnormal   Collection Time: 03/05/17 10:00 PM  Result Value Ref Range   Color, Urine YELLOW YELLOW   APPearance CLEAR CLEAR   Specific Gravity, Urine 1.036 (H) 1.005 - 1.030   pH 7.0 5.0 - 8.0   Glucose, UA >=500 (A) NEGATIVE mg/dL   Hgb urine dipstick NEGATIVE NEGATIVE   Bilirubin Urine NEGATIVE NEGATIVE   Ketones, ur NEGATIVE NEGATIVE mg/dL   Protein, ur NEGATIVE NEGATIVE mg/dL   Nitrite NEGATIVE  NEGATIVE   Leukocytes, UA NEGATIVE NEGATIVE   RBC / HPF 0-5 0 - 5 RBC/hpf   WBC, UA 0-5 0 - 5 WBC/hpf   Bacteria, UA NONE SEEN NONE SEEN   Squamous Epithelial / LPF 0-5 (A) NONE SEEN  Glucose, capillary     Status: Abnormal   Collection Time: 03/05/17 10:31 PM  Result Value Ref Range   Glucose-Capillary 312 (H) 65 - 99 mg/dL  Wet prep, genital     Status: Abnormal   Collection Time: 03/05/17 10:45 PM  Result Value Ref Range   Yeast Wet Prep HPF POC NONE SEEN NONE SEEN   Trich, Wet Prep NONE SEEN NONE SEEN   Clue Cells Wet Prep HPF POC PRESENT (A) NONE SEEN   WBC, Wet Prep HPF POC FEW (A) NONE SEEN   Sperm NONE SEEN     MAU Course  Procedures None  MDM CBG, UA and wet prep today Discussed with Dr. Despina Hidden. Since cervix is closed and contractions are irregular, patient does not need tocolysis. Treat wet prep and have patient follow-up with OB. Ok to give evening dose of medications that patient has missed while in MAU.   Assessment and Plan  A: SIUP at [redacted]w[redacted]d IDDM Bacterial Vaginosis  P: Discharge home Rx for Flagyl given to patient  Preterm labor precautions discussed Patient advised to follow-up with [facility] Patient may return to MAU as needed or if her condition were to change or worsen   Marny Lowenstein, PA-C  03/06/2017, 4:08 AM

## 2017-03-05 NOTE — Discharge Instructions (Signed)
Fetal Movement Counts Patient Name: ________________________________________________ Patient Due Date: ____________________ What is a fetal movement count? A fetal movement count is the number of times that you feel your baby move during a certain amount of time. This may also be called a fetal kick count. A fetal movement count is recommended for every pregnant woman. You may be asked to start counting fetal movements as early as week 28 of your pregnancy. Pay attention to when your baby is most active. You may notice your baby's sleep and wake cycles. You may also notice things that make your baby move more. You should do a fetal movement count:  When your baby is normally most active.  At the same time each day. A good time to count movements is while you are resting, after having something to eat and drink. How do I count fetal movements? 1. Find a quiet, comfortable area. Sit, or lie down on your side. 2. Write down the date, the start time and stop time, and the number of movements that you felt between those two times. Take this information with you to your health care visits. 3. For 2 hours, count kicks, flutters, swishes, rolls, and jabs. You should feel at least 10 movements during 2 hours. 4. You may stop counting after you have felt 10 movements. 5. If you do not feel 10 movements in 2 hours, have something to eat and drink. Then, keep resting and counting for 1 hour. If you feel at least 4 movements during that hour, you may stop counting. Contact a health care provider if:  You feel fewer than 4 movements in 2 hours.  Your baby is not moving like he or she usually does. Date: ____________ Start time: ____________ Stop time: ____________ Movements: ____________ Date: ____________ Start time: ____________ Stop time: ____________ Movements: ____________ Date: ____________ Start time: ____________ Stop time: ____________ Movements: ____________ Date: ____________ Start time:  ____________ Stop time: ____________ Movements: ____________ Date: ____________ Start time: ____________ Stop time: ____________ Movements: ____________ Date: ____________ Start time: ____________ Stop time: ____________ Movements: ____________ Date: ____________ Start time: ____________ Stop time: ____________ Movements: ____________ Date: ____________ Start time: ____________ Stop time: ____________ Movements: ____________ Date: ____________ Start time: ____________ Stop time: ____________ Movements: ____________ This information is not intended to replace advice given to you by your health care provider. Make sure you discuss any questions you have with your health care provider. Document Released: 11/25/2006 Document Revised: 06/24/2016 Document Reviewed: 12/05/2015 Elsevier Interactive Patient Education  2017 [employer] Braxton Hicks Contractions Contractions of the uterus can occur throughout pregnancy, but they are not always a sign that you are in labor. You may have practice contractions called Braxton Hicks contractions. These false labor contractions are sometimes confused with true labor. What are Braxton Hicks contractions? Braxton Hicks contractions are tightening movements that occur in the muscles of the uterus before labor. Unlike true labor contractions, these contractions do not result in opening (dilation) and thinning of the cervix. Toward the end of pregnancy (32-34 weeks), Braxton Hicks contractions can happen more often and may become stronger. These contractions are sometimes difficult to tell apart from true labor because they can be very uncomfortable. You should not feel embarrassed if you go to the hospital with false labor. Sometimes, the only way to tell if you are in true labor is for your health care provider to look for changes in the cervix. The health care provider will do a physical exam and may monitor your contractions. If you   are not in true labor, the exam  should show that your cervix is not dilating and your water has not broken. If there are no prenatal problems or other health problems associated with your pregnancy, it is completely safe for you to be sent home with false labor. You may continue to have Braxton Hicks contractions until you go into true labor. How can I tell the difference between true labor and false labor?  Differences  False labor  Contractions last 30-70 seconds.: Contractions are usually shorter and not as strong as true labor contractions.  Contractions become very regular.: Contractions are usually irregular.  Discomfort is usually felt in the top of the uterus, and it spreads to the lower abdomen and low back.: Contractions are often felt in the front of the lower abdomen and in the groin.  Contractions do not go away with walking.: Contractions may go away when you walk around or change positions while lying down.  Contractions usually become more intense and increase in frequency.: Contractions get weaker and are shorter-lasting as time goes on.  The cervix dilates and gets thinner.: The cervix usually does not dilate or become thin. Follow these instructions at home:  Take over-the-counter and prescription medicines only as told by your health care provider.  Keep up with your usual exercises and follow other instructions from your health care provider.  Eat and drink lightly if you think you are going into labor.  If Braxton Hicks contractions are making you uncomfortable:  Change your position from lying down or resting to walking, or change from walking to resting.  Sit and rest in a tub of warm water.  Drink enough fluid to keep your urine clear or pale yellow. Dehydration may cause these contractions.  Do slow and deep breathing several times an hour.  Keep all follow-up prenatal visits as told by your health care provider. This is important. Contact a health care provider if:  You have a  fever.  You have continuous pain in your abdomen. Get help right away if:  Your contractions become stronger, more regular, and closer together.  You have fluid leaking or gushing from your vagina.  You pass blood-tinged mucus (bloody show).  You have bleeding from your vagina.  You have low back pain that you never had before.  You feel your baby's head pushing down and causing pelvic pressure.  Your baby is not moving inside you as much as it used to. Summary  Contractions that occur before labor are called Braxton Hicks contractions, false labor, or practice contractions.  Braxton Hicks contractions are usually shorter, weaker, farther apart, and less regular than true labor contractions. True labor contractions usually become progressively stronger and regular and they become more frequent.  Manage discomfort from Braxton Hicks contractions by changing position, resting in a warm bath, drinking plenty of water, or practicing deep breathing. This information is not intended to replace advice given to you by your health care provider. Make sure you discuss any questions you have with your health care provider. Document Released: 10/26/2005 Document Revised: 09/14/2016 Document Reviewed: 09/14/2016 Elsevier Interactive Patient Education  2017 [employer] Inc[employer]  

## 2017-03-05 NOTE — MAU Note (Signed)
Diabetic and sugars not well controlled. Vomitng last night and some today. Had u/s yest and told had extra fld. Had some spotting earlier tonight. Have leaked some fld at various times tonight. Some white d/c too. Having contractions. Good FM. Blood sugar 363 this am. Taking insulin 44u hs Levamir , metformin  BID, glyburide  BID. Sugar 263 at 1500

## 2017-03-05 NOTE — Telephone Encounter (Signed)
Patient called front desk and was transferred to me. She vomited last night and this morning. Has been unable to hold anything down. No diarrhea or fever. Feeling bad this morning, states she did take her diabetes pill and her insulin. I advised patient that if she is sick and not able to hold down food she should not take any diabetes meds until she is told otherwise, she could bottom out her sugars. Patient states she feels like her sugar is low, she is shakey. Advised that she should drink some juice and try to eat something. Then she should go to MAU for eval. Patient stated she would do this.

## 2017-03-06 DIAGNOSIS — O219 Vomiting of pregnancy, unspecified: Secondary | ICD-10-CM | POA: Diagnosis not present

## 2017-03-12 ENCOUNTER — Ambulatory Visit (HOSPITAL_COMMUNITY)
Admission: RE | Admit: 2017-03-12 | Discharge: 2017-03-12 | Disposition: A | Discharge status: Home / Self Care | Payer: Medicare Other | Source: Ambulatory Visit | Attending: Obstetrics & Gynecology | Admitting: Obstetrics & Gynecology

## 2017-03-12 ENCOUNTER — Ambulatory Visit (INDEPENDENT_AMBULATORY_CARE_PROVIDER_SITE_OTHER): Payer: Medicare Other | Admitting: Obstetrics and Gynecology

## 2017-03-12 ENCOUNTER — Encounter (HOSPITAL_COMMUNITY): Payer: Self-pay

## 2017-03-12 ENCOUNTER — Other Ambulatory Visit (HOSPITAL_COMMUNITY): Payer: Self-pay | Admitting: Maternal and Fetal Medicine

## 2017-03-12 VITALS — BP 115/59 | HR 98 | Wt 234.8 lb

## 2017-03-12 DIAGNOSIS — O26899 Other specified pregnancy related conditions, unspecified trimester: Secondary | ICD-10-CM

## 2017-03-12 DIAGNOSIS — O24313 Unspecified pre-existing diabetes mellitus in pregnancy, third trimester: Secondary | ICD-10-CM | POA: Diagnosis not present

## 2017-03-12 DIAGNOSIS — O403XX0 Polyhydramnios, third trimester, not applicable or unspecified: Secondary | ICD-10-CM

## 2017-03-12 DIAGNOSIS — O99333 Smoking (tobacco) complicating pregnancy, third trimester: Secondary | ICD-10-CM | POA: Diagnosis not present

## 2017-03-12 DIAGNOSIS — O09299 Supervision of pregnancy with other poor reproductive or obstetric history, unspecified trimester: Secondary | ICD-10-CM

## 2017-03-12 DIAGNOSIS — O34219 Maternal care for unspecified type scar from previous cesarean delivery: Secondary | ICD-10-CM | POA: Diagnosis not present

## 2017-03-12 DIAGNOSIS — Z6791 Unspecified blood type, Rh negative: Secondary | ICD-10-CM

## 2017-03-12 DIAGNOSIS — O365920 Maternal care for other known or suspected poor fetal growth, second trimester, not applicable or unspecified: Secondary | ICD-10-CM | POA: Diagnosis present

## 2017-03-12 DIAGNOSIS — O0993 Supervision of high risk pregnancy, unspecified, third trimester: Secondary | ICD-10-CM

## 2017-03-12 DIAGNOSIS — Z3A30 30 weeks gestation of pregnancy: Secondary | ICD-10-CM

## 2017-03-12 DIAGNOSIS — O24119 Pre-existing diabetes mellitus, type 2, in pregnancy, unspecified trimester: Secondary | ICD-10-CM

## 2017-03-12 DIAGNOSIS — O09899 Supervision of other high risk pregnancies, unspecified trimester: Secondary | ICD-10-CM

## 2017-03-12 NOTE — Progress Notes (Signed)
   PRENATAL VISIT NOTE  Subjective:  Kathryn Mayo is a 31 y.o. [id number] at 4536w0d being seen today for ongoing prenatal care.  She is currently monitored for the following issues for this high-risk pregnancy and has History of miscarriage; Supervision of high-risk pregnancy; Depression affecting pregnancy; PTSD (post-traumatic stress disorder); Obesity affecting pregnancy; Type 2 diabetes mellitus affecting pregnancy, antepartum; Poor dentition; Smoker; Rh negative, antepartum; Prior pregnancy with fetal demise; Allergy to insulin; and Polyhydramnios on her problem list.  Patient reports a lot of frustration in managing.  Contractions: Irregular. Vag. Bleeding: None.  Movement: Present. Denies leaking of fluid.   The following portions of the patient's history were reviewed and updated as appropriate: allergies, current medications, past family history, past medical history, past social history, past surgical history and problem list. Problem list updated.  Objective:   Vitals:   03/12/17 1048  BP: (!) 115/59  Pulse: 98  Weight: 234 lb 12.8 oz (106.5 kg)    Fetal Status: Fetal Heart Rate (bpm): 145 Fundal Height: 38 cm Movement: Present     General:  Alert, oriented and cooperative. Patient is in no acute distress.  Skin: Skin is warm and dry. No rash noted.   Cardiovascular: Normal heart rate noted  Respiratory: Normal respiratory effort, no problems with respiration noted  Abdomen: Soft, gravid, appropriate for gestational age. Pain/Pressure: Present     Pelvic:  Cervical exam deferred        Extremities: Normal range of motion.  Edema: Trace  Mental Status: Normal mood and affect. Normal behavior. Normal judgment and thought content.   Assessment and Plan:  Pregnancy: [id number] at 7936w0d  1. Prior pregnancy with fetal demise Continue weekly BPP  2. Polyhydramnios in third trimester complication, single or unspecified fetus Follow up ultrasound today  3. Rh negative,  antepartum s/p rhogam  4. Supervision of high risk pregnancy in third trimester Patient is doing well She desires to be induced prior to 39 weeks  5. Type 2 diabetes mellitus affecting pregnancy, antepartum Patient did not bring CBG log. She takes glyburide and metformin BID as well as 44 units qHS of levomir Patient reports lowest fasting of 170 and pp in the 300 range Discussed with patient that it would benefit her greatly to get back on insulin. She fears that it is not healthy for her pregnancy and is looking for natural alternatives. She often starves herself in the hopes of losing weight and improving her CBG.  A lot of education was provided. She plans on discussing restarting insulin with endocrinologist next week. Labs per endocrinologist ordered as patient is not able to return prior to her appointment  Preterm labor symptoms and general obstetric precautions including but not limited to vaginal bleeding, contractions, leaking of fluid and fetal movement were reviewed in detail with the patient. Please refer to After Visit Summary for other counseling recommendations.  Return for ROB.   Catalina AntiguaPeggy Shalissa Easterwood, MD

## 2017-03-15 LAB — BASIC METABOLIC PANEL
BUN / CREAT RATIO: 12 (ref 9–23)
BUN: 8 mg/dL (ref 6–20)
CO2: 18 mmol/L (ref 18–29)
CREATININE: 0.69 mg/dL (ref 0.57–1.00)
Calcium: 9.2 mg/dL (ref 8.7–10.2)
Chloride: 95 mmol/L — ABNORMAL LOW (ref 96–106)
GFR calc Af Amer: 132 mL/min/{1.73_m2} (ref >59)
GFR, EST NON AFRICAN AMERICAN: 115 mL/min/{1.73_m2} (ref >59)
GLUCOSE: 463 mg/dL — AB (ref 65–99)
Potassium: 4.4 mmol/L (ref 3.5–5.2)
SODIUM: 131 mmol/L — AB (ref 134–144)

## 2017-03-15 LAB — MICROALBUMIN / CREATININE URINE RATIO
CREATININE, UR: 22.7 mg/dL
Microalb/Creat Ratio: <13.2 mg/g creat (ref 0.0–30.0)
Microalbumin, Urine: <3 ug/mL

## 2017-03-15 LAB — HGB A1C W/O EAG: Hgb A1c MFr Bld: 9.4 % — ABNORMAL HIGH (ref 4.8–5.6)

## 2017-03-15 LAB — PLEASE NOTE

## 2017-03-19 ENCOUNTER — Ambulatory Visit (HOSPITAL_COMMUNITY)
Admission: RE | Admit: 2017-03-19 | Discharge: 2017-03-19 | Disposition: A | Discharge status: Home / Self Care | Payer: Medicare Other | Source: Ambulatory Visit | Attending: Advanced Practice Midwife | Admitting: Advanced Practice Midwife

## 2017-03-19 DIAGNOSIS — O409XX0 Polyhydramnios, unspecified trimester, not applicable or unspecified: Secondary | ICD-10-CM

## 2017-03-19 DIAGNOSIS — O403XX0 Polyhydramnios, third trimester, not applicable or unspecified: Secondary | ICD-10-CM | POA: Insufficient documentation

## 2017-03-19 DIAGNOSIS — Z3A31 31 weeks gestation of pregnancy: Secondary | ICD-10-CM | POA: Diagnosis not present

## 2017-03-24 ENCOUNTER — Ambulatory Visit (INDEPENDENT_AMBULATORY_CARE_PROVIDER_SITE_OTHER): Payer: Medicare Other | Admitting: Obstetrics & Gynecology

## 2017-03-24 VITALS — BP 122/78 | HR 90 | Wt 234.6 lb

## 2017-03-24 DIAGNOSIS — O0993 Supervision of high risk pregnancy, unspecified, third trimester: Secondary | ICD-10-CM

## 2017-03-24 DIAGNOSIS — O09293 Supervision of pregnancy with other poor reproductive or obstetric history, third trimester: Secondary | ICD-10-CM

## 2017-03-24 DIAGNOSIS — O09299 Supervision of pregnancy with other poor reproductive or obstetric history, unspecified trimester: Secondary | ICD-10-CM

## 2017-03-24 LAB — GLUCOSE, CAPILLARY: Glucose-Capillary: 241 mg/dL — ABNORMAL HIGH (ref 65–99)

## 2017-03-24 MED ORDER — ACCU-CHEK FASTCLIX LANCETS MISC: 1.0000 [IU] | Freq: Four times a day (QID) | 12 refills | Status: DC

## 2017-03-24 NOTE — Progress Notes (Signed)
   PRENATAL VISIT NOTE  Subjective:  Kathryn Mayo is a 31 y.o. [id number] at 9959w5d being seen today for ongoing prenatal care.  She is currently monitored for the following issues for this high-risk pregnancy and has History of miscarriage; Supervision of high-risk pregnancy; Depression affecting pregnancy; PTSD (post-traumatic stress disorder); Obesity affecting pregnancy; Type 2 diabetes mellitus affecting pregnancy, antepartum; Poor dentition; Smoker; Rh negative, antepartum; Prior pregnancy with fetal demise; Allergy to insulin; and Polyhydramnios on her problem list.  Patient reports occasional contractions.  Contractions: Irregular. Vag. Bleeding: None.  Movement: Present. Denies leaking of fluid.  Local skin reactions to insulin injections The following portions of the patient's history were reviewed and updated as appropriate: allergies, current medications, past family history, past medical history, past social history, past surgical history and problem list. Problem list updated.  Objective:   Vitals:   03/24/17 1350  BP: 122/78  Pulse: 90  Weight: 234 lb 9.6 oz (106.4 kg)    Fetal Status: Fetal Heart Rate (bpm): 144   Movement: Present     General:  Alert, oriented and cooperative. Patient is in no acute distress.  Skin: Skin is warm and dry. No rash noted.   Cardiovascular: Normal heart rate noted  Respiratory: Normal respiratory effort, no problems with respiration noted  Abdomen: Soft, gravid, appropriate for gestational age. Pain/Pressure: Present     Pelvic:  Cervical exam deferred        Extremities: Normal range of motion.  Edema: None  Mental Status: Normal mood and affect. Normal behavior. Normal judgment and thought content.   Assessment and Plan:  Pregnancy: [id number] at 6759w5d BG is 241, pt is not testing at home There are no diagnoses linked to this encounter. Preterm labor symptoms and general obstetric precautions including but not limited to vaginal bleeding,  contractions, leaking of fluid and fetal movement were reviewed in detail with the patient. Please refer to After Visit Summary for other counseling recommendations.  Return in 1 week (on 03/31/2017). She will try to comply with BG testing and insulin therapy, she will see her endocrinologist in 1-2 weeks. BPP in 2 days  Adam PhenixArnold, Jenifer Struve G, MD

## 2017-03-24 NOTE — Patient Instructions (Signed)
Third Trimester of Pregnancy The third trimester is from week 28 through week 40 (months 7 through 9). The third trimester is a time when the unborn baby (fetus) is growing rapidly. At the end of the ninth month, the fetus is about 20 inches in length and weighs 6-10 pounds. Body changes during your third trimester Your body will continue to go through many changes during pregnancy. The changes vary from woman to woman. During the third trimester:  Your weight will continue to increase. You can expect to gain 25-35 pounds (11-16 kg) by the end of the pregnancy.  You may begin to get stretch marks on your hips, abdomen, and breasts.  You may urinate more often because the fetus is moving lower into your pelvis and pressing on your bladder.  You may develop or continue to have heartburn. This is caused by increased hormones that slow down muscles in the digestive tract.  You may develop or continue to have constipation because increased hormones slow digestion and cause the muscles that push waste through your intestines to relax.  You may develop hemorrhoids. These are swollen veins (varicose veins) in the rectum that can itch or be painful.  You may develop swollen, bulging veins (varicose veins) in your legs.  You may have increased body aches in the pelvis, back, or thighs. This is due to weight gain and increased hormones that are relaxing your joints.  You may have changes in your hair. These can include thickening of your hair, rapid growth, and changes in texture. Some women also have hair loss during or after pregnancy, or hair that feels dry or thin. Your hair will most likely return to normal after your baby is born.  Your breasts will continue to grow and they will continue to become tender. A yellow fluid (colostrum) may leak from your breasts. This is the first milk you are producing for your baby.  Your belly button may stick out.  You may notice more swelling in your hands,  face, or ankles.  You may have increased tingling or numbness in your hands, arms, and legs. The skin on your belly may also feel numb.  You may feel short of breath because of your expanding uterus.  You may have more problems sleeping. This can be caused by the size of your belly, increased need to urinate, and an increase in your body's metabolism.  You may notice the fetus "dropping," or moving lower in your abdomen (lightening).  You may have increased vaginal discharge.  You may notice your joints feel loose and you may have pain around your pelvic bone.  What to expect at prenatal visits You will have prenatal exams every 2 weeks until week 36. Then you will have weekly prenatal exams. During a routine prenatal visit:  You will be weighed to make sure you and the baby are growing normally.  Your blood pressure will be taken.  Your abdomen will be measured to track your baby's growth.  The fetal heartbeat will be listened to.  Any test results from the previous visit will be discussed.  You may have a cervical check near your due date to see if your cervix has softened or thinned (effaced).  You will be tested for Group B streptococcus. This happens between 35 and 37 weeks.  Your health care provider may ask you:  What your birth plan is.  How you are feeling.  If you are feeling the baby move.  If you have had   any abnormal symptoms, such as leaking fluid, bleeding, severe headaches, or abdominal cramping.  If you are using any tobacco products, including cigarettes, chewing tobacco, and electronic cigarettes.  If you have any questions.  Other tests or screenings that may be performed during your third trimester include:  Blood tests that check for low iron levels (anemia).  Fetal testing to check the health, activity level, and growth of the fetus. Testing is done if you have certain medical conditions or if there are problems during the  pregnancy.  Nonstress test (NST). This test checks the health of your baby to make sure there are no signs of problems, such as the baby not getting enough oxygen. During this test, a belt is placed around your belly. The baby is made to move, and its heart rate is monitored during movement.  What is false labor? False labor is a condition in which you feel small, irregular tightenings of the muscles in the womb (contractions) that usually go away with rest, changing position, or drinking water. These are called Braxton Hicks contractions. Contractions may last for hours, days, or even weeks before true labor sets in. If contractions come at regular intervals, become more frequent, increase in intensity, or become painful, you should see your health care provider. What are the signs of labor?  Abdominal cramps.  Regular contractions that start at 10 minutes apart and become stronger and more frequent with time.  Contractions that start on the top of the uterus and spread down to the lower abdomen and back.  Increased pelvic pressure and dull back pain.  A watery or bloody mucus discharge that comes from the vagina.  Leaking of amniotic fluid. This is also known as your "water breaking." It could be a slow trickle or a gush. Let your health care provider know if it has a color or strange odor. If you have any of these signs, call your health care provider right away, even if it is before your due date. Follow these instructions at home: Medicines  Follow your health care provider's instructions regarding medicine use. Specific medicines may be either safe or unsafe to take during pregnancy.  Take a prenatal vitamin that contains at least 600 micrograms (mcg) of folic acid.  If you develop constipation, try taking a stool softener if your health care provider approves. Eating and drinking  Eat a balanced diet that includes fresh fruits and vegetables, whole grains, good sources of protein  such as meat, eggs, or tofu, and low-fat dairy. Your health care provider will help you determine the amount of weight gain that is right for you.  Avoid raw meat and uncooked cheese. These carry germs that can cause birth defects in the baby.  If you have low calcium intake from food, talk to your health care provider about whether you should take a daily calcium supplement.  Eat four or five small meals rather than three large meals a day.  Limit foods that are high in fat and processed sugars, such as fried and sweet foods.  To prevent constipation: ? Drink enough fluid to keep your urine clear or pale yellow. ? Eat foods that are high in fiber, such as fresh fruits and vegetables, whole grains, and beans. Activity  Exercise only as directed by your health care provider. Most women can continue their usual exercise routine during pregnancy. Try to exercise for 30 minutes at least 5 days a week. Stop exercising if you experience uterine contractions.  Avoid heavy   lifting.  Do not exercise in extreme heat or humidity, or at high altitudes.  Wear low-heel, comfortable shoes.  Practice good posture.  You may continue to have sex unless your health care provider tells you otherwise. Relieving pain and discomfort  Take frequent breaks and rest with your legs elevated if you have leg cramps or low back pain.  Take warm sitz baths to soothe any pain or discomfort caused by hemorrhoids. Use hemorrhoid cream if your health care provider approves.  Wear a good support bra to prevent discomfort from breast tenderness.  If you develop varicose veins: ? Wear support pantyhose or compression stockings as told by your healthcare provider. ? Elevate your feet for 15 minutes, 3-4 times a day. Prenatal care  Write down your questions. Take them to your prenatal visits.  Keep all your prenatal visits as told by your health care provider. This is important. Safety  Wear your seat belt at  all times when driving.  Make a list of emergency phone numbers, including numbers for family, friends, the hospital, and police and fire departments. General instructions  Avoid cat litter boxes and soil used by cats. These carry germs that can cause birth defects in the baby. If you have a cat, ask someone to clean the litter box for you.  Do not travel far distances unless it is absolutely necessary and only with the approval of your health care provider.  Do not use hot tubs, steam rooms, or saunas.  Do not drink alcohol.  Do not use any products that contain nicotine or tobacco, such as cigarettes and e-cigarettes. If you need help quitting, ask your health care provider.  Do not use any medicinal herbs or unprescribed drugs. These chemicals affect the formation and growth of the baby.  Do not douche or use tampons or scented sanitary pads.  Do not cross your legs for long periods of time.  To prepare for the arrival of your baby: ? Take prenatal classes to understand, practice, and ask questions about labor and delivery. ? Make a trial run to the hospital. ? Visit the hospital and tour the maternity area. ? Arrange for maternity or paternity leave through employers. ? Arrange for family and friends to take care of pets while you are in the hospital. ? Purchase a rear-facing car seat and make sure you know how to install it in your car. ? Pack your hospital bag. ? Prepare the baby's nursery. Make sure to remove all pillows and stuffed animals from the baby's crib to prevent suffocation.  Visit your dentist if you have not gone during your pregnancy. Use a soft toothbrush to brush your teeth and be gentle when you floss. Contact a health care provider if:  You are unsure if you are in labor or if your water has broken.  You become dizzy.  You have mild pelvic cramps, pelvic pressure, or nagging pain in your abdominal area.  You have lower back pain.  You have persistent  nausea, vomiting, or diarrhea.  You have an unusual or bad smelling vaginal discharge.  You have pain when you urinate. Get help right away if:  Your water breaks before 37 weeks.  You have regular contractions less than 5 minutes apart before 37 weeks.  You have a fever.  You are leaking fluid from your vagina.  You have spotting or bleeding from your vagina.  You have severe abdominal pain or cramping.  You have rapid weight loss or weight gain.    You have shortness of breath with chest pain.  You notice sudden or extreme swelling of your face, hands, ankles, feet, or legs.  Your baby makes fewer than 10 movements in 2 hours.  You have severe headaches that do not go away when you take medicine.  You have vision changes. Summary  The third trimester is from week 28 through week 40, months 7 through 9. The third trimester is a time when the unborn baby (fetus) is growing rapidly.  During the third trimester, your discomfort may increase as you and your baby continue to gain weight. You may have abdominal, leg, and back pain, sleeping problems, and an increased need to urinate.  During the third trimester your breasts will keep growing and they will continue to become tender. A yellow fluid (colostrum) may leak from your breasts. This is the first milk you are producing for your baby.  False labor is a condition in which you feel small, irregular tightenings of the muscles in the womb (contractions) that eventually go away. These are called Braxton Hicks contractions. Contractions may last for hours, days, or even weeks before true labor sets in.  Signs of labor can include: abdominal cramps; regular contractions that start at 10 minutes apart and become stronger and more frequent with time; watery or bloody mucus discharge that comes from the vagina; increased pelvic pressure and dull back pain; and leaking of amniotic fluid. This information is not intended to replace advice  given to you by your health care provider. Make sure you discuss any questions you have with your health care provider. Document Released: 10/20/2001 Document Revised: 04/02/2016 Document Reviewed: 12/27/2012 Elsevier Interactive Patient Education  2017 [employer]  

## 2017-03-26 ENCOUNTER — Ambulatory Visit (HOSPITAL_COMMUNITY)
Admission: RE | Admit: 2017-03-26 | Discharge: 2017-03-26 | Disposition: A | Discharge status: Home / Self Care | Payer: Medicare Other | Source: Ambulatory Visit | Attending: Obstetrics & Gynecology | Admitting: Obstetrics & Gynecology

## 2017-03-26 ENCOUNTER — Ambulatory Visit (HOSPITAL_COMMUNITY): Payer: Medicare Other

## 2017-03-26 DIAGNOSIS — O409XX0 Polyhydramnios, unspecified trimester, not applicable or unspecified: Secondary | ICD-10-CM | POA: Diagnosis present

## 2017-03-26 DIAGNOSIS — O24913 Unspecified diabetes mellitus in pregnancy, third trimester: Secondary | ICD-10-CM | POA: Insufficient documentation

## 2017-03-26 DIAGNOSIS — O403XX0 Polyhydramnios, third trimester, not applicable or unspecified: Secondary | ICD-10-CM

## 2017-03-26 DIAGNOSIS — Z3A32 32 weeks gestation of pregnancy: Secondary | ICD-10-CM | POA: Diagnosis not present

## 2017-03-26 NOTE — Procedures (Signed)
Kathryn Mayo 09/03/1986 574w0d  Fetus A Non-Stress Test Interpretation for 03/26/17  Indication: Unsatisfactory BPP  Fetal Heart Rate A Mode: External Baseline Rate (A): 135 bpm Variability: Moderate Accelerations: 10 x 10, 15 x 15 Decelerations: None Multiple birth?: No  Uterine Activity Mode: Palpation, Toco Contraction Frequency (min): 2-12 Contraction Duration (sec): 50-70 Contraction Quality: Mild Resting Tone Palpated: Relaxed Resting Time: Adequate  Interpretation (Fetal Testing) Nonstress Test Interpretation: Reactive Overall Impression: Reassuring for gestational age Comments: Reviewed tracing with Dr. Clarisa FlingBrost

## 2017-03-31 ENCOUNTER — Encounter: Payer: Self-pay | Admitting: Obstetrics and Gynecology

## 2017-03-31 ENCOUNTER — Telehealth: Payer: Self-pay | Admitting: Obstetrics and Gynecology

## 2017-03-31 NOTE — Telephone Encounter (Signed)
Called patient due to missed ob appointment and to call office.

## 2017-04-02 ENCOUNTER — Encounter (HOSPITAL_COMMUNITY): Payer: Self-pay

## 2017-04-02 ENCOUNTER — Other Ambulatory Visit: Payer: Self-pay | Admitting: Obstetrics & Gynecology

## 2017-04-02 ENCOUNTER — Ambulatory Visit (HOSPITAL_COMMUNITY)
Admission: RE | Admit: 2017-04-02 | Discharge: 2017-04-02 | Disposition: A | Discharge status: Home / Self Care | Payer: Medicare Other | Source: Ambulatory Visit | Attending: Obstetrics & Gynecology | Admitting: Obstetrics & Gynecology

## 2017-04-02 ENCOUNTER — Ambulatory Visit (INDEPENDENT_AMBULATORY_CARE_PROVIDER_SITE_OTHER): Payer: Medicare Other | Admitting: Obstetrics & Gynecology

## 2017-04-02 ENCOUNTER — Encounter: Payer: Self-pay | Admitting: Obstetrics & Gynecology

## 2017-04-02 VITALS — BP 130/79 | HR 97 | Wt 234.2 lb

## 2017-04-02 DIAGNOSIS — O24119 Pre-existing diabetes mellitus, type 2, in pregnancy, unspecified trimester: Secondary | ICD-10-CM

## 2017-04-02 DIAGNOSIS — O409XX3 Polyhydramnios, unspecified trimester, fetus 3: Secondary | ICD-10-CM | POA: Insufficient documentation

## 2017-04-02 DIAGNOSIS — O409XX0 Polyhydramnios, unspecified trimester, not applicable or unspecified: Secondary | ICD-10-CM | POA: Diagnosis present

## 2017-04-02 DIAGNOSIS — O09299 Supervision of pregnancy with other poor reproductive or obstetric history, unspecified trimester: Secondary | ICD-10-CM

## 2017-04-02 DIAGNOSIS — Z3A33 33 weeks gestation of pregnancy: Secondary | ICD-10-CM

## 2017-04-02 DIAGNOSIS — O403XX0 Polyhydramnios, third trimester, not applicable or unspecified: Secondary | ICD-10-CM

## 2017-04-02 DIAGNOSIS — Z6791 Unspecified blood type, Rh negative: Secondary | ICD-10-CM

## 2017-04-02 DIAGNOSIS — O0993 Supervision of high risk pregnancy, unspecified, third trimester: Secondary | ICD-10-CM

## 2017-04-02 DIAGNOSIS — O24113 Pre-existing diabetes mellitus, type 2, in pregnancy, third trimester: Secondary | ICD-10-CM

## 2017-04-02 DIAGNOSIS — O26899 Other specified pregnancy related conditions, unspecified trimester: Secondary | ICD-10-CM

## 2017-04-02 MED ORDER — GLYBURIDE 5 MG PO TABS: ORAL_TABLET | ORAL | 1 refills | Status: DC

## 2017-04-02 NOTE — Progress Notes (Signed)
   PRENATAL VISIT NOTE  Subjective:  Kathryn Mayo is a 31 y.o. [id number] at 5227w0d being seen today for ongoing prenatal care.  She is currently monitored for the following issues for this high-risk pregnancy and has History of miscarriage; Supervision of high-risk pregnancy; Depression affecting pregnancy; PTSD (post-traumatic stress disorder); Obesity affecting pregnancy; Type 2 diabetes mellitus affecting pregnancy, antepartum; Poor dentition; Smoker; Rh negative, antepartum; Prior pregnancy with fetal demise; Allergy to insulin; and Polyhydramnios on her problem list.  Patient reports states FBS 150 and PP are similar, not taking any insulin. F/U with endocrine is scheduled.  Contractions: Irregular. Vag. Bleeding: None.  Movement: Present. Denies leaking of fluid.   The following portions of the patient's history were reviewed and updated as appropriate: allergies, current medications, past family history, past medical history, past social history, past surgical history and problem list. Problem list updated.  Objective:   Vitals:   04/02/17 1036  BP: 130/79  Pulse: 97  Weight: 234 lb 3.2 oz (106.2 kg)    Fetal Status: Fetal Heart Rate (bpm): 143   Movement: Present     General:  Alert, oriented and cooperative. Patient is in no acute distress.  Skin: Skin is warm and dry. No rash noted.   Cardiovascular: Normal heart rate noted  Respiratory: Normal respiratory effort, no problems with respiration noted  Abdomen: Soft, gravid, appropriate for gestational age. Pain/Pressure: Present     Pelvic:  Cervical exam deferred        Extremities: Normal range of motion.  Edema: None  Mental Status: Normal mood and affect. Normal behavior. Normal judgment and thought content.   Assessment and Plan:  Pregnancy: [id number] at 1027w0d  1. Supervision of high risk pregnancy in third trimester Intolerance to insulin, poor control  2. Type 2 diabetes mellitus affecting pregnancy,  antepartum Increase pm glyburide - glyBURIDE (DIABETA) 5 MG tablet; One tablet by mouth  In the morning and 2 at night  Dispense: 90 tablet; Refill: 1 - US MFM FETAL BPP WO NON STRESS; Future  3. Polyhydramnios in third trimester complication, single or unspecified fetus F/u BPP was requested by Dr Clarisa FlingBrost  Preterm labor symptoms and general obstetric precautions including but not limited to vaginal bleeding, contractions, leaking of fluid and fetal movement were reviewed in detail with the patient. Please refer to After Visit Summary for other counseling recommendations.  1 week  Scheryl DarterJames Ariv Penrod, MD

## 2017-04-02 NOTE — Patient Instructions (Signed)
Third Trimester of Pregnancy The third trimester is from week 28 through week 40 (months 7 through 9). The third trimester is a time when the unborn baby (fetus) is growing rapidly. At the end of the ninth month, the fetus is about 20 inches in length and weighs 6-10 pounds. Body changes during your third trimester Your body will continue to go through many changes during pregnancy. The changes vary from woman to woman. During the third trimester:  Your weight will continue to increase. You can expect to gain 25-35 pounds (11-16 kg) by the end of the pregnancy.  You may begin to get stretch marks on your hips, abdomen, and breasts.  You may urinate more often because the fetus is moving lower into your pelvis and pressing on your bladder.  You may develop or continue to have heartburn. This is caused by increased hormones that slow down muscles in the digestive tract.  You may develop or continue to have constipation because increased hormones slow digestion and cause the muscles that push waste through your intestines to relax.  You may develop hemorrhoids. These are swollen veins (varicose veins) in the rectum that can itch or be painful.  You may develop swollen, bulging veins (varicose veins) in your legs.  You may have increased body aches in the pelvis, back, or thighs. This is due to weight gain and increased hormones that are relaxing your joints.  You may have changes in your hair. These can include thickening of your hair, rapid growth, and changes in texture. Some women also have hair loss during or after pregnancy, or hair that feels dry or thin. Your hair will most likely return to normal after your baby is born.  Your breasts will continue to grow and they will continue to become tender. A yellow fluid (colostrum) may leak from your breasts. This is the first milk you are producing for your baby.  Your belly button may stick out.  You may notice more swelling in your hands,  face, or ankles.  You may have increased tingling or numbness in your hands, arms, and legs. The skin on your belly may also feel numb.  You may feel short of breath because of your expanding uterus.  You may have more problems sleeping. This can be caused by the size of your belly, increased need to urinate, and an increase in your body's metabolism.  You may notice the fetus "dropping," or moving lower in your abdomen (lightening).  You may have increased vaginal discharge.  You may notice your joints feel loose and you may have pain around your pelvic bone.  What to expect at prenatal visits You will have prenatal exams every 2 weeks until week 36. Then you will have weekly prenatal exams. During a routine prenatal visit:  You will be weighed to make sure you and the baby are growing normally.  Your blood pressure will be taken.  Your abdomen will be measured to track your baby's growth.  The fetal heartbeat will be listened to.  Any test results from the previous visit will be discussed.  You may have a cervical check near your due date to see if your cervix has softened or thinned (effaced).  You will be tested for Group B streptococcus. This happens between 35 and 37 weeks.  Your health care provider may ask you:  What your birth plan is.  How you are feeling.  If you are feeling the baby move.  If you have had   any abnormal symptoms, such as leaking fluid, bleeding, severe headaches, or abdominal cramping.  If you are using any tobacco products, including cigarettes, chewing tobacco, and electronic cigarettes.  If you have any questions.  Other tests or screenings that may be performed during your third trimester include:  Blood tests that check for low iron levels (anemia).  Fetal testing to check the health, activity level, and growth of the fetus. Testing is done if you have certain medical conditions or if there are problems during the  pregnancy.  Nonstress test (NST). This test checks the health of your baby to make sure there are no signs of problems, such as the baby not getting enough oxygen. During this test, a belt is placed around your belly. The baby is made to move, and its heart rate is monitored during movement.  What is false labor? False labor is a condition in which you feel small, irregular tightenings of the muscles in the womb (contractions) that usually go away with rest, changing position, or drinking water. These are called Braxton Hicks contractions. Contractions may last for hours, days, or even weeks before true labor sets in. If contractions come at regular intervals, become more frequent, increase in intensity, or become painful, you should see your health care provider. What are the signs of labor?  Abdominal cramps.  Regular contractions that start at 10 minutes apart and become stronger and more frequent with time.  Contractions that start on the top of the uterus and spread down to the lower abdomen and back.  Increased pelvic pressure and dull back pain.  A watery or bloody mucus discharge that comes from the vagina.  Leaking of amniotic fluid. This is also known as your "water breaking." It could be a slow trickle or a gush. Let your health care provider know if it has a color or strange odor. If you have any of these signs, call your health care provider right away, even if it is before your due date. Follow these instructions at home: Medicines  Follow your health care provider's instructions regarding medicine use. Specific medicines may be either safe or unsafe to take during pregnancy.  Take a prenatal vitamin that contains at least 600 micrograms (mcg) of folic acid.  If you develop constipation, try taking a stool softener if your health care provider approves. Eating and drinking  Eat a balanced diet that includes fresh fruits and vegetables, whole grains, good sources of protein  such as meat, eggs, or tofu, and low-fat dairy. Your health care provider will help you determine the amount of weight gain that is right for you.  Avoid raw meat and uncooked cheese. These carry germs that can cause birth defects in the baby.  If you have low calcium intake from food, talk to your health care provider about whether you should take a daily calcium supplement.  Eat four or five small meals rather than three large meals a day.  Limit foods that are high in fat and processed sugars, such as fried and sweet foods.  To prevent constipation: ? Drink enough fluid to keep your urine clear or pale yellow. ? Eat foods that are high in fiber, such as fresh fruits and vegetables, whole grains, and beans. Activity  Exercise only as directed by your health care provider. Most women can continue their usual exercise routine during pregnancy. Try to exercise for 30 minutes at least 5 days a week. Stop exercising if you experience uterine contractions.  Avoid heavy   lifting.  Do not exercise in extreme heat or humidity, or at high altitudes.  Wear low-heel, comfortable shoes.  Practice good posture.  You may continue to have sex unless your health care provider tells you otherwise. Relieving pain and discomfort  Take frequent breaks and rest with your legs elevated if you have leg cramps or low back pain.  Take warm sitz baths to soothe any pain or discomfort caused by hemorrhoids. Use hemorrhoid cream if your health care provider approves.  Wear a good support bra to prevent discomfort from breast tenderness.  If you develop varicose veins: ? Wear support pantyhose or compression stockings as told by your healthcare provider. ? Elevate your feet for 15 minutes, 3-4 times a day. Prenatal care  Write down your questions. Take them to your prenatal visits.  Keep all your prenatal visits as told by your health care provider. This is important. Safety  Wear your seat belt at  all times when driving.  Make a list of emergency phone numbers, including numbers for family, friends, the hospital, and police and fire departments. General instructions  Avoid cat litter boxes and soil used by cats. These carry germs that can cause birth defects in the baby. If you have a cat, ask someone to clean the litter box for you.  Do not travel far distances unless it is absolutely necessary and only with the approval of your health care provider.  Do not use hot tubs, steam rooms, or saunas.  Do not drink alcohol.  Do not use any products that contain nicotine or tobacco, such as cigarettes and e-cigarettes. If you need help quitting, ask your health care provider.  Do not use any medicinal herbs or unprescribed drugs. These chemicals affect the formation and growth of the baby.  Do not douche or use tampons or scented sanitary pads.  Do not cross your legs for long periods of time.  To prepare for the arrival of your baby: ? Take prenatal classes to understand, practice, and ask questions about labor and delivery. ? Make a trial run to the hospital. ? Visit the hospital and tour the maternity area. ? Arrange for maternity or paternity leave through employers. ? Arrange for family and friends to take care of pets while you are in the hospital. ? Purchase a rear-facing car seat and make sure you know how to install it in your car. ? Pack your hospital bag. ? Prepare the baby's nursery. Make sure to remove all pillows and stuffed animals from the baby's crib to prevent suffocation.  Visit your dentist if you have not gone during your pregnancy. Use a soft toothbrush to brush your teeth and be gentle when you floss. Contact a health care provider if:  You are unsure if you are in labor or if your water has broken.  You become dizzy.  You have mild pelvic cramps, pelvic pressure, or nagging pain in your abdominal area.  You have lower back pain.  You have persistent  nausea, vomiting, or diarrhea.  You have an unusual or bad smelling vaginal discharge.  You have pain when you urinate. Get help right away if:  Your water breaks before 37 weeks.  You have regular contractions less than 5 minutes apart before 37 weeks.  You have a fever.  You are leaking fluid from your vagina.  You have spotting or bleeding from your vagina.  You have severe abdominal pain or cramping.  You have rapid weight loss or weight gain.    You have shortness of breath with chest pain.  You notice sudden or extreme swelling of your face, hands, ankles, feet, or legs.  Your baby makes fewer than 10 movements in 2 hours.  You have severe headaches that do not go away when you take medicine.  You have vision changes. Summary  The third trimester is from week 28 through week 40, months 7 through 9. The third trimester is a time when the unborn baby (fetus) is growing rapidly.  During the third trimester, your discomfort may increase as you and your baby continue to gain weight. You may have abdominal, leg, and back pain, sleeping problems, and an increased need to urinate.  During the third trimester your breasts will keep growing and they will continue to become tender. A yellow fluid (colostrum) may leak from your breasts. This is the first milk you are producing for your baby.  False labor is a condition in which you feel small, irregular tightenings of the muscles in the womb (contractions) that eventually go away. These are called Braxton Hicks contractions. Contractions may last for hours, days, or even weeks before true labor sets in.  Signs of labor can include: abdominal cramps; regular contractions that start at 10 minutes apart and become stronger and more frequent with time; watery or bloody mucus discharge that comes from the vagina; increased pelvic pressure and dull back pain; and leaking of amniotic fluid. This information is not intended to replace advice  given to you by your health care provider. Make sure you discuss any questions you have with your health care provider. Document Released: 10/20/2001 Document Revised: 04/02/2016 Document Reviewed: 12/27/2012 Elsevier Interactive Patient Education  2017 [employer]  

## 2017-04-02 NOTE — Procedures (Signed)
Kathryn Mayo 02/06/1986 [id number]  Fetus A Non-Stress Test Interpretation for 04/02/17  Indication: Unsatisfactory BPP  Fetal Heart Rate A Mode: External Baseline Rate (A): 135 bpm Variability: Moderate Accelerations: 15 x 15, 10 x 10 Decelerations: Variable (x1) Multiple birth?: No  Uterine Activity Mode: Palpation, Toco Contraction Frequency (min): x1 Contraction Duration (sec): 50 Contraction Quality: Mild Resting Tone Palpated: Relaxed Resting Time: Adequate  Interpretation (Fetal Testing) Nonstress Test Interpretation: Reactive Overall Impression: Reassuring for gestational age Comments: Reviewed tracing with Dr. Ezzard StandingNewman

## 2017-04-06 ENCOUNTER — Encounter: Payer: Self-pay | Admitting: General Practice

## 2017-04-06 ENCOUNTER — Telehealth: Payer: Self-pay | Admitting: General Practice

## 2017-04-09 ENCOUNTER — Ambulatory Visit (HOSPITAL_COMMUNITY)
Admission: RE | Admit: 2017-04-09 | Discharge: 2017-04-09 | Disposition: A | Discharge status: Home / Self Care | Payer: Medicare Other | Source: Ambulatory Visit | Attending: Obstetrics & Gynecology | Admitting: Obstetrics & Gynecology

## 2017-04-09 ENCOUNTER — Other Ambulatory Visit (HOSPITAL_COMMUNITY): Payer: Self-pay | Admitting: Maternal and Fetal Medicine

## 2017-04-09 ENCOUNTER — Other Ambulatory Visit (HOSPITAL_COMMUNITY): Payer: Self-pay | Admitting: *Deleted

## 2017-04-09 ENCOUNTER — Encounter (HOSPITAL_COMMUNITY): Payer: Self-pay

## 2017-04-09 DIAGNOSIS — Z6791 Unspecified blood type, Rh negative: Secondary | ICD-10-CM

## 2017-04-09 DIAGNOSIS — O403XX0 Polyhydramnios, third trimester, not applicable or unspecified: Secondary | ICD-10-CM

## 2017-04-09 DIAGNOSIS — O09299 Supervision of pregnancy with other poor reproductive or obstetric history, unspecified trimester: Secondary | ICD-10-CM

## 2017-04-09 DIAGNOSIS — O34219 Maternal care for unspecified type scar from previous cesarean delivery: Secondary | ICD-10-CM

## 2017-04-09 DIAGNOSIS — O365920 Maternal care for other known or suspected poor fetal growth, second trimester, not applicable or unspecified: Secondary | ICD-10-CM | POA: Diagnosis present

## 2017-04-09 DIAGNOSIS — O24313 Unspecified pre-existing diabetes mellitus in pregnancy, third trimester: Secondary | ICD-10-CM

## 2017-04-09 DIAGNOSIS — O26899 Other specified pregnancy related conditions, unspecified trimester: Secondary | ICD-10-CM

## 2017-04-09 DIAGNOSIS — O409XX0 Polyhydramnios, unspecified trimester, not applicable or unspecified: Secondary | ICD-10-CM

## 2017-04-09 DIAGNOSIS — Z3A34 34 weeks gestation of pregnancy: Secondary | ICD-10-CM | POA: Diagnosis not present

## 2017-04-09 DIAGNOSIS — O0993 Supervision of high risk pregnancy, unspecified, third trimester: Secondary | ICD-10-CM

## 2017-04-09 DIAGNOSIS — O99333 Smoking (tobacco) complicating pregnancy, third trimester: Secondary | ICD-10-CM | POA: Diagnosis not present

## 2017-04-10 ENCOUNTER — Inpatient Hospital Stay (HOSPITAL_COMMUNITY)
Admission: AD | Admit: 2017-04-10 | Discharge: 2017-04-10 | Disposition: A | Discharge status: Home / Self Care | Payer: Medicare Other | Source: Ambulatory Visit | Attending: Obstetrics and Gynecology | Admitting: Obstetrics and Gynecology

## 2017-04-10 DIAGNOSIS — Z813 Family history of other psychoactive substance abuse and dependence: Secondary | ICD-10-CM | POA: Diagnosis not present

## 2017-04-10 DIAGNOSIS — O24313 Unspecified pre-existing diabetes mellitus in pregnancy, third trimester: Secondary | ICD-10-CM | POA: Diagnosis not present

## 2017-04-10 DIAGNOSIS — E1140 Type 2 diabetes mellitus with diabetic neuropathy, unspecified: Secondary | ICD-10-CM | POA: Insufficient documentation

## 2017-04-10 DIAGNOSIS — O26899 Other specified pregnancy related conditions, unspecified trimester: Secondary | ICD-10-CM

## 2017-04-10 DIAGNOSIS — Z91013 Allergy to seafood: Secondary | ICD-10-CM | POA: Diagnosis not present

## 2017-04-10 DIAGNOSIS — Z3A34 34 weeks gestation of pregnancy: Secondary | ICD-10-CM | POA: Diagnosis not present

## 2017-04-10 DIAGNOSIS — O9989 Other specified diseases and conditions complicating pregnancy, childbirth and the puerperium: Secondary | ICD-10-CM

## 2017-04-10 DIAGNOSIS — K0889 Other specified disorders of teeth and supporting structures: Secondary | ICD-10-CM | POA: Diagnosis present

## 2017-04-10 DIAGNOSIS — Z7984 Long term (current) use of oral hypoglycemic drugs: Secondary | ICD-10-CM | POA: Diagnosis not present

## 2017-04-10 DIAGNOSIS — O34219 Maternal care for unspecified type scar from previous cesarean delivery: Secondary | ICD-10-CM | POA: Diagnosis not present

## 2017-04-10 DIAGNOSIS — Z825 Family history of asthma and other chronic lower respiratory diseases: Secondary | ICD-10-CM | POA: Insufficient documentation

## 2017-04-10 DIAGNOSIS — O99333 Smoking (tobacco) complicating pregnancy, third trimester: Secondary | ICD-10-CM | POA: Diagnosis not present

## 2017-04-10 DIAGNOSIS — O403XX0 Polyhydramnios, third trimester, not applicable or unspecified: Secondary | ICD-10-CM

## 2017-04-10 DIAGNOSIS — F17210 Nicotine dependence, cigarettes, uncomplicated: Secondary | ICD-10-CM | POA: Insufficient documentation

## 2017-04-10 DIAGNOSIS — O99613 Diseases of the digestive system complicating pregnancy, third trimester: Secondary | ICD-10-CM | POA: Insufficient documentation

## 2017-04-10 DIAGNOSIS — O0993 Supervision of high risk pregnancy, unspecified, third trimester: Secondary | ICD-10-CM

## 2017-04-10 DIAGNOSIS — Z6791 Unspecified blood type, Rh negative: Secondary | ICD-10-CM

## 2017-04-10 DIAGNOSIS — O09299 Supervision of pregnancy with other poor reproductive or obstetric history, unspecified trimester: Secondary | ICD-10-CM

## 2017-04-10 MED ORDER — OXYCODONE-ACETAMINOPHEN 5-325 MG PO TABS
1.0000 | ORAL_TABLET | Freq: Once | ORAL | Status: AC
  Administered 2017-04-10: 1 via ORAL
  Filled 2017-04-10: qty 1

## 2017-04-10 MED ORDER — OXYCODONE-ACETAMINOPHEN 7.5-325 MG PO TABS: 1.0000 | ORAL_TABLET | Freq: Four times a day (QID) | ORAL | 0 refills | Status: DC | PRN

## 2017-04-10 NOTE — H&P (Signed)
MAU HISTORY AND PHYSICAL  Chief Complaint:  Dental Pain   Kathryn Mayo is a 31 y.o.  [id number] with IUP at [redacted]w[redacted]d presenting for dental pain. The pain is on the lower aspect of the posterior mouth over a tooth that was broken years ago. Pain initially started 4 months ago and has been on and off, over last week pain progressed to 10/10, throbbing, and radiates to jaw. No drainage, no foul taste, low grade fevers (Tmax 100.71F). Saw OB today said ibuprofen. Cannot eat or sleep. She reports pain is not palliated with XR tylenol.  She saw her OB earlier yesterday who recommended trying Advil and she reports this helped.  Last saw the dentist about 1.5 years ago. Has had cavities in the past. Has history of root canal about 10 years ago, hx wisdom teeth pulled.  History of diabetes was on insulin however had allergic reactions. Now back on glyburide and metformin. AM fasting glucose have been 170s.     Past Medical History:  Diagnosis Date  . Diabetes mellitus without complication (HCC)    2 types of oral, 2 types of insulin- no longer taking.  was old A1C is normal  . Heart murmur    when young  . Infection    uti  . Neuropathy   . Prior pregnancy with fetal demise 01/26/2017   2002 term IUFD      Guidelines for Antenatal Testing and Sonography  (with updated ICD-10 codes) INDICATION U/S NST/AFI DELIVERY Previous Stillbirth (> 28 wks) - O09.299 20-24-28-32-36 28//BPP wkly then 32//2 x wk 39    . Vaginal Pap smear, abnormal    age 74, doesn't think she has had one since    Past Surgical History:  Procedure Laterality Date  . CESAREAN SECTION     C/S x 2  . THERAPEUTIC ABORTION    . WISDOM TOOTH EXTRACTION      Family History  Problem Relation Age of Onset  . Asthma Mother   . Drug abuse Mother        died of overdose  . HIV/AIDS Mother        20  . Asthma Daughter   . Diabetes Maternal Grandmother   . Cancer Maternal Grandmother        breast    Social History   Substance Use Topics  . Smoking status: Current Every Day Smoker    Packs/day: 0.25    Years: 10.00    Types: Cigarettes, E-cigarettes  . Smokeless tobacco: Never Used     Comment: trying to stop  . Alcohol use No     Comment: 05/05/2013    Allergies  Allergen Reactions  . Shellfish Allergy Itching   Review of Systems - Negative except for what is mentioned in HPI.  Physical Exam  Blood pressure 124/66, pulse 94, temperature 98.2 F (36.8 C), resp. rate 18, height 4\' 11"  (1.499 m), weight 105.7 kg (233 lb), last menstrual period 08/14/2016, unknown if currently breastfeeding. GENERAL: Well-developed, well-nourished female in no acute distress.  Mouth: +multiple previous filled cavities, +broken lower molar, no redness or drainage. +tenderness to palpation over gums near the tooth and exterior jaw. No edema to suggest cellulitis. Psych: Affect appropriate, thought process linear  FHT:  145 bmp, moderate variability, accels, no decels Contractions: Occasional   Kathryn Mayo is  31 y.o. [id number] at [redacted]w[redacted]d presents with Dental Pain .  Plan:  Dentalgia - likely secondary to broken molar on bottom left. No signs  of infection at this time.  - patient to call for dentist appointment today - letter given for permission to proceed dental care while pregnant - percocet 7.5/325 given x1 in MAU - will give 10 pills percocet to take home - return precautions provided  Howard PouchLauren Feng, MD 6/2/20184:13 AM  OB FELLOW MAU DISCHARGE ATTESTATION  I have seen and examined this patient; I agree with above documentation in the resident's note.   I personally reviewed the patient's NST today, found to be REACTIVE. 135 bpm, mod var, +accels, no decels. CTX: None.   Jen MowElizabeth Mumaw, DO OB Fellow

## 2017-04-10 NOTE — Discharge Instructions (Signed)
Dental Pain Dental pain may be caused by many things, including:  Tooth decay (cavities or caries). Cavities expose the nerve of your tooth to air and hot or cold temperatures. This can cause pain or discomfort.  Abscess or infection. A dental abscess is a collection of infected pus from a bacterial infection in the inner part of the tooth (pulp). It usually occurs at the end of the tooths root.  Injury.  An unknown reason (idiopathic).  Your pain may be mild or severe. It may only occur when:  You are chewing.  You are exposed to hot or cold temperature.  You are eating or drinking sugary foods or beverages, such as soda or candy.  Your pain may also be constant. Follow these instructions at home: Watch your dental pain for any changes. The following actions may help to lessen any discomfort that you are feeling:  Take medicines only as directed by your dentist.  If you were prescribed an antibiotic medicine, finish all of it even if you start to feel better.  Keep all follow-up visits as directed by your dentist. This is important.  Do not apply heat to the outside of your face.  Rinse your mouth or gargle with salt water if directed by your dentist. This helps with pain and swelling. ? You can make salt water by adding  tsp of salt to 1 cup of warm water.  Apply ice to the painful area of your face: ? Put ice in a plastic bag. ? Place a towel between your skin and the bag. ? Leave the ice on for 20 minutes, 2-3 times per day.  Avoid foods or drinks that cause you pain, such as: ? Very hot or very cold foods or drinks. ? Sweet or sugary foods or drinks.  Contact a health care provider if:  Your pain is not controlled with medicines.  Your symptoms are worse.  You have new symptoms. Get help right away if:  You are unable to open your mouth.  You are having trouble breathing or swallowing.  You have a fever.  Your face, neck, or jaw is swollen. This  information is not intended to replace advice given to you by your health care provider. Make sure you discuss any questions you have with your health care provider. Document Released: 10/26/2005 Document Revised: 03/05/2016 Document Reviewed: 10/22/2014 Elsevier Interactive Patient Education  2017 [employer] You were seen in Maternity Admissions Unit for tooth pain.  - Please schedule a dentist appointment as soon as possible so the source of your pain can be addressed - I will discharge you with a small amount of pain medication. There is tylenol in this medication so do not take any additional tylenol with this medication.   - there are no signs of infection at this time. If you develop fevers higher than 100.3F, swelling or redness in your face, or difficulty breathing, these may be signs of an infection. In this case, please call a healthcare provider immediately or come back in to be seen.

## 2017-04-10 NOTE — MAU Note (Signed)
Unbearable toothache for past wk. Taking tylenol and not helping. Tooth is L bottom in back. Took advil and helped briefly. Does not have a Education officer, communitydentist.

## 2017-04-13 ENCOUNTER — Telehealth: Payer: Self-pay | Admitting: Family Medicine

## 2017-04-13 ENCOUNTER — Encounter: Payer: Self-pay | Admitting: Advanced Practice Midwife

## 2017-04-13 NOTE — Telephone Encounter (Signed)
Called patient due to missed follow up appointment. Left message for patient to return call asap regarding appointment.

## 2017-04-14 ENCOUNTER — Encounter: Payer: Self-pay | Admitting: Obstetrics and Gynecology

## 2017-04-15 ENCOUNTER — Encounter: Payer: Self-pay | Admitting: Obstetrics & Gynecology

## 2017-04-15 ENCOUNTER — Ambulatory Visit (INDEPENDENT_AMBULATORY_CARE_PROVIDER_SITE_OTHER): Payer: Medicare Other | Admitting: Obstetrics & Gynecology

## 2017-04-15 VITALS — BP 134/72 | HR 104 | Wt 223.3 lb

## 2017-04-15 DIAGNOSIS — O0993 Supervision of high risk pregnancy, unspecified, third trimester: Secondary | ICD-10-CM

## 2017-04-15 DIAGNOSIS — O24119 Pre-existing diabetes mellitus, type 2, in pregnancy, unspecified trimester: Secondary | ICD-10-CM

## 2017-04-15 NOTE — Progress Notes (Signed)
BPP 8/8 6 days ago    PRENATAL VISIT NOTE  Subjective:  Kathryn Mayo is a 31 y.o. [id number] at 7850w6d being seen today for ongoing prenatal care.  She is currently monitored for the following issues for this high-risk pregnancy and has History of miscarriage; Supervision of high-risk pregnancy; Depression affecting pregnancy; PTSD (post-traumatic stress disorder); Obesity affecting pregnancy; Type 2 diabetes mellitus affecting pregnancy, antepartum; Poor dentition; Smoker; Rh negative, antepartum; Prior pregnancy with fetal demise; Allergy to insulin; and Polyhydramnios on her problem list.  Patient reports no complaints.  Contractions: Irregular. Vag. Bleeding: None.  Movement: Present. Denies leaking of fluid.   The following portions of the patient's history were reviewed and updated as appropriate: allergies, current medications, past family history, past medical history, past social history, past surgical history and problem list. Problem list updated.  Objective:   Vitals:   04/15/17 0850  BP: 134/72  Pulse: (!) 104  Weight: 223 lb 4.8 oz (101.3 kg)    Fetal Status: Fetal Heart Rate (bpm): 140 Fundal Height: 37 cm Movement: Present     General:  Alert, oriented and cooperative. Patient is in no acute distress.  Skin: Skin is warm and dry. No rash noted.   Cardiovascular: Normal heart rate noted  Respiratory: Normal respiratory effort, no problems with respiration noted  Abdomen: Soft, gravid, appropriate for gestational age. Pain/Pressure: Present     Pelvic:  Cervical exam deferred        Extremities: Normal range of motion.  Edema: Trace  Mental Status: Normal mood and affect. Normal behavior. Normal judgment and thought content.   Assessment and Plan:  Pregnancy: [id number] at 5250w6d  1. Type 2 diabetes mellitus affecting pregnancy, antepartum BG nl recent due to tooth extraction and recovery, not eating much  2. Supervision of high risk pregnancy in third  trimester Polyhydramnios, BPP 8/8, repeat tomorrow  Preterm labor symptoms and general obstetric precautions including but not limited to vaginal bleeding, contractions, leaking of fluid and fetal movement were reviewed in detail with the patient. Please refer to After Visit Summary for other counseling recommendations.  Return in about 1 week (around 04/22/2017).   Scheryl DarterJames Betty Daidone, MD

## 2017-04-15 NOTE — Patient Instructions (Signed)
Polyhydramnios When a woman becomes pregnant, a sac is formed around the fertilized egg (embryo) and later the growing baby (fetus). This sac is called the amniotic sac. The amniotic sac is filled with fluid. It gets bigger as the pregnancy grows. When there is too much fluid in the sac, it is called polyhydramnios. All babies born with polyhydramnios should be checked for congenital abnormalities. The amniotic fluid cushions and protects the baby. It also provides the baby with fluids and is crucial to normal development. Your baby breathes this fluid into its lungs and swallows it. This helps promote the healthy growth of the lungs and gastrointestinal tract. Amniotic fluid also helps the baby move around, helping with the normal development of muscle and bone. What are the causes? This condition is caused by:  Diabetes mellitus.  Down's syndrome, fetal abnormalities of the intestinal tract, and anencephaly (fetus without brain), all of which can prevent the fetus from swallowing the amniotic fluid.  One twins passes (transfuses) its blood into the other twin (twin-twin transfusion syndrome).  Medical illness of the mother, e.g., kidney or heart disease.  Tumor (chorioangioma) of the placenta.  What are the signs or symptoms? Symptoms of this condition include:  Enlarged uterus. The size of the uterus enlarges beyond what it should be for that particular time of the pregnancy.  Pressure and discomfort. The mother may feel more pressure and discomfort than should be expected.  Quick and unexpected enlargement of the mother's stomach.  How is this diagnosed? This condition is diagnosed when your health care provider measures you and notices that your uterus is beyond the size that is consistent with the time of the pregnancy.  An ultrasound is then used (abdominally or vaginally) to: ? See if you are carrying twins or more. ? Measure the growth of the baby. ? Look for birth  defects. ? Measure the amount of fluid in the amniotic sac.  Amniotic Fluid Index (AFI) measures the amount of fluid in the amniotic sac in four different areas. If there is more than 24 centimeters, you have polyhydramnios. How is this treated? This condition may be treated by:  Removing some fluid from the amniotic sac.  Taking medications that lower the fluids in your body.  Stopping the use of salt or salty foods because it causes you to keep fluid in your body (retention).  Follow these instructions at home:  Keep all your prenatal visits as told by your health care provider. It is important to follow your health care provider's recommendations.  Do not eat a lot of salt and salty foods.  If you have diabetes, keep it under control.  If you have heart or kidney disease, treat the disease as told by your health care provider. Contact a health care provider if:  You think your uterus has grown too fast in a short period of time.  You feel a great amount of pressure in your lower belly (pelvis) and are more uncomfortable than expected. Get help right away if:  You have a gush of fluid or are leaking fluid from your vagina.  You stop feeling the baby move.  You do not feel the baby kicking as much as usual.  You have a hard time keeping your diabetes under control.  You are having problems with your heart or kidney disease. This information is not intended to replace advice given to you by your health care provider. Make sure you discuss any questions you have with your health   care provider. Document Released: 01/16/2003 Document Revised: 04/02/2016 Document Reviewed: 05/25/2013 Elsevier Interactive Patient Education  2018 Elsevier Inc.  

## 2017-04-16 ENCOUNTER — Encounter (HOSPITAL_COMMUNITY): Payer: Self-pay

## 2017-04-16 ENCOUNTER — Ambulatory Visit (HOSPITAL_COMMUNITY)
Admission: RE | Admit: 2017-04-16 | Discharge: 2017-04-16 | Disposition: A | Discharge status: Home / Self Care | Payer: Medicare Other | Source: Ambulatory Visit | Attending: Obstetrics & Gynecology | Admitting: Obstetrics & Gynecology

## 2017-04-16 DIAGNOSIS — O24313 Unspecified pre-existing diabetes mellitus in pregnancy, third trimester: Secondary | ICD-10-CM | POA: Insufficient documentation

## 2017-04-16 DIAGNOSIS — Z3A35 35 weeks gestation of pregnancy: Secondary | ICD-10-CM | POA: Diagnosis not present

## 2017-04-16 DIAGNOSIS — O09299 Supervision of pregnancy with other poor reproductive or obstetric history, unspecified trimester: Secondary | ICD-10-CM | POA: Insufficient documentation

## 2017-04-16 DIAGNOSIS — O34219 Maternal care for unspecified type scar from previous cesarean delivery: Secondary | ICD-10-CM | POA: Insufficient documentation

## 2017-04-16 DIAGNOSIS — O409XX0 Polyhydramnios, unspecified trimester, not applicable or unspecified: Secondary | ICD-10-CM

## 2017-04-16 DIAGNOSIS — O99333 Smoking (tobacco) complicating pregnancy, third trimester: Secondary | ICD-10-CM | POA: Insufficient documentation

## 2017-04-16 DIAGNOSIS — O403XX0 Polyhydramnios, third trimester, not applicable or unspecified: Secondary | ICD-10-CM | POA: Diagnosis not present

## 2017-04-16 NOTE — Progress Notes (Signed)
Pt had a tooth pulled a few days ago, not checking blood sugar and not taking metformin or glyburide.

## 2017-04-21 ENCOUNTER — Encounter (HOSPITAL_COMMUNITY): Payer: Self-pay | Admitting: *Deleted

## 2017-04-21 ENCOUNTER — Ambulatory Visit (INDEPENDENT_AMBULATORY_CARE_PROVIDER_SITE_OTHER): Payer: Medicare Other | Admitting: Obstetrics and Gynecology

## 2017-04-21 ENCOUNTER — Encounter: Payer: Self-pay | Admitting: Obstetrics and Gynecology

## 2017-04-21 ENCOUNTER — Inpatient Hospital Stay (HOSPITAL_COMMUNITY)
Admission: AD | Admit: 2017-04-21 | Discharge: 2017-05-03 | DRG: 765 | Disposition: A | Discharge status: Home / Self Care | Payer: Medicare Other | Source: Ambulatory Visit | Attending: Obstetrics and Gynecology | Admitting: Obstetrics and Gynecology

## 2017-04-21 ENCOUNTER — Other Ambulatory Visit: Payer: Self-pay | Admitting: Obstetrics and Gynecology

## 2017-04-21 VITALS — BP 121/67 | HR 84 | Wt 226.7 lb

## 2017-04-21 DIAGNOSIS — Z7984 Long term (current) use of oral hypoglycemic drugs: Secondary | ICD-10-CM | POA: Diagnosis not present

## 2017-04-21 DIAGNOSIS — E0865 Diabetes mellitus due to underlying condition with hyperglycemia: Secondary | ICD-10-CM

## 2017-04-21 DIAGNOSIS — Z6791 Unspecified blood type, Rh negative: Secondary | ICD-10-CM | POA: Diagnosis not present

## 2017-04-21 DIAGNOSIS — Z9119 Patient's noncompliance with other medical treatment and regimen: Secondary | ICD-10-CM

## 2017-04-21 DIAGNOSIS — O99214 Obesity complicating childbirth: Secondary | ICD-10-CM | POA: Diagnosis present

## 2017-04-21 DIAGNOSIS — O403XX0 Polyhydramnios, third trimester, not applicable or unspecified: Secondary | ICD-10-CM | POA: Diagnosis present

## 2017-04-21 DIAGNOSIS — O26893 Other specified pregnancy related conditions, third trimester: Secondary | ICD-10-CM | POA: Diagnosis present

## 2017-04-21 DIAGNOSIS — F17210 Nicotine dependence, cigarettes, uncomplicated: Secondary | ICD-10-CM | POA: Diagnosis present

## 2017-04-21 DIAGNOSIS — O99334 Smoking (tobacco) complicating childbirth: Secondary | ICD-10-CM | POA: Diagnosis present

## 2017-04-21 DIAGNOSIS — E1165 Type 2 diabetes mellitus with hyperglycemia: Secondary | ICD-10-CM | POA: Diagnosis present

## 2017-04-21 DIAGNOSIS — O99213 Obesity complicating pregnancy, third trimester: Secondary | ICD-10-CM

## 2017-04-21 DIAGNOSIS — Z3A35 35 weeks gestation of pregnancy: Secondary | ICD-10-CM

## 2017-04-21 DIAGNOSIS — O0993 Supervision of high risk pregnancy, unspecified, third trimester: Secondary | ICD-10-CM

## 2017-04-21 DIAGNOSIS — Z98891 History of uterine scar from previous surgery: Secondary | ICD-10-CM

## 2017-04-21 DIAGNOSIS — O24013 Pre-existing diabetes mellitus, type 1, in pregnancy, third trimester: Secondary | ICD-10-CM | POA: Diagnosis present

## 2017-04-21 DIAGNOSIS — O24113 Pre-existing diabetes mellitus, type 2, in pregnancy, third trimester: Secondary | ICD-10-CM | POA: Diagnosis present

## 2017-04-21 DIAGNOSIS — O321XX0 Maternal care for breech presentation, not applicable or unspecified: Secondary | ICD-10-CM | POA: Diagnosis present

## 2017-04-21 DIAGNOSIS — Z6841 Body Mass Index (BMI) 40.0 and over, adult: Secondary | ICD-10-CM

## 2017-04-21 DIAGNOSIS — O329XX0 Maternal care for malpresentation of fetus, unspecified, not applicable or unspecified: Secondary | ICD-10-CM | POA: Diagnosis not present

## 2017-04-21 DIAGNOSIS — O34219 Maternal care for unspecified type scar from previous cesarean delivery: Secondary | ICD-10-CM

## 2017-04-21 DIAGNOSIS — O2412 Pre-existing diabetes mellitus, type 2, in childbirth: Secondary | ICD-10-CM | POA: Diagnosis present

## 2017-04-21 DIAGNOSIS — O26899 Other specified pregnancy related conditions, unspecified trimester: Secondary | ICD-10-CM

## 2017-04-21 DIAGNOSIS — E119 Type 2 diabetes mellitus without complications: Secondary | ICD-10-CM | POA: Diagnosis not present

## 2017-04-21 DIAGNOSIS — O3663X0 Maternal care for excessive fetal growth, third trimester, not applicable or unspecified: Secondary | ICD-10-CM | POA: Diagnosis present

## 2017-04-21 DIAGNOSIS — O34211 Maternal care for low transverse scar from previous cesarean delivery: Secondary | ICD-10-CM | POA: Diagnosis present

## 2017-04-21 DIAGNOSIS — Z3A36 36 weeks gestation of pregnancy: Secondary | ICD-10-CM | POA: Diagnosis not present

## 2017-04-21 DIAGNOSIS — O09299 Supervision of pregnancy with other poor reproductive or obstetric history, unspecified trimester: Secondary | ICD-10-CM

## 2017-04-21 DIAGNOSIS — Z3A37 37 weeks gestation of pregnancy: Secondary | ICD-10-CM | POA: Diagnosis not present

## 2017-04-21 DIAGNOSIS — IMO0002 Reserved for concepts with insufficient information to code with codable children: Secondary | ICD-10-CM

## 2017-04-21 DIAGNOSIS — O24119 Pre-existing diabetes mellitus, type 2, in pregnancy, unspecified trimester: Secondary | ICD-10-CM

## 2017-04-21 LAB — POCT URINALYSIS DIP (DEVICE)
Bilirubin Urine: NEGATIVE
GLUCOSE, UA: 100 mg/dL — AB
HGB URINE DIPSTICK: NEGATIVE
Ketones, ur: 80 mg/dL — AB
Leukocytes, UA: NEGATIVE
Nitrite: NEGATIVE
PH: 5.5 (ref 5.0–8.0)
PROTEIN: NEGATIVE mg/dL
SPECIFIC GRAVITY, URINE: 1.015 (ref 1.005–1.030)
UROBILINOGEN UA: 0.2 mg/dL (ref 0.0–1.0)

## 2017-04-21 LAB — RAPID URINE DRUG SCREEN, HOSP PERFORMED
Amphetamines: NOT DETECTED
BARBITURATES: NOT DETECTED
BENZODIAZEPINES: NOT DETECTED
Cocaine: NOT DETECTED
Opiates: NOT DETECTED
Tetrahydrocannabinol: NOT DETECTED

## 2017-04-21 LAB — GLUCOSE, CAPILLARY
GLUCOSE-CAPILLARY: 279 mg/dL — AB (ref 65–99)
Glucose-Capillary: 330 mg/dL — ABNORMAL HIGH (ref 65–99)
Glucose-Capillary: 363 mg/dL — ABNORMAL HIGH (ref 65–99)
Glucose-Capillary: 312 mg/dL — ABNORMAL HIGH (ref 65–99)

## 2017-04-21 LAB — CBC
HCT: 37.8 % (ref 36.0–46.0)
Hemoglobin: 13.3 g/dL (ref 12.0–15.0)
MCH: 29 pg (ref 26.0–34.0)
MCHC: 35.2 g/dL (ref 30.0–36.0)
MCV: 82.5 fL (ref 78.0–100.0)
PLATELETS: 250 10*3/uL (ref 150–400)
RBC: 4.58 MIL/uL (ref 3.87–5.11)
RDW: 13.3 % (ref 11.5–15.5)
WBC: 5.3 10*3/uL (ref 4.0–10.5)

## 2017-04-21 LAB — URINALYSIS, ROUTINE W REFLEX MICROSCOPIC
HGB URINE DIPSTICK: NEGATIVE
Leukocytes, UA: NEGATIVE
Nitrite: NEGATIVE
PH: 6 (ref 5.0–8.0)
PROTEIN: NEGATIVE mg/dL
Specific Gravity, Urine: 1.02 (ref 1.005–1.030)

## 2017-04-21 LAB — URINALYSIS, MICROSCOPIC (REFLEX)
BACTERIA UA: NONE SEEN
RBC / HPF: NONE SEEN RBC/hpf (ref 0–5)

## 2017-04-21 LAB — TYPE AND SCREEN
ABO/RH(D): B NEG
ANTIBODY SCREEN: NEGATIVE

## 2017-04-21 MED ORDER — PRENATAL MULTIVITAMIN CH
1.0000 | ORAL_TABLET | Freq: Every day | ORAL | Status: DC
  Administered 2017-04-21 – 2017-04-27 (×7): 1 via ORAL
  Filled 2017-04-21 (×8): qty 1

## 2017-04-21 MED ORDER — DOCUSATE SODIUM 100 MG PO CAPS: 100.0000 mg | ORAL_CAPSULE | Freq: Two times a day (BID) | ORAL | Status: DC | PRN

## 2017-04-21 MED ORDER — INSULIN ASPART 100 UNIT/ML ~~LOC~~ SOLN
0.0000 [IU] | Freq: Four times a day (QID) | SUBCUTANEOUS | Status: DC
  Administered 2017-04-21: 8 [IU] via SUBCUTANEOUS

## 2017-04-21 MED ORDER — SODIUM CHLORIDE 0.9% FLUSH
3.0000 mL | Freq: Two times a day (BID) | INTRAVENOUS | Status: DC
  Administered 2017-04-21 – 2017-04-29 (×10): 3 mL via INTRAVENOUS

## 2017-04-21 MED ORDER — INSULIN ASPART 100 UNIT/ML ~~LOC~~ SOLN
10.0000 [IU] | Freq: Once | SUBCUTANEOUS | Status: AC
  Administered 2017-04-21: 10 [IU] via SUBCUTANEOUS

## 2017-04-21 MED ORDER — INSULIN NPH (HUMAN) (ISOPHANE) 100 UNIT/ML ~~LOC~~ SUSP
17.0000 [IU] | Freq: Two times a day (BID) | SUBCUTANEOUS | Status: DC
  Administered 2017-04-21 – 2017-04-23 (×4): 17 [IU] via SUBCUTANEOUS
  Filled 2017-04-21: qty 10

## 2017-04-21 MED ORDER — ACETAMINOPHEN 325 MG PO TABS: 650.0000 mg | ORAL_TABLET | ORAL | Status: DC | PRN

## 2017-04-21 MED ORDER — INSULIN ASPART 100 UNIT/ML ~~LOC~~ SOLN: 0.0000 [IU] | Freq: Three times a day (TID) | SUBCUTANEOUS | Status: DC

## 2017-04-21 MED ORDER — CALCIUM CARBONATE ANTACID 500 MG PO CHEW: 2.0000 | CHEWABLE_TABLET | ORAL | Status: DC | PRN

## 2017-04-21 MED ORDER — INSULIN ASPART 100 UNIT/ML ~~LOC~~ SOLN
0.0000 [IU] | Freq: Four times a day (QID) | SUBCUTANEOUS | Status: DC
  Administered 2017-04-21: 28 [IU] via SUBCUTANEOUS
  Administered 2017-04-22: 12 [IU] via SUBCUTANEOUS
  Administered 2017-04-22: 20 [IU] via SUBCUTANEOUS
  Administered 2017-04-22: 12 [IU] via SUBCUTANEOUS
  Administered 2017-04-23: 24 [IU] via SUBCUTANEOUS
  Administered 2017-04-23: 8 [IU] via SUBCUTANEOUS
  Administered 2017-04-23 (×2): 12 [IU] via SUBCUTANEOUS
  Administered 2017-04-23: 16 [IU] via SUBCUTANEOUS
  Administered 2017-04-24: 20 [IU] via SUBCUTANEOUS
  Administered 2017-04-24 (×2): 16 [IU] via SUBCUTANEOUS
  Administered 2017-04-24 – 2017-04-25 (×2): 20 [IU] via SUBCUTANEOUS
  Administered 2017-04-25: 12 [IU] via SUBCUTANEOUS
  Administered 2017-04-25: 16 [IU] via SUBCUTANEOUS
  Administered 2017-04-25: 12 [IU] via SUBCUTANEOUS
  Administered 2017-04-25 – 2017-04-26 (×2): 8 [IU] via SUBCUTANEOUS
  Administered 2017-04-26: 5 [IU] via SUBCUTANEOUS
  Administered 2017-04-26: 12 [IU] via SUBCUTANEOUS

## 2017-04-21 NOTE — Progress Notes (Signed)
Inpatient Diabetes Program Recommendations  AACE/ADA: New Consensus Statement on Inpatient Glycemic Control (2015)  Target Ranges:  Prepandial:   less than 140 mg/dL      Peak postprandial:   less than 180 mg/dL (1-2 hours)      Critically ill patients:  140 - 180 mg/dL   Lab Results  Component Value Date   GLUCAP 279 (H) 04/21/2017   HGBA1C 9.4 (H) 03/12/2017   Review of Glycemic Control  Diabetes history: DM 2, Gestational  Spoke with patient about DM control at home. Patient reports struggling to lower glucose levels due to her multiple reactions to insulin in the past. Patient had an oral procedure 5 days ago stopped taking oral meds due to not eating. Patient reports glucose was at the lowest when on orals and insulin but only dropped to the 170's at that time. She was self medicating with benadryl just to take the insulin to lower her glucose. Patient reports when taking the "cloudy insulin" (NPH) she had the lesser skin reaction out of all the insulins. Patient reports being "scared for her baby" and is worried.  Patient has been eating salads and eating minimal to keep her glucose levels from increasing. Discussed importance of balanced diet. Patient would not eat and take her insulin.  Spoke with Dr. Macon LargeAnyanwu about patient's case. Patient with localized reactions to insulin. Patient already had Novolog and has not reported skin reaction redness and itching thus far.  Will order NPH and Novolog while here and monitor for any reaction. Will watch trends while here.  Thanks,  Christena DeemShannon Arlene Genova RN, MSN, [facility] Inpatient Diabetes Coordinator Team Pager [phone] (8a-5p)

## 2017-04-21 NOTE — Progress Notes (Signed)
See admission h&p Pt would like to go home first and then come back for direct admission. Will do NST and if reactive then that's fine. House coverage aware.   NST reactive 145 baseline, +accels, no decels, mod variability, one uc  Kathryn Mayo, Kathryn HagemanJr MD Attending Center for [facility] (Faculty Practice) 04/21/2017 Time: [id number]

## 2017-04-21 NOTE — Progress Notes (Signed)
Dr. Despina HiddenEure notified of pt's CBG of 312. See new orders.

## 2017-04-21 NOTE — H&P (Signed)
Obstetrics Admission History & Physical  04/21/2017 - 8:41 AM Primary OBGYN: [facility]  Chief Complaint: blood sugar control, poor patient compliance  History of Present Illness  31 y.o. [id number] @ [redacted]w[redacted]d, with the above CC. Pregnancy complicated by: DM2, poor patient compliance, BMI 45, h/o c-section x 2, Rh negative, tobacco abuse, polyhydramnios, h/o term IUFD.  [name] states that she stopped taking her metformin (1000 after breakfast and 1000 qhs) and glyburide (10/10, taken with metformin) a few weeks ago and stopped checking her BS values at that time, too, b/c she had her teeth pulled and wasn't eating as much so she thought she didn't see a need to.   Patient didn't eat anything today except coffee with splenda.   No decreased FM or PTL s/s.   Review of Systems:  as noted in the History of Present Illness.  Patient Active Problem List   Diagnosis Date Noted  . History of cesarean delivery, currently pregnant 04/21/2017  . Polyhydramnios 03/05/2017  . Allergy to insulin 02/02/2017  . Prior pregnancy with fetal demise 01/26/2017  . Rh negative, antepartum 12/21/2016  . Smoker 11/23/2016  . Depression affecting pregnancy 10/27/2016  . PTSD (post-traumatic stress disorder) 10/27/2016  . Obesity affecting pregnancy 10/27/2016  . Type 2 diabetes mellitus affecting pregnancy, antepartum 10/27/2016  . Poor dentition 10/27/2016  . Supervision of high-risk pregnancy 10/26/2016  . History of miscarriage 05/18/2013    PMHx:  Past Medical History:  Diagnosis Date  . Diabetes mellitus without complication (HCC)    2 types of oral, 2 types of insulin- no longer taking.  was old A1C is normal  . Heart murmur    when young  . Infection    uti  . Neuropathy   . Prior pregnancy with fetal demise 01/26/2017   2002 term IUFD      Guidelines for Antenatal Testing and Sonography  (with updated ICD-10 codes) INDICATION U/S NST/AFI DELIVERY Previous Stillbirth (>  28 wks) - O09.299 20-24-28-32-36 28//BPP wkly then 32//2 x wk 39    . Vaginal Pap smear, abnormal    age 62, doesn't think she has had one since   PSHx:  Past Surgical History:  Procedure Laterality Date  . CESAREAN SECTION     C/S x 2  . THERAPEUTIC ABORTION    . WISDOM TOOTH EXTRACTION     Medications: PNV, ASA 81  Allergies: is allergic to shellfish allergy. OBHx:  OB History  Gravida Para Term Preterm AB Living  8 3 3  0 4 2  SAB TAB Ectopic Multiple Live Births  2 2     2     # Outcome Date GA Lbr Len/2nd Weight Sex Delivery Anes PTL Lv  8 Current           7 SAB 2016          6 SAB 2015          5 TAB 2008          4 Term 2007    F CS-Unspec Spinal  LIV  3 Term 2005    M CS-Unspec   LIV  2 TAB 2004          1 Term 2002 [redacted]w[redacted]d   F    FD                 FHx:  Family History  Problem Relation Age of Onset  . Asthma Mother   . Drug abuse  Mother        died of overdose  . HIV/AIDS Mother        [id number]  . Asthma Daughter   . Diabetes Maternal Grandmother   . Cancer Maternal Grandmother        breast   Soc Hx:  Social History   Social History  . Marital status: Single    Spouse name: N/A  . Number of children: N/A  . Years of education: N/A   Occupational History  . Not on file.   Social History Main Topics  . Smoking status: Current Every Day Smoker    Packs/day: 0.25    Years: 10.00    Types: Cigarettes, E-cigarettes  . Smokeless tobacco: Never Used     Comment: trying to stop  . Alcohol use No     Comment: 05/05/2013  . Drug use: Yes    Types: Marijuana     Comment: last used 2 days ago 10/24/16  NONE  DURING  PREG    . Sexual activity: Yes    Birth control/ protection: None   Other Topics Concern  . Not on file   Social History Narrative  . No narrative on file    Objective  121/67 84 226lbs FHR 146  NST pending  General: Well nourished, well developed female in no acute distress.  Skin:  Warm and dry.  Respiratory:  Normal  respiratory effort Neuro/Psych:  Normal mood and affect.   Labs  CBG 330  Radiology 6/8: MVP 8.2, AFI 24, breech, BPP 8/8 6/1: 80%, 2639gm, AC >97%   Assessment & Plan   31 y.o. [id number] @ 5470w5d with poor DM2 control, currently stable *Pregnancy: declines BTL. qshift NSTs. Needs AFI tomorrow *DM2: followed by [facility] (see care everywhere) but hasn't seen them in a few months. Pt states she has injection site reaction to various types of insulin (no SOB, difficulty with breathing). D/w her that she needs admission and possibly 37wk delivery, after d/w MFM, and possible admission until delivery. D/w her IUFD risk and need for better control. Pt amenable to admission and would like to go home first. Will do NST and if reactive, pt okay to go home. Pt told to check BS when she gets home and still high then take her AM metformin and glyburide. -DM education consulted -will do am fasting and 2hr PP BS checks and SSI ordered for now. If reaction, can just do IV *h/o prior c-section: needs scheduling when delivery timing determined after d/w MFM *Rh neg: s/p rhogam already *h/o prior IUFD: see above *GBS: ordered to be collected on admit *PPx: SCDs, OOB ad lib *FEN/GI: IV saline lock, DM2 diet.  Cornelia Copaharlie Kenzel Ruesch, Jr. MD Attending Center for [facility](Faculty Practice)

## 2017-04-21 NOTE — Progress Notes (Signed)
I introduced Spiritual Care services to pt at nurse's referral due to new diagnosis.  Kathryn Mayo was in good spirits and appears to be coping well with being in the hospital and with her diabetes.  She has good family support and her husband brought her 2 children (ages 810 and 2912) up to the hospital.  She is eager to just get her baby here so that she can see that he is okay, but she knows he needs to stay in a little longer.  I noted in her chart that she has a history of miscarriage and fetal demise.  She did not bring this up and I did not ask her directly.  If she begins talking about this and needs further support to process this, please page and let us know.  Chaplain Dyanne CarrelKaty Lesbia Ottaway, Bcc Pager, [phone] 3:49 PM

## 2017-04-22 ENCOUNTER — Inpatient Hospital Stay (HOSPITAL_COMMUNITY): Payer: Medicare Other

## 2017-04-22 DIAGNOSIS — Z3A35 35 weeks gestation of pregnancy: Secondary | ICD-10-CM

## 2017-04-22 LAB — HEMOGLOBIN A1C
Hgb A1c MFr Bld: 11.4 % — ABNORMAL HIGH (ref 4.8–5.6)
Mean Plasma Glucose: 280 mg/dL

## 2017-04-22 LAB — GC/CHLAMYDIA PROBE AMP (~~LOC~~) NOT AT ARMC
Chlamydia: NEGATIVE
Neisseria Gonorrhea: NEGATIVE

## 2017-04-22 LAB — GLUCOSE, CAPILLARY
GLUCOSE-CAPILLARY: 241 mg/dL — AB (ref 65–99)
GLUCOSE-CAPILLARY: 228 mg/dL — AB (ref 65–99)
Glucose-Capillary: 288 mg/dL — ABNORMAL HIGH (ref 65–99)
Glucose-Capillary: 162 mg/dL — ABNORMAL HIGH (ref 65–99)
Glucose-Capillary: 240 mg/dL — ABNORMAL HIGH (ref 65–99)

## 2017-04-22 MED ORDER — INSULIN ASPART 100 UNIT/ML ~~LOC~~ SOLN
6.0000 [IU] | Freq: Three times a day (TID) | SUBCUTANEOUS | Status: DC
  Administered 2017-04-22: 6 [IU] via SUBCUTANEOUS

## 2017-04-22 MED ORDER — DIPHENHYDRAMINE HCL 25 MG PO CAPS
25.0000 mg | ORAL_CAPSULE | Freq: Three times a day (TID) | ORAL | Status: DC
  Administered 2017-04-22 – 2017-04-29 (×10): 25 mg via ORAL
  Filled 2017-04-22 (×12): qty 1

## 2017-04-22 MED ORDER — INSULIN ASPART 100 UNIT/ML ~~LOC~~ SOLN
6.0000 [IU] | Freq: Three times a day (TID) | SUBCUTANEOUS | Status: DC
  Administered 2017-04-22 – 2017-04-23 (×3): 6 [IU] via SUBCUTANEOUS

## 2017-04-22 NOTE — Progress Notes (Signed)
Patient ID: [name], female   DOB: 07/06/1986, 31 y.o.   MRN: [id number] FACULTY PRACTICE ANTEPARTUM(COMPREHENSIVE) NOTE  Latisa Laray AngerM Kirven is a 31 y.o. [id number] at 5073w6d  who is admitted for uncontrolled diabetes.   Fetal presentation is cephalic. Length of Stay:  1  Days  Subjective:  Patient reports the fetal movement as active. Patient reports uterine contraction  activity as none. Patient reports  vaginal bleeding as none. Patient describes fluid per vagina as None.  Vitals:  Blood pressure 107/64, pulse 95, temperature 98.1 F (36.7 C), temperature source Oral, resp. rate 16, height 4\' 11"  (1.499 m), weight 227 lb 0.6 oz (103 kg), last menstrual period 08/14/2016, SpO2 100 %, unknown if currently breastfeeding. Physical Examination:  General appearance - alert, well appearing, and in no distress Heart - normal rate and regular rhythm Abdomen - soft, nontender, nondistended Fundal Height:  size equals dates Cervical Exam: Not evaluated.  . Extremities: extremities normal, atraumatic, no cyanosis or edema and Homans sign is negative, no sign of DVT  Membranes:intact  Fetal Monitoring:  Fetal Heart Rate A  Mode External filed at 04/22/2017 1059  Baseline Rate (A) 140 bpm filed at 04/22/2017 1059  Variability 6-25 BPM filed at 04/22/2017 1059  Accelerations 15 x 15 filed at 04/22/2017 1059  Decelerations None filed at 04/22/2017 1059     Labs:  Results for orders placed or performed during the hospital encounter of 04/21/17 (from the past 24 hour(s))  Glucose, capillary   Collection Time: 04/21/17  5:19 PM  Result Value Ref Range   Glucose-Capillary 363 (H) 65 - 99 mg/dL  Glucose, capillary   Collection Time: 04/21/17 10:33 PM  Result Value Ref Range   Glucose-Capillary 312 (H) 65 - 99 mg/dL  Glucose, capillary   Collection Time: 04/22/17  6:33 AM  Result Value Ref Range   Glucose-Capillary 288 (H) 65 - 99 mg/dL  Glucose, capillary   Collection Time: 04/22/17   8:47 AM  Result Value Ref Range   Glucose-Capillary 241 (H) 65 - 99 mg/dL  Glucose, capillary   Collection Time: 04/22/17 10:24 AM  Result Value Ref Range   Glucose-Capillary 228 (H) 65 - 99 mg/dL    Imaging Studies:     Currently EPIC will not allow sonographic studies to automatically populate into notes.  In the meantime, copy and paste results into note or free text.  Medications:  Scheduled . insulin aspart  0-32 Units Subcutaneous QID  . insulin aspart  6 Units Subcutaneous TID WC  . insulin NPH Human  17 Units Subcutaneous BID AC & HS  . prenatal multivitamin  1 tablet Oral Q1200  . sodium chloride flush  3 mL Intravenous Q12H   I have reviewed the patient's current medications.  ASSESSMENT: Patient Active Problem List   Diagnosis Date Noted  . History of cesarean delivery, currently pregnant 04/21/2017  . DM2 (diabetes mellitus, type 2) (HCC) 04/21/2017  . Polyhydramnios 03/05/2017  . Allergy to insulin 02/02/2017  . Prior pregnancy with fetal demise 01/26/2017  . Rh negative, antepartum 12/21/2016  . Smoker 11/23/2016  . Depression affecting pregnancy 10/27/2016  . PTSD (post-traumatic stress disorder) 10/27/2016  . Obesity affecting pregnancy 10/27/2016  . Type 2 diabetes mellitus affecting pregnancy, antepartum 10/27/2016  . Poor dentition 10/27/2016  . Supervision of high-risk pregnancy 10/26/2016  . History of miscarriage 05/18/2013    PLAN: Continue to work on BG control. She has minor local skin reaction to insulin and I will add  Benadryl to help this.  Scheryl Darter 04/22/2017,2:08 PM

## 2017-04-22 NOTE — Progress Notes (Signed)
Received a call from  Dietary services that the pt had maxed out on her carbohydrate intake. They would not take her dinner order until they knew how many carbs she could go over. Pt very frustrated and tearful. Called Dr. Debroah LoopArnold to inform him of the situation. He approved that she could go over her 30 g carb intake for today. Pt refused to order food from the cafeteria and said her husband would bring her dinner when he comes between 10 and 11 pm.

## 2017-04-22 NOTE — Progress Notes (Signed)
Initial Nutrition Assessment  DOCUMENTATION CODES:  Morbid obesity  INTERVENTION:  Carbohydrate modified gestational diabetic diet  NUTRITION DIAGNOSIS:  Increased nutrient needs related to (S)  (pregnancy and fetal growth requirments) as evidenced by  (35 weeks IUP).  GOAL:  Patient will meet greater than or equal to 90% of their needs, glycemic control  MONITOR:  Labs  REASON FOR ASSESSMENT:  Antenatal, Gestational Diabetes   ASSESSMENT:  35 6/7 weeks, adm for glycemic control. [facility] Weight at 10 weeks 206 lbs. BMI 41.7    overall 21 lb weight gain  Diet Order:  Diet gestational carb mod Room service appropriate? Yes; Fluid consistency: Thin  Height:   Ht Readings from Last 1 Encounters:  04/21/17 4\' 11"  (1.499 m)  Weight:   Wt Readings from Last 1 Encounters:  04/21/17 227 lb 0.6 oz (103 kg)   Ideal Body Weight:    90-100 lbs BMI:  Body mass index is 45.86 kg/m.  Estimated Nutritional Needs:  Kcal:  2000-2200 Protein:  90-100 g Fluid:  2.3 g  EDUCATION NEEDS:   No education needs identified at this time [facility] - stated she understood diabetic diet  Elisabeth CaraKatherine Brynlea Spindler M.Odis LusterEd. R.D. LDN Neonatal Nutrition Support Specialist/RD III Pager [phone]      Phone [phone]

## 2017-04-22 NOTE — Progress Notes (Signed)
Inpatient Diabetes Program Recommendations  AACE/ADA: New Consensus Statement on Inpatient Glycemic Control (2015)  Target Ranges:  Prepandial:   less than 140 mg/dL      Peak postprandial:   less than 180 mg/dL (1-2 hours)      Critically ill patients:  140 - 180 mg/dL   Review of Glycemic Control  In April Patient saw Dr. Tedd SiasSolum Endocrinologist patient at that time was on NPH 32 units QAM, 12 units QPM, Glyburide 5 mg BID, Regular insulin 16 units breakfast, 12 units dinner, Metformin 500 BID  At that time patient was switched to Levemir 44 units and Apidra 12 units meal coverage in addition to Correction scale 2-12 units.  Spoke with Dr. Despina HiddenEure this am about plan of care. Watch patient on current dose of NPH add set meal coverage for patient.  Thanks,  Christena DeemShannon Aily Tzeng RN, MSN, [facility] Inpatient Diabetes Coordinator Team Pager [phone] (8a-5p)

## 2017-04-22 NOTE — Progress Notes (Signed)
Dr. Debroah LoopArnold notified of patient's dime sized reddened welt at site of insulin injection. No change to orders at this time.

## 2017-04-23 ENCOUNTER — Ambulatory Visit (HOSPITAL_COMMUNITY): Admission: RE | Admit: 2017-04-23 | Payer: Medicare Other | Source: Ambulatory Visit

## 2017-04-23 ENCOUNTER — Encounter: Payer: Self-pay | Admitting: Obstetrics & Gynecology

## 2017-04-23 DIAGNOSIS — O24113 Pre-existing diabetes mellitus, type 2, in pregnancy, third trimester: Secondary | ICD-10-CM | POA: Diagnosis not present

## 2017-04-23 LAB — GLUCOSE, CAPILLARY
GLUCOSE-CAPILLARY: 240 mg/dL — AB (ref 65–99)
Glucose-Capillary: 353 mg/dL — ABNORMAL HIGH (ref 65–99)
Glucose-Capillary: 279 mg/dL — ABNORMAL HIGH (ref 65–99)

## 2017-04-23 LAB — RPR: RPR Ser Ql: NONREACTIVE

## 2017-04-23 MED ORDER — INSULIN NPH (HUMAN) (ISOPHANE) 100 UNIT/ML ~~LOC~~ SUSP
22.0000 [IU] | Freq: Two times a day (BID) | SUBCUTANEOUS | Status: DC
  Administered 2017-04-23: 22 [IU] via SUBCUTANEOUS

## 2017-04-23 MED ORDER — INSULIN ASPART 100 UNIT/ML ~~LOC~~ SOLN
8.0000 [IU] | Freq: Three times a day (TID) | SUBCUTANEOUS | Status: DC
  Administered 2017-04-23 – 2017-04-24 (×4): 8 [IU] via SUBCUTANEOUS

## 2017-04-23 NOTE — Progress Notes (Signed)
Inpatient Diabetes Program Recommendations  Diabetes Treatment Program Recommendations  ADA Standards of Care 2018 Diabetes in Pregnancy Target Glucose Ranges:  Fasting: 60 - 90 mg/dL Preprandial: 60 - 161105 mg/dL 1 hr postprandial: Less than 140mg /dL (from first bite of meal) 2 hr postprandial: Less than 120 mg/dL (from first bite of meal)    Results for Kathryn Mayo, Kathryn Mayo (MRN [id number]) as of 04/23/2017 09:29  Ref. Range 04/22/2017 06:33 04/22/2017 08:47 04/22/2017 10:24 04/22/2017 16:11 04/22/2017 18:25 04/23/2017 01:19 04/23/2017 06:20  Glucose-Capillary Latest Ref Range: 65 - 99 mg/dL 811288 (H) 914241 (H) 782228 (H) 162 (H) 240 (H) 353 (H) 279 (H)    Review of Glycemic Control  Current orders for Inpatient glycemic control: NPH 17 units BID, Novolog 6 units TID with meals, Novolog 0-32 units QID (fasting and 2 hour post prandial)  Inpatient Diabetes Program Recommendations: Insulin - Basal: Please consider increasing NPH to 22 units BID. Insulin - Meal Coverage: Please consider increasing meal coverage to Novolog 8 units TID with meals.  Thanks, Orlando PennerMarie Brita Jurgensen, RN, MSN, CDE Diabetes Coordinator Inpatient Diabetes Program [phone] (Team Pager from 8am to 5pm)

## 2017-04-23 NOTE — Progress Notes (Signed)
FACULTY PRACTICE ANTEPARTUM(COMPREHENSIVE) NOTE  Kathryn Mayo is a 31 y.o. [id number] at 7735w0d who is admitted for uncontrolled diabetes.   Fetal presentation is breech. Length of Stay:  2  Days  Subjective: Itches at injection sites Patient reports the fetal movement as active. Patient reports uterine contraction  activity as none. Patient reports  vaginal bleeding as none. Patient describes fluid per vagina as None.  Vitals:  Blood pressure (!) 112/52, pulse 82, temperature 97.9 F (36.6 C), temperature source Oral, resp. rate 16, height 4\' 11"  (1.499 m), weight 227 lb 0.6 oz (103 kg), last menstrual period 08/14/2016, SpO2 100 %, unknown if currently breastfeeding. Physical Examination:  General appearance - alert, well appearing, and in no distress Heart - normal rate and regular rhythm Abdomen - soft, nontender, nondistended Fundal Height:  size greater than dates Cervical Exam: Not evaluated. . Extremities: extremities normal, atraumatic, no cyanosis or edema and Homans sign is negative, no sign of DVT  Membranes:intact  Fetal Monitoring:   Fetal Heart Rate A  Mode External filed at 04/22/2017 2216  Baseline Rate (A) 140 bpm filed at 04/22/2017 2216  Variability 6-25 BPM filed at 04/22/2017 2216  Accelerations 15 x 15 filed at 04/22/2017 2216  Decelerations None filed at 04/22/2017 2216     Labs:  Results for orders placed or performed during the hospital encounter of 04/21/17 (from the past 24 hour(s))  Glucose, capillary   Collection Time: 04/22/17  8:47 AM  Result Value Ref Range   Glucose-Capillary 241 (H) 65 - 99 mg/dL  Glucose, capillary   Collection Time: 04/22/17 10:24 AM  Result Value Ref Range   Glucose-Capillary 228 (H) 65 - 99 mg/dL  Glucose, capillary   Collection Time: 04/22/17  4:11 PM  Result Value Ref Range   Glucose-Capillary 162 (H) 65 - 99 mg/dL  Glucose, capillary   Collection Time: 04/22/17  6:25 PM  Result Value Ref Range   Glucose-Capillary 240 (H) 65 - 99 mg/dL  Glucose, capillary   Collection Time: 04/23/17  1:19 AM  Result Value Ref Range   Glucose-Capillary 353 (H) 65 - 99 mg/dL  Glucose, capillary   Collection Time: 04/23/17  6:20 AM  Result Value Ref Range   Glucose-Capillary 279 (H) 65 - 99 mg/dL      Medications:  Scheduled . diphenhydrAMINE  25 mg Oral TID  . insulin aspart  0-32 Units Subcutaneous QID  . insulin aspart  6 Units Subcutaneous TID WC  . insulin NPH Human  17 Units Subcutaneous BID AC & HS  . prenatal multivitamin  1 tablet Oral Q1200  . sodium chloride flush  3 mL Intravenous Q12H   I have reviewed the patient's current medications.  ASSESSMENT: Patient Active Problem List   Diagnosis Date Noted  . History of cesarean delivery, currently pregnant 04/21/2017  . DM2 (diabetes mellitus, type 2) (HCC) 04/21/2017  . Polyhydramnios 03/05/2017  . Allergy to insulin 02/02/2017  . Prior pregnancy with fetal demise 01/26/2017  . Rh negative, antepartum 12/21/2016  . Smoker 11/23/2016  . Depression affecting pregnancy 10/27/2016  . PTSD (post-traumatic stress disorder) 10/27/2016  . Obesity affecting pregnancy 10/27/2016  . Type 2 diabetes mellitus affecting pregnancy, antepartum 10/27/2016  . Poor dentition 10/27/2016  . Supervision of high-risk pregnancy 10/26/2016  . History of miscarriage 05/18/2013    PLAN: Discuss with diabetes management re uncontrolled BG   Scheryl DarterJames Eran Windish 04/23/2017,6:40 AM

## 2017-04-24 DIAGNOSIS — O321XX0 Maternal care for breech presentation, not applicable or unspecified: Secondary | ICD-10-CM

## 2017-04-24 DIAGNOSIS — O403XX0 Polyhydramnios, third trimester, not applicable or unspecified: Secondary | ICD-10-CM

## 2017-04-24 LAB — CULTURE, BETA STREP (GROUP B ONLY)

## 2017-04-24 MED ORDER — INSULIN NPH (HUMAN) (ISOPHANE) 100 UNIT/ML ~~LOC~~ SUSP
41.0000 [IU] | Freq: Every day | SUBCUTANEOUS | Status: DC
  Administered 2017-04-25 – 2017-04-26 (×2): 41 [IU] via SUBCUTANEOUS
  Filled 2017-04-24: qty 10

## 2017-04-24 MED ORDER — INSULIN NPH (HUMAN) (ISOPHANE) 100 UNIT/ML ~~LOC~~ SUSP: 51.0000 [IU] | Freq: Every day | SUBCUTANEOUS | Status: DC

## 2017-04-24 MED ORDER — INSULIN ASPART 100 UNIT/ML ~~LOC~~ SOLN
12.0000 [IU] | Freq: Three times a day (TID) | SUBCUTANEOUS | Status: DC
  Administered 2017-04-24 – 2017-04-26 (×6): 12 [IU] via SUBCUTANEOUS

## 2017-04-24 MED ORDER — HYDROCORTISONE 1 % EX CREA
TOPICAL_CREAM | Freq: Four times a day (QID) | CUTANEOUS | Status: DC
  Administered 2017-04-24 – 2017-04-29 (×16): via TOPICAL
  Filled 2017-04-24: qty 28

## 2017-04-24 MED ORDER — INSULIN NPH (HUMAN) (ISOPHANE) 100 UNIT/ML ~~LOC~~ SUSP: 19.0000 [IU] | Freq: Every day | SUBCUTANEOUS | Status: DC

## 2017-04-24 MED ORDER — INSULIN NPH (HUMAN) (ISOPHANE) 100 UNIT/ML ~~LOC~~ SUSP
26.0000 [IU] | Freq: Two times a day (BID) | SUBCUTANEOUS | Status: AC
  Administered 2017-04-24 (×2): 26 [IU] via SUBCUTANEOUS

## 2017-04-24 MED ORDER — INSULIN NPH (HUMAN) (ISOPHANE) 100 UNIT/ML ~~LOC~~ SUSP
16.0000 [IU] | Freq: Every day | SUBCUTANEOUS | Status: DC
  Administered 2017-04-25: 16 [IU] via SUBCUTANEOUS
  Filled 2017-04-24: qty 10

## 2017-04-24 NOTE — Progress Notes (Addendum)
FACULTY PRACTICE ANTEPARTUM(COMPREHENSIVE) NOTE  Kathryn Mayo is a 31 y.o. [id number] at 3482w1d  who is admitted for glycemic control.    Fetal presentation is breech. Length of Stay:  3  Days  Date of admission:04/21/2017  Subjective: Patient is doing well without complaints, continues to experience pruritic rash at insulin injection site Patient reports the fetal movement as active. Patient reports uterine contraction  activity as none. Patient reports  vaginal bleeding as none. Patient describes fluid per vagina as None.  Vitals:  Blood pressure 124/70, pulse 88, temperature 97.7 F (36.5 C), temperature source Oral, resp. rate 18, height 4\' 11"  (1.499 m), weight 227 lb 0.6 oz (103 kg), last menstrual period 08/14/2016, SpO2 100 %, unknown if currently breastfeeding. Vitals:   04/23/17 0728 04/23/17 1703 04/23/17 1945 04/24/17 0545  BP: 120/76 118/69 (!) 104/59 124/70  Pulse: 90 (!) 104 80 88  Resp: 16 18 18 18   Temp:  97.7 F (36.5 C) 97.8 F (36.6 C) 97.7 F (36.5 C)  TempSrc:  Oral Oral Oral  SpO2: 99% 96% 100% 100%  Weight:      Height:       Physical Examination:  General appearance - alert, well appearing, and in no distress Fundal Height:  size equals dates Pelvic Exam:  examination not indicated Cervical Exam: Not evaluated.  Extremities: extremities normal, atraumatic, no cyanosis or edema with DTRs 2+ bilaterally Membranes:intact  Fetal Monitoring:  Baseline: 140 bpm, Variability: Good {> 6 bpm), Accelerations: Reactive and Decelerations: Absent   reactive  Labs:  Results for orders placed or performed during the hospital encounter of 04/21/17 (from the past 24 hour(s))  Glucose, capillary   Collection Time: 04/23/17 10:47 AM  Result Value Ref Range   Glucose-Capillary 240 (H) 65 - 99 mg/dL   Comment 1 Notify RN     Imaging Studies:    Currently EPIC will not allow sonographic studies to automatically populate into notes.  In the meantime, copy and paste  results into note or free text.  Medications:  Scheduled . diphenhydrAMINE  25 mg Oral TID  . insulin aspart  0-32 Units Subcutaneous QID  . insulin aspart  8 Units Subcutaneous TID WC  . insulin NPH Human  26 Units Subcutaneous BID AC & HS  . prenatal multivitamin  1 tablet Oral Q1200  . sodium chloride flush  3 mL Intravenous Q12H   I have reviewed the patient's current medications.  ASSESSMENT: [id number] 6682w1d Estimated Date of Delivery: 05/21/17  Patient Active Problem List   Diagnosis Date Noted  . History of cesarean delivery, currently pregnant 04/21/2017  . DM2 (diabetes mellitus, type 2) (HCC) 04/21/2017  . Polyhydramnios 03/05/2017  . Allergy to insulin 02/02/2017  . Prior pregnancy with fetal demise 01/26/2017  . Rh negative, antepartum 12/21/2016  . Smoker 11/23/2016  . Depression affecting pregnancy 10/27/2016  . PTSD (post-traumatic stress disorder) 10/27/2016  . Obesity affecting pregnancy 10/27/2016  . Type 2 diabetes mellitus affecting pregnancy, antepartum 10/27/2016  . Poor dentition 10/27/2016  . Supervision of high-risk pregnancy 10/26/2016  . History of miscarriage 05/18/2013    PLAN: - CBG values remain out of range.  - Will increase NPH to 26 BID - Patient aware of delivery by c-section due to fetal malpresentation - Discussed possible delivery at 37 weeks given poorly controlled diabetes. Will consult with MFM for possible amniocentesis for fetal lung maturity - Continue close monitoring  Teja Judice 04/24/2017,6:22 AM

## 2017-04-24 NOTE — Progress Notes (Signed)
Fasting CBG this am 273-16units given 2hr post prandial from breakfast 291- 20units given 2hr post prandial from lunch 276- 16units given

## 2017-04-24 NOTE — Progress Notes (Signed)
OB Notes To start today, 6/16, meal coverage changed to 12 units To start tomorrow, 6/17: NPH 41/16 and aspart 10/21/11 Will add 0300 CBG check   Cornelia Copaharlie Jessilynn Taft, Jr MD Attending Center for [facility] (Faculty Practice) 04/24/2017 Time: [phone]

## 2017-04-25 DIAGNOSIS — O329XX0 Maternal care for malpresentation of fetus, unspecified, not applicable or unspecified: Secondary | ICD-10-CM | POA: Diagnosis present

## 2017-04-25 DIAGNOSIS — O24113 Pre-existing diabetes mellitus, type 2, in pregnancy, third trimester: Secondary | ICD-10-CM

## 2017-04-25 LAB — TYPE AND SCREEN
ABO/RH(D): B NEG
Antibody Screen: NEGATIVE

## 2017-04-25 LAB — CBC
HEMATOCRIT: 37 % (ref 36.0–46.0)
HEMOGLOBIN: 12.5 g/dL (ref 12.0–15.0)
MCH: 28.5 pg (ref 26.0–34.0)
MCHC: 33.8 g/dL (ref 30.0–36.0)
MCV: 84.3 fL (ref 78.0–100.0)
Platelets: 206 10*3/uL (ref 150–400)
RBC: 4.39 MIL/uL (ref 3.87–5.11)
RDW: 13.8 % (ref 11.5–15.5)
WBC: 6.8 10*3/uL (ref 4.0–10.5)

## 2017-04-25 NOTE — Progress Notes (Signed)
6/17 2100 CBG 292- 20 units given 6/17 0300 CBG 205- spoke with Dr Vergie LivingPickens use SS to cover 12 units given

## 2017-04-25 NOTE — Progress Notes (Signed)
Daily Antepartum Note  Admission Date: 04/21/2017 Current Date: 04/25/2017 7:27 AM  Clista Laray Anger is a 31 y.o. Z6X0960 @ [redacted]w[redacted]d, HD#5, admitted for BS control.  Pregnancy complicated by: Patient Active Problem List   Diagnosis Date Noted  . Malpresentation before onset of labor 04/25/2017  . History of cesarean delivery, currently pregnant 04/21/2017  . DM2 (diabetes mellitus, type 2) (HCC) 04/21/2017  . Polyhydramnios 03/05/2017  . Allergy to insulin 02/02/2017  . Prior pregnancy with fetal demise 01/26/2017  . Rh negative, antepartum 12/21/2016  . Smoker 11/23/2016  . Depression affecting pregnancy 10/27/2016  . PTSD (post-traumatic stress disorder) 10/27/2016  . Obesity affecting pregnancy 10/27/2016  . Type 2 diabetes mellitus affecting pregnancy, antepartum 10/27/2016  . Poor dentition 10/27/2016  . Supervision of high-risk pregnancy 10/26/2016  . History of miscarriage 05/18/2013   Overnight/24hr events:  Insulin regimen increased  Subjective:  No s/s of PTL or decreased FM.   Objective:    Current Vital Signs 24h Vital Sign Ranges  T 97.9 F (36.6 C) Temp  Avg: 98 F (36.7 C)  Min: 97.3 F (36.3 C)  Max: 98.2 F (36.8 C)  BP 110/66 BP  Min: 110/66  Max: 136/76  HR 91 Pulse  Avg: 96.2  Min: 90  Max: 103  RR 16 Resp  Avg: 17.2  Min: 16  Max: 18  SaO2 100 % Not Delivered SpO2  Avg: 100 %  Min: 100 %  Max: 100 %       24 Hour I/O Current Shift I/O  Time Ins Outs No intake/output data recorded. No intake/output data recorded.   NST 1 FHT 145 baseline, +accels, no decels, mod var Toco: one UC  NST 2 FHT 135 baseline, +accels, no decels, mod var Toco: quiet  Physical exam: General: Well nourished, well developed female in no acute distress. Abdomen: gravid, nttp Cardiovascular: S1, S2 normal, no murmur, rub or gallop, regular rate and rhythm Respiratory: CTAB Extremities: no clubbing, cyanosis or edema Skin: Warm and dry.   Medications: Current  Facility-Administered Medications  Medication Dose Route Frequency Provider Last Rate Last Dose  . acetaminophen (TYLENOL) tablet 650 mg  650 mg Oral Q4H PRN [place] Bing, MD      . calcium carbonate (TUMS - dosed in mg elemental calcium) chewable tablet 400 mg of elemental calcium  2 tablet Oral Q4H PRN [place] Bing, MD      . diphenhydrAMINE (BENADRYL) capsule 25 mg  25 mg Oral TID Adam Phenix, MD   25 mg at 04/24/17 2254  . docusate sodium (COLACE) capsule 100 mg  100 mg Oral BID PRN [place] Bing, MD      . hydrocortisone cream 1 %   Topical QID [place] Bing, MD      . insulin aspart (novoLOG) injection 0-32 Units  0-32 Units Subcutaneous QID Anyanwu, Jethro Bastos, MD   8 Units at 04/25/17 0616  . insulin aspart (novoLOG) injection 12 Units  12 Units Subcutaneous TID WC [place] Bing, MD   12 Units at 04/24/17 1855  . insulin NPH Human (HUMULIN N,NOVOLIN N) injection 16 Units  16 Units Subcutaneous QHS Darianne Muralles, MD      . insulin NPH Human (HUMULIN N,NOVOLIN N) injection 41 Units  41 Units Subcutaneous QAC breakfast [place] Bing, MD      . prenatal multivitamin tablet 1 tablet  1 tablet Oral Q1200  Bing, MD   1 tablet at 04/24/17 1249  . sodium chloride flush (NS) 0.9 %  injection 3 mL  3 mL Intravenous Q12H Anyanwu, Ugonna A, MD   3 mL at 04/24/17 2253    Labs:   Recent Labs Lab 04/21/17 1202  WBC 5.3  HGB 13.3  HCT 37.8  PLT 250   AM fasting: 273 2hr PP: 291/276/292 0300: 205  Radiology: no new imaging 6/14: breech, AFI 25.4, MVP 8.1, BPP 8/8  Assessment & Plan:  Pt stable  *Pregnancy: declines BTL. qshift NSTs.  *DM2: new insulin regimen to start this morning with NPH 41/16 and aspart 10/21/11 from NPH 26/26 and aspart 8/8/8 -followed by [facility] (see care everywhere) but hasn't seen them in a few months.  -DM education consulted -will do am fasting and 2hr PP BS checks and SSI ordered for now. If reaction, can just do  IV *h/o prior c-section: needs scheduling when delivery timing determined after d/w MFM tomorrow *Rh neg: s/p rhogam already *h/o prior IUFD: see above *PPx: SCDs, OOB ad lib *FEN/GI: IV saline lock, DM2 diet.   Cornelia Copaharlie Sameen Leas, Jr. MD Attending Center for [facility](Faculty Practice)

## 2017-04-25 NOTE — Progress Notes (Signed)
CBG @ 1015AM 215 gave 12U Novolog CBG 147 at afternoon spot check pt feeling tired CBG 1758 CBG 287 Gave 20U Novolog

## 2017-04-26 ENCOUNTER — Inpatient Hospital Stay (HOSPITAL_COMMUNITY): Payer: Medicare Other

## 2017-04-26 DIAGNOSIS — O24013 Pre-existing diabetes mellitus, type 1, in pregnancy, third trimester: Secondary | ICD-10-CM

## 2017-04-26 LAB — GLUCOSE, CAPILLARY
GLUCOSE-CAPILLARY: 195 mg/dL — AB (ref 65–99)
GLUCOSE-CAPILLARY: 291 mg/dL — AB (ref 65–99)
GLUCOSE-CAPILLARY: 215 mg/dL — AB (ref 65–99)
GLUCOSE-CAPILLARY: 142 mg/dL — AB (ref 65–99)
GLUCOSE-CAPILLARY: 287 mg/dL — AB (ref 65–99)
GLUCOSE-CAPILLARY: 255 mg/dL — AB (ref 65–99)
Glucose-Capillary: 221 mg/dL — ABNORMAL HIGH (ref 65–99)
Glucose-Capillary: 273 mg/dL — ABNORMAL HIGH (ref 65–99)
Glucose-Capillary: 276 mg/dL — ABNORMAL HIGH (ref 65–99)
Glucose-Capillary: 272 mg/dL — ABNORMAL HIGH (ref 65–99)
Glucose-Capillary: 205 mg/dL — ABNORMAL HIGH (ref 65–99)
Glucose-Capillary: 171 mg/dL — ABNORMAL HIGH (ref 65–99)
Glucose-Capillary: 242 mg/dL — ABNORMAL HIGH (ref 65–99)
Glucose-Capillary: 196 mg/dL — ABNORMAL HIGH (ref 65–99)
Glucose-Capillary: 157 mg/dL — ABNORMAL HIGH (ref 65–99)
Glucose-Capillary: 211 mg/dL — ABNORMAL HIGH (ref 65–99)

## 2017-04-26 MED ORDER — METFORMIN HCL 500 MG PO TABS
500.0000 mg | ORAL_TABLET | Freq: Two times a day (BID) | ORAL | Status: DC
  Administered 2017-04-27 – 2017-04-28 (×3): 500 mg via ORAL
  Filled 2017-04-26 (×6): qty 1

## 2017-04-26 MED ORDER — INSULIN GLARGINE 100 UNIT/ML ~~LOC~~ SOLN
50.0000 [IU] | Freq: Two times a day (BID) | SUBCUTANEOUS | Status: DC
  Filled 2017-04-26 (×2): qty 0.5

## 2017-04-26 MED ORDER — INSULIN ASPART 100 UNIT/ML ~~LOC~~ SOLN
22.0000 [IU] | Freq: Three times a day (TID) | SUBCUTANEOUS | Status: DC
  Administered 2017-04-26 – 2017-04-27 (×2): 22 [IU] via SUBCUTANEOUS

## 2017-04-26 MED ORDER — INSULIN ASPART 100 UNIT/ML ~~LOC~~ SOLN
0.0000 [IU] | Freq: Three times a day (TID) | SUBCUTANEOUS | Status: DC
  Administered 2017-04-26: 12 [IU] via SUBCUTANEOUS
  Administered 2017-04-27: 8 [IU] via SUBCUTANEOUS
  Administered 2017-04-27: 12 [IU] via SUBCUTANEOUS

## 2017-04-26 MED ORDER — INSULIN NPH (HUMAN) (ISOPHANE) 100 UNIT/ML ~~LOC~~ SUSP
20.0000 [IU] | Freq: Two times a day (BID) | SUBCUTANEOUS | Status: DC
  Administered 2017-04-27 (×2): 20 [IU] via SUBCUTANEOUS

## 2017-04-26 NOTE — Care Management Important Message (Signed)
Important Message  Patient Details  Name: Kathryn Mayo MRN: [id number] Date of Birth: 02/11/1986   Medicare Important Message Given:  Yes    Renie OraHawkins, Zalen Sequeira Smith 04/26/2017, 11:42 AM

## 2017-04-26 NOTE — Care Management Important Message (Signed)
Important Message  Patient Details  Name: [name] MRN: [id number] Date of Birth: 06/24/1986   Medicare Important Message Given:  Yes    Renie OraHawkins, Kristin Barcus Smith 04/26/2017, 11:43 AM

## 2017-04-26 NOTE — Progress Notes (Signed)
2100 255 BS 6/17 2300 242 BS 6/17 0300 196 BS 6/18 0600 157 BS 6/18

## 2017-04-26 NOTE — Progress Notes (Signed)
Patient ID: Kathryn Mayo, female   DOB: 06/06/1986, 31 y.o.   MRN: [id number] [facility]  Will adjust insulin regiment for better glucose control Discussed case with Dr. Alroy BailiffPaul Whitecarr, MFM, who agrees with delivery at 37 weeks

## 2017-04-26 NOTE — Progress Notes (Signed)
FACULTY PRACTICE ANTEPARTUM(COMPREHENSIVE) NOTE  Kathryn Mayo is a 31 y.o. Z6X0960 at [redacted]w[redacted]d  who is admitted for glycemic control.    Fetal presentation is breech. Length of Stay:  5  Days  Date of admission:04/21/2017  Subjective: Patient is doing well without complaints Patient reports the fetal movement as active. Patient reports uterine contraction  activity as none. Patient reports  vaginal bleeding as none. Patient describes fluid per vagina as None.  Vitals:  Blood pressure 111/61, pulse 94, temperature 98.6 F (37 C), temperature source Oral, resp. rate 18, height 4\' 11"  (1.499 m), weight 227 lb 0.6 oz (103 kg), last menstrual period 08/14/2016, SpO2 97 %, unknown if currently breastfeeding. Vitals:   04/25/17 1625 04/25/17 2027 04/26/17 0606 04/26/17 0820  BP:  (!) 112/49 (!) 104/55 111/61  Pulse:  87 85 94  Resp:  18 18 18   Temp:  98.6 F (37 C) 98 F (36.7 C) 98.6 F (37 C)  TempSrc:  Oral Oral Oral  SpO2: 99% 100% 99% 97%  Weight:      Height:       Physical Examination:  General appearance - alert, well appearing, and in no distress Fundal Height:  size equals dates Pelvic Exam:  examination not indicated Cervical Exam: Not evaluated.  Extremities: extremities normal, atraumatic, no cyanosis or edema with DTRs 2+ bilaterally Membranes:intact  Fetal Monitoring:  Baseline: 140 bpm, Variability: Good {> 6 bpm), Accelerations: Reactive and Decelerations: Absent   reactive  Labs:  0600 273 BS 6/17 1015 215 BS 6/17 1758 287 BS 6/17 2100 255 BS 6/17 2300 242 BS 6/17 0300 196 BS 6/18 0600 157 BS 6/18 No results found for this or any previous visit (from the past 24 hour(s)).  Imaging Studies:    Currently EPIC will not allow sonographic studies to automatically populate into notes.  In the meantime, copy and paste results into note or free text.  Medications:  Scheduled . diphenhydrAMINE  25 mg Oral TID  . hydrocortisone cream   Topical QID  . insulin  aspart  0-32 Units Subcutaneous QID  . insulin aspart  12 Units Subcutaneous TID WC  . insulin NPH Human  16 Units Subcutaneous QHS  . insulin NPH Human  41 Units Subcutaneous QAC breakfast  . prenatal multivitamin  1 tablet Oral Q1200  . sodium chloride flush  3 mL Intravenous Q12H   I have reviewed the patient's current medications.  ASSESSMENT: [id number] [redacted]w[redacted]d Estimated Date of Delivery: 05/21/17  Patient Active Problem List   Diagnosis Date Noted  . Malpresentation before onset of labor 04/25/2017  . History of cesarean delivery, currently pregnant 04/21/2017  . DM2 (diabetes mellitus, type 2) (HCC) 04/21/2017  . Polyhydramnios 03/05/2017  . Allergy to insulin 02/02/2017  . Prior pregnancy with fetal demise 01/26/2017  . Rh negative, antepartum 12/21/2016  . Smoker 11/23/2016  . Depression affecting pregnancy 10/27/2016  . PTSD (post-traumatic stress disorder) 10/27/2016  . Obesity affecting pregnancy 10/27/2016  . Type 2 diabetes mellitus affecting pregnancy, antepartum 10/27/2016  . Poor dentition 10/27/2016  . Supervision of high-risk pregnancy 10/26/2016  . History of miscarriage 05/18/2013    PLAN: - CBG values remain out of range.  - Dr Alysia Penna and [facility]) will review chart and determing insulin regimen change, may also need endocrinology input - Patient aware of delivery by c-section due to fetal malpresentation - Discussed possible delivery at 37 weeks given poorly controlled diabetes. Will consult with MFM for delivery plan,  also ordered growth scan - Continue close monitoring  Jaynie CollinsUgonna Jaquise Faux, MD 04/26/2017,8:56 AM

## 2017-04-27 ENCOUNTER — Inpatient Hospital Stay (HOSPITAL_COMMUNITY): Payer: Medicare Other

## 2017-04-27 LAB — GLUCOSE, CAPILLARY
GLUCOSE-CAPILLARY: 147 mg/dL — AB (ref 65–99)
GLUCOSE-CAPILLARY: 205 mg/dL — AB (ref 65–99)
GLUCOSE-CAPILLARY: 232 mg/dL — AB (ref 65–99)
Glucose-Capillary: 238 mg/dL — ABNORMAL HIGH (ref 65–99)
Glucose-Capillary: 152 mg/dL — ABNORMAL HIGH (ref 65–99)
Glucose-Capillary: 199 mg/dL — ABNORMAL HIGH (ref 65–99)
Glucose-Capillary: 195 mg/dL — ABNORMAL HIGH (ref 65–99)

## 2017-04-27 SURGERY — Surgical Case
Anesthesia: *Unknown

## 2017-04-27 MED ORDER — INSULIN ASPART 100 UNIT/ML ~~LOC~~ SOLN
22.0000 [IU] | Freq: Three times a day (TID) | SUBCUTANEOUS | Status: DC
  Administered 2017-04-27: 22 [IU] via SUBCUTANEOUS

## 2017-04-27 MED ORDER — INSULIN NPH (HUMAN) (ISOPHANE) 100 UNIT/ML ~~LOC~~ SUSP
20.0000 [IU] | Freq: Two times a day (BID) | SUBCUTANEOUS | Status: DC
  Administered 2017-04-28: 20 [IU] via SUBCUTANEOUS

## 2017-04-27 MED ORDER — SODIUM CHLORIDE 0.9 % IV SOLN
INTRAVENOUS | Status: DC
  Filled 2017-04-27: qty 1

## 2017-04-27 MED ORDER — INSULIN ASPART 100 UNIT/ML ~~LOC~~ SOLN: 22.0000 [IU] | Freq: Three times a day (TID) | SUBCUTANEOUS | Status: DC

## 2017-04-27 MED ORDER — INSULIN ASPART 100 UNIT/ML ~~LOC~~ SOLN
0.0000 [IU] | Freq: Three times a day (TID) | SUBCUTANEOUS | Status: DC
  Administered 2017-04-27: 8 [IU] via SUBCUTANEOUS
  Administered 2017-04-28: 5 [IU] via SUBCUTANEOUS
  Administered 2017-04-28 (×2): 12 [IU] via SUBCUTANEOUS
  Administered 2017-04-29: 8 [IU] via SUBCUTANEOUS

## 2017-04-27 MED ORDER — INSULIN REGULAR BOLUS VIA INFUSION
0.0000 [IU] | Freq: Three times a day (TID) | INTRAVENOUS | Status: DC
  Filled 2017-04-27: qty 10

## 2017-04-27 MED ORDER — INSULIN ASPART 100 UNIT/ML ~~LOC~~ SOLN: 0.0000 [IU] | Freq: Three times a day (TID) | SUBCUTANEOUS | Status: DC

## 2017-04-27 MED ORDER — LACTATED RINGERS IV SOLN: INTRAVENOUS | Status: DC

## 2017-04-27 NOTE — Progress Notes (Signed)
Patient ID: Kathryn Mayo, female   DOB: 03/25/1986, 31 y.o.   MRN: [id number] FACULTY PRACTICE ANTEPARTUM(COMPREHENSIVE) NOTE  Kathryn Mayo is a 31 y.o. [id number] at 953w4d by best clinical estimate who is admitted for poorly controlled diabetes.   Fetal presentation is breech. Length of Stay:  6  Days  Subjective: Patient was upset with the staff last pm, due to being told she could not leave the hospital. This am she is much more amenable to plan. Patient reports the fetal movement as active. Patient reports uterine contraction  activity as none. Patient reports  vaginal bleeding as none. Patient describes fluid per vagina as None.  Vitals:  Blood pressure 101/61, pulse 93, temperature 98.4 F (36.9 C), temperature source Oral, resp. rate 18, height 4\' 11"  (1.499 m), weight 227 lb 0.6 oz (103 kg), last menstrual period 08/14/2016, SpO2 98 %, unknown if currently breastfeeding. Physical Examination:  General appearance - alert, well appearing, and in no distress Chest - normal effort Abdomen - gravid, NT Fundal Height:  size equals dates Extremities: Homans sign is negative, no sign of DVT  Membranes:intact  Fetal Monitoring:  Baseline: 140 bpm, Variability: Good {> 6 bpm), Accelerations: Reactive and Decelerations: Absent  Labs:  Results for orders placed or performed during the hospital encounter of 04/21/17 (from the past 24 hour(s))  Glucose, capillary   Collection Time: 04/26/17  9:31 AM  Result Value Ref Range   Glucose-Capillary 211 (H) 65 - 99 mg/dL    Medications:  Scheduled . diphenhydrAMINE  25 mg Oral TID  . hydrocortisone cream   Topical QID  . insulin aspart  0-32 Units Subcutaneous TID PC & HS  . insulin aspart  22 Units Subcutaneous TID WC  . insulin NPH Human  20 Units Subcutaneous BID AC & HS  . metFORMIN  500 mg Oral BID WC  . prenatal multivitamin  1 tablet Oral Q1200  . sodium chloride flush  3 mL Intravenous Q12H   I have reviewed the patient's  current medications.  ASSESSMENT: Active Problems:   DM2 (diabetes mellitus, type 2) (HCC)   Malpresentation before onset of labor Previous C-section x 2 Poorly controlled diabetes Local site reaction to insulin  PLAN: Book for repeat C-section at 37 wks due to poor control and h/o IUFD Consult pharmacy about options for glycemic control, I.e. Other insulins? Added metformin to improve insulin sensitivity and increased insulin doses again   Reva Boresanya S Damion Kant, MD 04/27/2017,8:29 AM

## 2017-04-27 NOTE — Progress Notes (Signed)
Pt refusing her benadryl and hydrocortisone cream at this time.

## 2017-04-27 NOTE — Progress Notes (Signed)
Pt came to the desk and reported that she would like to know if they will still deliver her baby via c-section on Friday if she leaves right now because she is over this hospital. Pt reports that she will not be accepting anymore insulin from us because she will not be eating the hospital food. Asked pt if she would like for me to speak with the doctor and she reported "I don't care what you do." Pt then went back into her room. This RN called doctor and then consulted with charge nurse. Dr. Shawnie PonsPratt aware that pt is upset and is wanting to leave. Advised that if pt does leave, she will be signing out AMA. This RN spoke with pt in her room alongside of the charge nurse and asked pt about her plans. Pt reported that she truly does not care any longer and that she will starve herself until Friday. Pt reported that she has been here since Wednesday "and we have not been doing anything for her except pumping her with insulin". Advised pt that we are attempting to control her diabetes for the safety of her and her baby. Pt reported that if anything happens to her baby, she "will hold everyone responsible who is involved". Advised pt that we are only trying to help her but she has to work with us so that we can care for her and her baby. Pt reported that we need "to leave her the fuck alone". Pt refuses to answer anymore questions. Unaware of what pt ate for dinner as it was outside food and she will not answer anymore questions. Pt remains silent after multiple attempts to ask her questions.

## 2017-04-27 NOTE — Progress Notes (Signed)
CBG 147 at 0305 on 04/27/17.

## 2017-04-27 NOTE — Progress Notes (Signed)
Patient refused IV saline lock and insulin drip. Patient crying upon nurses entering the room stating she cannot be confined to the room and 'why didn't they do this when I came in?' and 'I feel like a [place]' and 'I'm over this whole thing, I'm over it' and 'I want to enjoy these last few days of my pregnancy and this is not it.  Physician notified

## 2017-04-27 NOTE — Progress Notes (Signed)
Pt does not want to wear her SCD's at this time.

## 2017-04-27 NOTE — Progress Notes (Signed)
Pt reported her family was bringing her food. When family arrived, pt said she was going downstairs to eat with them. Pt then showed back up to the unit approx 30 mins later with a grocery bag. Pt reported that she had gone to [employer] because she needed food. Advised pt that this is not allowed. MD made aware.

## 2017-04-27 NOTE — Progress Notes (Signed)
Inpatient Diabetes Program Recommendations  Diabetes Treatment Program Recommendations  ADA Standards of Care 2017 Diabetes in Pregnancy Target Glucose Ranges:  Fasting: 60 - 90 mg/dL Preprandial: 60 - 098105 mg/dL 1 hr postprandial: Less than 140mg /dL (from first bite of meal) 2 hr postprandial: Less than 120 mg/dL (from first bit of meal)   Lab Results  Component Value Date   GLUCAP 152 (H) 04/27/2017   HGBA1C 11.4 (H) 04/21/2017   Was paged by pharmacist Deloris concerning patient's reaction of redness @ injection site. Spoke with RN Everardo Pacificaitlin Field who plans to take a picture of the insulin injection site and place as documentation in chart. RN Luther ParodyCaitlin said the redness resolves from site in approx. 1 day. Spoke with Dr. Adrian BlackwaterStinson and reviewed option of IV insulin if patient prefers rather than continued subcutaneous sites.Patient has documented reaction @ insulin injection sites. Endocrinologist Dr. Tedd SiasSolum aware.  Thank you, Billy FischerJudy E. Yulitza Shorts, RN, MSN, CDE  Diabetes Coordinator Inpatient Glycemic Control Team Team Pager [phone] (8am-5pm) 04/27/2017 9:55 AM

## 2017-04-27 NOTE — Progress Notes (Signed)
Kathryn Mayo is well-known to MFM. She has poorly controlled type 2 diabetes with noncompliance. She has a history of 32 week IUFD. Her most recent MFM US showed polyhydramnios, breech presentation, and an LGA fetus. This case was discussed with Dr. Claudean SeveranceWhitecar yesterday. The patient has been resistive to inpatient management previously but is feeling better about it today. Dr. Claudean SeveranceWhitecar and I both agree that given the patient's poorly controlled DM, the LGA infant and polyhydramnios as well as her compliance issues that delivery at 37 weeks is fully justified. Because of her control problems, forgoing antenatal corticosteroids is probably wise. I discussed these recommendations with the patient as well as discussing the importance of remaining in the hospital to get her blood sugars under better control prior to delivery  I spent 15 minutes with the patient for this consult, >50% of the time spent in counseling

## 2017-04-28 DIAGNOSIS — O329XX0 Maternal care for malpresentation of fetus, unspecified, not applicable or unspecified: Secondary | ICD-10-CM

## 2017-04-28 DIAGNOSIS — Z3A36 36 weeks gestation of pregnancy: Secondary | ICD-10-CM

## 2017-04-28 DIAGNOSIS — O24013 Pre-existing diabetes mellitus, type 1, in pregnancy, third trimester: Secondary | ICD-10-CM | POA: Diagnosis not present

## 2017-04-28 LAB — CBC
HCT: 39 % (ref 36.0–46.0)
Hemoglobin: 13 g/dL (ref 12.0–15.0)
MCH: 28.3 pg (ref 26.0–34.0)
MCHC: 33.3 g/dL (ref 30.0–36.0)
MCV: 85 fL (ref 78.0–100.0)
PLATELETS: 247 10*3/uL (ref 150–400)
RBC: 4.59 MIL/uL (ref 3.87–5.11)
RDW: 14.1 % (ref 11.5–15.5)
WBC: 5.9 10*3/uL (ref 4.0–10.5)

## 2017-04-28 LAB — GLUCOSE, CAPILLARY
GLUCOSE-CAPILLARY: 225 mg/dL — AB (ref 65–99)
GLUCOSE-CAPILLARY: 213 mg/dL — AB (ref 65–99)
Glucose-Capillary: 160 mg/dL — ABNORMAL HIGH (ref 65–99)
Glucose-Capillary: 172 mg/dL — ABNORMAL HIGH (ref 65–99)
Glucose-Capillary: 222 mg/dL — ABNORMAL HIGH (ref 65–99)

## 2017-04-28 LAB — TYPE AND SCREEN
ABO/RH(D): B NEG
ANTIBODY SCREEN: NEGATIVE

## 2017-04-28 MED ORDER — INSULIN ASPART 100 UNIT/ML ~~LOC~~ SOLN
27.0000 [IU] | Freq: Three times a day (TID) | SUBCUTANEOUS | Status: DC
  Administered 2017-04-28 (×3): 27 [IU] via SUBCUTANEOUS

## 2017-04-28 MED ORDER — DEXTROSE IN LACTATED RINGERS 5 % IV SOLN
INTRAVENOUS | Status: DC
  Administered 2017-04-29: 07:00:00 via INTRAVENOUS

## 2017-04-28 MED ORDER — INSULIN NPH (HUMAN) (ISOPHANE) 100 UNIT/ML ~~LOC~~ SUSP
25.0000 [IU] | Freq: Two times a day (BID) | SUBCUTANEOUS | Status: DC
  Administered 2017-04-28 – 2017-04-29 (×2): 25 [IU] via SUBCUTANEOUS

## 2017-04-28 MED ORDER — SODIUM CHLORIDE 0.9 % IV SOLN: INTRAVENOUS | Status: DC

## 2017-04-28 MED ORDER — SODIUM CHLORIDE 0.9 % IV SOLN
INTRAVENOUS | Status: DC
  Administered 2017-04-29: 0.7 [IU]/h via INTRAVENOUS
  Filled 2017-04-28: qty 1

## 2017-04-28 NOTE — Progress Notes (Signed)
Patient ID: Kathryn Mayo, female   DOB: 08/08/1986, 31 y.o.   MRN: [id number] FACULTY PRACTICE ANTEPARTUM(COMPREHENSIVE) NOTE  Kathryn Mayo is a 31 y.o. [id number] at 3269w5d by best clinical estimate who is admitted for poorly controlled diabetes.   Fetal presentation is breech. Length of Stay:  7  Days  Subjective: Patient without complaint Patient reports the fetal movement as active. Patient reports uterine contraction  activity as none. Patient reports  vaginal bleeding as none. Patient describes fluid per vagina as None.  Vitals:  Blood pressure 122/67, pulse 86, temperature 98.3 F (36.8 C), temperature source Oral, resp. rate 18, height 4\' 11"  (1.499 m), weight 227 lb 0.6 oz (103 kg), last menstrual period 08/14/2016, SpO2 98 %, unknown if currently breastfeeding. Physical Examination:  General appearance - alert, well appearing, and in no distress Heart: regular rate, no murmur Lungs: clear to auscultation bilaterally, no wheezing. Abdomen - gravid, NT Fundal Height:  size equals dates Extremities: No edema. Pulses intact. Membranes:intact  Fetal Monitoring:  Baseline: 140 bpm, Variability: Good {> 6 bpm), Accelerations: Reactive and Decelerations: Absent  Labs:  Results for orders placed or performed during the hospital encounter of 04/21/17 (from the past 24 hour(s))  Glucose, capillary   Collection Time: 04/27/17  6:24 AM  Result Value Ref Range   Glucose-Capillary 152 (H) 65 - 99 mg/dL  Glucose, capillary   Collection Time: 04/27/17 10:23 AM  Result Value Ref Range   Glucose-Capillary 199 (H) 65 - 99 mg/dL  Glucose, capillary   Collection Time: 04/27/17  3:49 PM  Result Value Ref Range   Glucose-Capillary 195 (H) 65 - 99 mg/dL  Glucose, capillary   Collection Time: 04/28/17 12:03 AM  Result Value Ref Range   Glucose-Capillary 232 (H) 65 - 99 mg/dL  Glucose, capillary   Collection Time: 04/28/17  3:20 AM  Result Value Ref Range   Glucose-Capillary 222 (H) 65  - 99 mg/dL   Comment 1 Notify RN   Glucose, capillary   Collection Time: 04/28/17  5:54 AM  Result Value Ref Range   Glucose-Capillary 225 (H) 65 - 99 mg/dL   Comment 1 Notify RN     Medications:  Scheduled . diphenhydrAMINE  25 mg Oral TID  . hydrocortisone cream   Topical QID  . insulin aspart  0-32 Units Subcutaneous TID PC & HS  . insulin aspart  27 Units Subcutaneous TID WC  . insulin NPH Human  25 Units Subcutaneous BID AC & HS  . insulin regular  0-10 Units Intravenous TID AC  . metFORMIN  500 mg Oral BID WC  . prenatal multivitamin  1 tablet Oral Q1200  . sodium chloride flush  3 mL Intravenous Q12H   I have reviewed the patient's current medications.  ASSESSMENT: Active Problems:   DM2 (diabetes mellitus, type 2) (HCC)   Malpresentation before onset of labor Previous C-section x 2 Poorly controlled diabetes Local site reaction to insulin  PLAN: 1. DM2  CBGs Globally high  Increase insulin NPH to 25u BID  Increase Aspart premeal to 27.  Reactive NST x3 reviewed  Site rxn to insulin likely due to pH. Had offered insulin drip, which patient initially agreed to, but refused with nurses. 2. Malpresentation  Cesarean at 37 wks 3. Prior cesarean section   Levie HeritageJacob J Bodin Gorka, DO 04/28/2017,6:04 AM

## 2017-04-28 NOTE — Progress Notes (Signed)
FACULTY PRACTICE ANTEPARTUM(COMPREHENSIVE) NOTE  Kathryn Mayo is a 31 y.o. [id number] at [redacted]w[redacted]d b who is admitted for uncontrolled DM, scheduled for repeat cesarean on Friday morning.  pt is informed of plans for Glucose stabilizer beginning at 6 a.m. Pt accepts the need for fine tuning cbg control before the delivery.  Fetal presentation is cephalic. Length of Stay:  7  Days  Subjective:  Patient reports the fetal movement as active. Patient reports uterine contraction  activity as none. Patient reports  vaginal bleeding as none. Patient describes fluid per vagina as None.  Vitals:  Blood pressure 133/66, pulse 86, temperature 98.9 F (37.2 C), temperature source Oral, resp. rate 18, height 4\' 11"  (1.499 m), weight 227 lb 0.6 oz (103 kg), last menstrual period 08/14/2016, SpO2 98 %, unknown if currently breastfeeding. Physical Examination:  General appearance - alert, well appearing, and in no distress Heart - normal rate and regular rhythm Abdomen - soft, nontender, nondistended Fundal Height:  size greater than dates Cervical Exam: Not evaluated. and found t Fetal Monitoring:    Labs:  Results for orders placed or performed during the hospital encounter of 04/21/17 (from the past 24 hour(s))  Glucose, capillary   Collection Time: 04/28/17 12:03 AM  Result Value Ref Range   Glucose-Capillary 232 (H) 65 - 99 mg/dL  Glucose, capillary   Collection Time: 04/28/17  3:20 AM  Result Value Ref Range   Glucose-Capillary 222 (H) 65 - 99 mg/dL   Comment 1 Notify RN   Glucose, capillary   Collection Time: 04/28/17  5:54 AM  Result Value Ref Range   Glucose-Capillary 225 (H) 65 - 99 mg/dL   Comment 1 Notify RN   CBC   Collection Time: 04/28/17  6:27 AM  Result Value Ref Range   WBC 5.9 4.0 - 10.5 K/uL   RBC 4.59 3.87 - 5.11 MIL/uL   Hemoglobin 13.0 12.0 - 15.0 g/dL   HCT 45.4 09.8 - 11.9 %   MCV 85.0 78.0 - 100.0 fL   MCH 28.3 26.0 - 34.0 pg   MCHC 33.3 30.0 - 36.0 g/dL   RDW  14.7 82.9 - 56.2 %   Platelets 247 150 - 400 K/uL  Type and screen [facility] [place]   Collection Time: 04/28/17  6:27 AM  Result Value Ref Range   ABO/RH(D) B NEG    Antibody Screen NEG    Sample Expiration 05/01/2017   Glucose, capillary   Collection Time: 04/28/17 11:44 AM  Result Value Ref Range   Glucose-Capillary 160 (H) 65 - 99 mg/dL   Comment 1 Notify RN    Comment 2 Document in Chart   Glucose, capillary   Collection Time: 04/28/17  6:18 PM  Result Value Ref Range   Glucose-Capillary 213 (H) 65 - 99 mg/dL    Imaging Studies:     Currently EPIC will not allow sonographic studies to automatically populate into notes.  In the meantime, copy and paste results into note or free text.  Medications:  Scheduled . diphenhydrAMINE  25 mg Oral TID  . hydrocortisone cream   Topical QID  . insulin aspart  0-32 Units Subcutaneous TID PC & HS  . insulin aspart  27 Units Subcutaneous TID WC  . insulin NPH Human  25 Units Subcutaneous BID AC & HS  . metFORMIN  500 mg Oral BID WC  . prenatal multivitamin  1 tablet Oral Q1200  . sodium chloride flush  3 mL Intravenous Q12H  I have reviewed the patient's current medications.  ASSESSMENT: Patient Active Problem List   Diagnosis Date Noted  . Malpresentation before onset of labor 04/25/2017  . History of cesarean delivery, currently pregnant 04/21/2017  . DM2 (diabetes mellitus, type 2) (HCC) 04/21/2017  . Polyhydramnios 03/05/2017  . Allergy to insulin 02/02/2017  . Prior pregnancy with fetal demise 01/26/2017  . Rh negative, antepartum 12/21/2016  . Smoker 11/23/2016  . Depression affecting pregnancy 10/27/2016  . PTSD (post-traumatic stress disorder) 10/27/2016  . Obesity affecting pregnancy 10/27/2016  . Type 2 diabetes mellitus affecting pregnancy, antepartum 10/27/2016  . Poor dentition 10/27/2016  . Supervision of high-risk pregnancy 10/26/2016  . History of miscarriage 05/18/2013    PLAN: Begin Glucose  stabilizer at 6:00    [name] V 04/28/2017,8:02 PM    Patient ID: Kathryn Mayo, female   DOB: 05/03/1986, 31 y.o.   MRN: [id number]

## 2017-04-29 ENCOUNTER — Encounter (HOSPITAL_COMMUNITY): Payer: Medicare Other

## 2017-04-29 LAB — GLUCOSE, CAPILLARY
GLUCOSE-CAPILLARY: 160 mg/dL — AB (ref 65–99)
GLUCOSE-CAPILLARY: 134 mg/dL — AB (ref 65–99)
GLUCOSE-CAPILLARY: 152 mg/dL — AB (ref 65–99)
GLUCOSE-CAPILLARY: 175 mg/dL — AB (ref 65–99)
Glucose-Capillary: 158 mg/dL — ABNORMAL HIGH (ref 65–99)
Glucose-Capillary: 204 mg/dL — ABNORMAL HIGH (ref 65–99)
Glucose-Capillary: 188 mg/dL — ABNORMAL HIGH (ref 65–99)
Glucose-Capillary: 210 mg/dL — ABNORMAL HIGH (ref 65–99)

## 2017-04-29 MED ORDER — INSULIN ASPART 100 UNIT/ML ~~LOC~~ SOLN
4.0000 [IU] | Freq: Once | SUBCUTANEOUS | Status: AC
  Administered 2017-04-29: 4 [IU] via SUBCUTANEOUS

## 2017-04-29 MED ORDER — INSULIN ASPART 100 UNIT/ML ~~LOC~~ SOLN
8.0000 [IU] | Freq: Every day | SUBCUTANEOUS | Status: DC
  Administered 2017-04-29: 8 [IU] via SUBCUTANEOUS

## 2017-04-29 MED ORDER — INSULIN NPH (HUMAN) (ISOPHANE) 100 UNIT/ML ~~LOC~~ SUSP: 25.0000 [IU] | Freq: Every day | SUBCUTANEOUS | Status: DC

## 2017-04-29 MED ORDER — LACTATED RINGERS IV SOLN
INTRAVENOUS | Status: DC
  Administered 2017-04-30: 07:00:00 via INTRAVENOUS

## 2017-04-29 MED ORDER — INSULIN ASPART 100 UNIT/ML ~~LOC~~ SOLN
8.0000 [IU] | Freq: Three times a day (TID) | SUBCUTANEOUS | Status: DC
  Administered 2017-04-29: 8 [IU] via SUBCUTANEOUS

## 2017-04-29 MED ORDER — INSULIN REGULAR BOLUS VIA INFUSION
0.0000 [IU] | Freq: Three times a day (TID) | INTRAVENOUS | Status: DC
  Filled 2017-04-29: qty 10

## 2017-04-29 MED ORDER — INSULIN NPH (HUMAN) (ISOPHANE) 100 UNIT/ML ~~LOC~~ SUSP
25.0000 [IU] | Freq: Every day | SUBCUTANEOUS | Status: DC
  Administered 2017-04-29: 25 [IU] via SUBCUTANEOUS

## 2017-04-29 NOTE — Progress Notes (Signed)
Matt Zeitler notified, order received to start IV in am before induction

## 2017-04-29 NOTE — Progress Notes (Signed)
Called to patient's room. Pt sitting in chair and tearful. Pt states that she does not want to continue with the insulin drip and hourly blood sugar monitoring. Pt states that she has not slept and every time she falls asleep she is being awakened for finger stick. Dr. Erin FullingHarraway-Smith notified and will be up to speak to patient. Carmelina DaneERRI L Leemon Ayala, RN

## 2017-04-29 NOTE — Progress Notes (Signed)
Pt refusing 1pm fingerstick. Dr. Erin FullingHarraway-Smith aware. Insulin drip discontinued per Dr. Erin FullingHarraway-Smith. Awaiting new orders. Carmelina DaneERRI L Reyes [place]aco, RN

## 2017-04-29 NOTE — Progress Notes (Signed)
Pt refusing to be put on fetal monitoring at this time. Pt states that she wanted to eat breakfast first. Kathryn DaneERRI L Shermaine Rivet, RN

## 2017-04-29 NOTE — Progress Notes (Signed)
CBG 2 hours post prandial 210. Dr Macon LargeAnyanwu notified order received, give extra 4 units of Novoloq now

## 2017-04-29 NOTE — Care Management Important Message (Signed)
Important Message  Patient Details  Name: Kathryn Mayo MRN: [id number] Date of Birth: 02/03/1986   Medicare Important Message Given:  Yes    Renie OraHawkins, Larrissa Stivers Smith 04/29/2017, 5:04 PM

## 2017-04-29 NOTE — Progress Notes (Signed)
FACULTY PRACTICE ANTEPARTUM(COMPREHENSIVE) NOTE  Kathryn Mayo is a 31 y.o. [id number] at 6971w6d  who is admitted for diabetic control improvement prior to scheduled cesarean section onset Friday.  She has a prior cesarean 2 Fetal presentation is breech. Length of Stay:  8  Days  Subjective: Today the plan is to place her on glucose stabilizer in order to keep blood sugar results today 100 to120 so that the baby will not be as likely to have reactive hypoglycemia after her C-section tomorrow. The patient does not desire permanent sterilization she is unsure of her future pregnancy plans, her current husband is married to her only couple months  Patient reports the fetal movement as active. Patient reports uterine contraction  activity as none. Patient reports  vaginal bleeding as none. Patient describes fluid per vagina as None.  Vitals:  Blood pressure 116/63, pulse 85, temperature 97.9 F (36.6 C), temperature source Oral, resp. rate 18, height 4\' 11"  (1.499 m), weight 103 kg (227 lb 0.6 oz), last menstrual period 08/14/2016, SpO2 100 %, unknown if currently breastfeeding. Physical Examination:  General appearance - alert, well appearing, and in no distress Heart - normal rate and regular rhythm Abdomen - soft, nontender, nondistended Fundal Height:  size equals dates Cervical Exam: Not evaluated. andd fetal presentation is breech. Extremities: extremities normal, atraumatic, no cyanosis or edema and Homans sign is negative, no sign of DVT with DTRs 2+ bilaterally Membranes:intact  Fetal Monitoring:  Fetal; monitoring bid being done.  Labs:  Results for orders placed or performed during the hospital encounter of 04/21/17 (from the past 24 hour(s))  Glucose, capillary   Collection Time: 04/28/17 11:44 AM  Result Value Ref Range   Glucose-Capillary 160 (H) 65 - 99 mg/dL   Comment 1 Notify RN    Comment 2 Document in Chart   Glucose, capillary   Collection Time: 04/28/17  6:18 PM   Result Value Ref Range   Glucose-Capillary 213 (H) 65 - 99 mg/dL  Glucose, capillary   Collection Time: 04/28/17 11:40 PM  Result Value Ref Range   Glucose-Capillary 172 (H) 65 - 99 mg/dL  Glucose, capillary   Collection Time: 04/29/17  4:16 AM  Result Value Ref Range   Glucose-Capillary 160 (H) 65 - 99 mg/dL  Glucose, capillary   Collection Time: 04/29/17  6:47 AM  Result Value Ref Range   Glucose-Capillary 134 (H) 65 - 99 mg/dL  Glucose, capillary   Collection Time: 04/29/17  7:58 AM  Result Value Ref Range   Glucose-Capillary 152 (H) 65 - 99 mg/dL   Comment 1 Notify RN    Comment 2 Document in Chart   Glucose, capillary   Collection Time: 04/29/17  9:01 AM  Result Value Ref Range   Glucose-Capillary 158 (H) 65 - 99 mg/dL    Imaging Studies:     Currently EPIC will not allow sonographic studies to automatically populate into notes.  In the meantime, copy and paste results into note or free text.  Medications:  Scheduled . diphenhydrAMINE  25 mg Oral TID  . hydrocortisone cream   Topical QID  . insulin aspart  0-32 Units Subcutaneous TID PC & HS  . insulin aspart  27 Units Subcutaneous TID WC  . insulin NPH Human  25 Units Subcutaneous BID AC & HS  . metFORMIN  500 mg Oral BID WC  . prenatal multivitamin  1 tablet Oral Q1200  . sodium chloride flush  3 mL Intravenous Q12H   I have reviewed  the patient's current medications. We are now converting to the glucose stabilizer stopping all her other instruments so that we can manage on the glucose stabilizer's eyes are all day  ASSESSMENT: Patient Active Problem List   Diagnosis Date Noted  . Malpresentation before onset of labor 04/25/2017  . History of cesarean delivery, currently pregnant 04/21/2017  . DM2 (diabetes mellitus, type 2) (HCC) 04/21/2017  . Polyhydramnios 03/05/2017  . Allergy to insulin 02/02/2017  . Prior pregnancy with fetal demise 01/26/2017  . Rh negative, antepartum 12/21/2016  . Smoker  11/23/2016  . Depression affecting pregnancy 10/27/2016  . PTSD (post-traumatic stress disorder) 10/27/2016  . Obesity affecting pregnancy 10/27/2016  . Type 2 diabetes mellitus affecting pregnancy, antepartum 10/27/2016  . Poor dentition 10/27/2016  . Supervision of high-risk pregnancy 10/26/2016  . History of miscarriage 05/18/2013    PLAN: We are placing patient on glucose stabilizer today in order to manage her insulin some get the blood sugars down to ideal levels 100 120 on the day before surgery  Kathryn Mayo 04/29/2017,9:20 AM

## 2017-04-30 ENCOUNTER — Inpatient Hospital Stay (HOSPITAL_COMMUNITY): Payer: Medicare Other | Admitting: Certified Registered Nurse Anesthetist

## 2017-04-30 ENCOUNTER — Encounter (HOSPITAL_COMMUNITY)
Admission: AD | Disposition: A | Discharge status: Home / Self Care | Payer: Self-pay | Source: Ambulatory Visit | Attending: Obstetrics and Gynecology

## 2017-04-30 ENCOUNTER — Encounter: Payer: Self-pay | Admitting: Obstetrics and Gynecology

## 2017-04-30 ENCOUNTER — Ambulatory Visit (HOSPITAL_COMMUNITY): Admission: RE | Admit: 2017-04-30 | Payer: Medicare Other | Source: Ambulatory Visit

## 2017-04-30 ENCOUNTER — Encounter (HOSPITAL_COMMUNITY): Payer: Self-pay

## 2017-04-30 DIAGNOSIS — E119 Type 2 diabetes mellitus without complications: Secondary | ICD-10-CM

## 2017-04-30 DIAGNOSIS — O3663X0 Maternal care for excessive fetal growth, third trimester, not applicable or unspecified: Secondary | ICD-10-CM

## 2017-04-30 DIAGNOSIS — Z3A37 37 weeks gestation of pregnancy: Secondary | ICD-10-CM

## 2017-04-30 DIAGNOSIS — O321XX0 Maternal care for breech presentation, not applicable or unspecified: Secondary | ICD-10-CM

## 2017-04-30 DIAGNOSIS — O2412 Pre-existing diabetes mellitus, type 2, in childbirth: Secondary | ICD-10-CM

## 2017-04-30 DIAGNOSIS — O403XX0 Polyhydramnios, third trimester, not applicable or unspecified: Secondary | ICD-10-CM

## 2017-04-30 DIAGNOSIS — O34211 Maternal care for low transverse scar from previous cesarean delivery: Secondary | ICD-10-CM

## 2017-04-30 LAB — CBC
HCT: 37.8 % (ref 36.0–46.0)
HEMOGLOBIN: 12.9 g/dL (ref 12.0–15.0)
MCH: 28.8 pg (ref 26.0–34.0)
MCHC: 34.1 g/dL (ref 30.0–36.0)
MCV: 84.4 fL (ref 78.0–100.0)
PLATELETS: 209 10*3/uL (ref 150–400)
RBC: 4.48 MIL/uL (ref 3.87–5.11)
RDW: 14.3 % (ref 11.5–15.5)
WBC: 5.7 10*3/uL (ref 4.0–10.5)

## 2017-04-30 LAB — GLUCOSE, CAPILLARY
GLUCOSE-CAPILLARY: 190 mg/dL — AB (ref 65–99)
GLUCOSE-CAPILLARY: 180 mg/dL — AB (ref 65–99)
GLUCOSE-CAPILLARY: 197 mg/dL — AB (ref 65–99)
Glucose-Capillary: 164 mg/dL — ABNORMAL HIGH (ref 65–99)

## 2017-04-30 SURGERY — Surgical Case
Anesthesia: Spinal

## 2017-04-30 MED ORDER — OXYTOCIN 10 UNIT/ML IJ SOLN
INTRAVENOUS | Status: DC | PRN
  Administered 2017-04-30: 40 [IU] via INTRAVENOUS

## 2017-04-30 MED ORDER — DIBUCAINE 1 % RE OINT: 1.0000 "application " | TOPICAL_OINTMENT | RECTAL | Status: DC | PRN

## 2017-04-30 MED ORDER — CEFAZOLIN SODIUM-DEXTROSE 2-4 GM/100ML-% IV SOLN
INTRAVENOUS | Status: AC
  Filled 2017-04-30: qty 100

## 2017-04-30 MED ORDER — MORPHINE SULFATE (PF) 0.5 MG/ML IJ SOLN
INTRAMUSCULAR | Status: DC | PRN
  Administered 2017-04-30: .1 mg via INTRATHECAL

## 2017-04-30 MED ORDER — BUPIVACAINE IN DEXTROSE 0.75-8.25 % IT SOLN
INTRATHECAL | Status: DC | PRN
  Administered 2017-04-30: 1.4 mL via INTRATHECAL

## 2017-04-30 MED ORDER — ACETAMINOPHEN 500 MG PO TABS
1000.0000 mg | ORAL_TABLET | Freq: Four times a day (QID) | ORAL | Status: AC
  Administered 2017-04-30 – 2017-05-01 (×2): 1000 mg via ORAL
  Filled 2017-04-30 (×3): qty 2

## 2017-04-30 MED ORDER — OXYCODONE HCL 5 MG PO TABS
5.0000 mg | ORAL_TABLET | ORAL | Status: DC | PRN
  Administered 2017-05-02 (×2): 5 mg via ORAL
  Filled 2017-04-30 (×2): qty 1

## 2017-04-30 MED ORDER — MEASLES, MUMPS & RUBELLA VAC ~~LOC~~ INJ
0.5000 mL | INJECTION | Freq: Once | SUBCUTANEOUS | Status: DC
  Filled 2017-04-30: qty 0.5

## 2017-04-30 MED ORDER — PROMETHAZINE HCL 25 MG/ML IJ SOLN: 6.2500 mg | INTRAMUSCULAR | Status: DC | PRN

## 2017-04-30 MED ORDER — LACTATED RINGERS IV SOLN: INTRAVENOUS | Status: DC

## 2017-04-30 MED ORDER — SENNOSIDES-DOCUSATE SODIUM 8.6-50 MG PO TABS
2.0000 | ORAL_TABLET | ORAL | Status: DC
  Administered 2017-05-01 – 2017-05-02 (×3): 2 via ORAL
  Filled 2017-04-30 (×3): qty 2

## 2017-04-30 MED ORDER — NALBUPHINE HCL 10 MG/ML IJ SOLN: 5.0000 mg | Freq: Once | INTRAMUSCULAR | Status: DC | PRN

## 2017-04-30 MED ORDER — LACTATED RINGERS IV SOLN
INTRAVENOUS | Status: DC | PRN
  Administered 2017-04-30: 09:00:00 via INTRAVENOUS

## 2017-04-30 MED ORDER — SODIUM CHLORIDE 0.9% FLUSH: 3.0000 mL | INTRAVENOUS | Status: DC | PRN

## 2017-04-30 MED ORDER — HYDROMORPHONE HCL 1 MG/ML IJ SOLN: 0.2500 mg | INTRAMUSCULAR | Status: DC | PRN

## 2017-04-30 MED ORDER — ONDANSETRON HCL 4 MG/2ML IJ SOLN
INTRAMUSCULAR | Status: AC
  Filled 2017-04-30: qty 2

## 2017-04-30 MED ORDER — ZOLPIDEM TARTRATE 5 MG PO TABS
5.0000 mg | ORAL_TABLET | Freq: Every evening | ORAL | Status: DC | PRN
Start: 2017-04-30 — End: 2017-05-03

## 2017-04-30 MED ORDER — DEXTROSE 5 % IV SOLN
1.0000 ug/kg/h | INTRAVENOUS | Status: DC | PRN
  Filled 2017-04-30: qty 2

## 2017-04-30 MED ORDER — MEPERIDINE HCL 25 MG/ML IJ SOLN: 6.2500 mg | INTRAMUSCULAR | Status: DC | PRN

## 2017-04-30 MED ORDER — DIPHENHYDRAMINE HCL 25 MG PO CAPS: 25.0000 mg | ORAL_CAPSULE | Freq: Four times a day (QID) | ORAL | Status: DC | PRN

## 2017-04-30 MED ORDER — BUPIVACAINE HCL (PF) 0.25 % IJ SOLN
INTRAMUSCULAR | Status: AC
  Filled 2017-04-30: qty 30

## 2017-04-30 MED ORDER — FENTANYL CITRATE (PF) 100 MCG/2ML IJ SOLN
INTRAMUSCULAR | Status: AC
  Filled 2017-04-30: qty 2

## 2017-04-30 MED ORDER — DIPHENHYDRAMINE HCL 25 MG PO CAPS
25.0000 mg | ORAL_CAPSULE | ORAL | Status: DC | PRN
  Filled 2017-04-30: qty 1

## 2017-04-30 MED ORDER — OXYTOCIN 40 UNITS IN LACTATED RINGERS INFUSION - SIMPLE MED: 2.5000 [IU]/h | INTRAVENOUS | Status: AC

## 2017-04-30 MED ORDER — METFORMIN HCL 500 MG PO TABS
1000.0000 mg | ORAL_TABLET | Freq: Two times a day (BID) | ORAL | Status: DC
  Administered 2017-04-30 – 2017-05-03 (×6): 1000 mg via ORAL
  Filled 2017-04-30 (×8): qty 2

## 2017-04-30 MED ORDER — ONDANSETRON HCL 4 MG/2ML IJ SOLN: 4.0000 mg | Freq: Three times a day (TID) | INTRAMUSCULAR | Status: DC | PRN

## 2017-04-30 MED ORDER — SIMETHICONE 80 MG PO CHEW
80.0000 mg | CHEWABLE_TABLET | Freq: Three times a day (TID) | ORAL | Status: DC
  Administered 2017-04-30 – 2017-05-03 (×8): 80 mg via ORAL
  Filled 2017-04-30 (×8): qty 1

## 2017-04-30 MED ORDER — COCONUT OIL OIL: 1.0000 "application " | TOPICAL_OIL | Status: DC | PRN

## 2017-04-30 MED ORDER — MENTHOL 3 MG MT LOZG: 1.0000 | LOZENGE | OROMUCOSAL | Status: DC | PRN

## 2017-04-30 MED ORDER — INSULIN ASPART 100 UNIT/ML ~~LOC~~ SOLN
5.0000 [IU] | Freq: Once | SUBCUTANEOUS | Status: AC
  Administered 2017-04-30: 5 [IU] via SUBCUTANEOUS

## 2017-04-30 MED ORDER — KETOROLAC TROMETHAMINE 30 MG/ML IJ SOLN
30.0000 mg | Freq: Once | INTRAMUSCULAR | Status: DC | PRN
  Administered 2017-04-30: 30 mg via INTRAVENOUS

## 2017-04-30 MED ORDER — WITCH HAZEL-GLYCERIN EX PADS: 1.0000 "application " | MEDICATED_PAD | CUTANEOUS | Status: DC | PRN

## 2017-04-30 MED ORDER — ONDANSETRON HCL 4 MG/2ML IJ SOLN
INTRAMUSCULAR | Status: DC | PRN
Start: 2017-04-30 — End: 2017-04-30
  Administered 2017-04-30: 4 mg via INTRAVENOUS

## 2017-04-30 MED ORDER — SCOPOLAMINE 1 MG/3DAYS TD PT72
MEDICATED_PATCH | TRANSDERMAL | Status: AC
  Filled 2017-04-30: qty 1

## 2017-04-30 MED ORDER — OXYTOCIN 10 UNIT/ML IJ SOLN
INTRAMUSCULAR | Status: AC
  Filled 2017-04-30: qty 4

## 2017-04-30 MED ORDER — SIMETHICONE 80 MG PO CHEW
80.0000 mg | CHEWABLE_TABLET | ORAL | Status: DC
  Administered 2017-05-01 – 2017-05-02 (×3): 80 mg via ORAL
  Filled 2017-04-30 (×2): qty 1

## 2017-04-30 MED ORDER — OXYCODONE HCL 5 MG PO TABS
10.0000 mg | ORAL_TABLET | ORAL | Status: DC | PRN
  Administered 2017-05-01 – 2017-05-03 (×9): 10 mg via ORAL
  Filled 2017-04-30 (×9): qty 2

## 2017-04-30 MED ORDER — LACTATED RINGERS IV SOLN
INTRAVENOUS | Status: DC | PRN
  Administered 2017-04-30: 08:00:00 via INTRAVENOUS

## 2017-04-30 MED ORDER — ACETAMINOPHEN 325 MG PO TABS
650.0000 mg | ORAL_TABLET | ORAL | Status: DC | PRN
  Administered 2017-05-01 – 2017-05-03 (×7): 650 mg via ORAL
  Filled 2017-04-30 (×7): qty 2

## 2017-04-30 MED ORDER — CEFAZOLIN SODIUM-DEXTROSE 2-4 GM/100ML-% IV SOLN
2.0000 g | INTRAVENOUS | Status: AC
  Administered 2017-04-30: 2 g via INTRAVENOUS
  Filled 2017-04-30: qty 100

## 2017-04-30 MED ORDER — NALBUPHINE HCL 10 MG/ML IJ SOLN: 5.0000 mg | INTRAMUSCULAR | Status: DC | PRN

## 2017-04-30 MED ORDER — FENTANYL CITRATE (PF) 100 MCG/2ML IJ SOLN
INTRAMUSCULAR | Status: DC | PRN
  Administered 2017-04-30: 10 ug via INTRATHECAL

## 2017-04-30 MED ORDER — SCOPOLAMINE 1 MG/3DAYS TD PT72
1.0000 | MEDICATED_PATCH | Freq: Once | TRANSDERMAL | Status: DC
  Filled 2017-04-30: qty 1

## 2017-04-30 MED ORDER — IBUPROFEN 600 MG PO TABS
600.0000 mg | ORAL_TABLET | Freq: Four times a day (QID) | ORAL | Status: DC
  Administered 2017-04-30 – 2017-05-03 (×12): 600 mg via ORAL
  Filled 2017-04-30 (×12): qty 1

## 2017-04-30 MED ORDER — NALBUPHINE HCL 10 MG/ML IJ SOLN
5.0000 mg | INTRAMUSCULAR | Status: DC | PRN
  Administered 2017-04-30: 5 mg via INTRAVENOUS

## 2017-04-30 MED ORDER — PHENYLEPHRINE 8 MG IN D5W 100 ML (0.08MG/ML) PREMIX OPTIME
INJECTION | INTRAVENOUS | Status: DC | PRN
  Administered 2017-04-30: 60 ug/min via INTRAVENOUS

## 2017-04-30 MED ORDER — PHENYLEPHRINE 8 MG IN D5W 100 ML (0.08MG/ML) PREMIX OPTIME
INJECTION | INTRAVENOUS | Status: AC
  Filled 2017-04-30: qty 100

## 2017-04-30 MED ORDER — PRENATAL MULTIVITAMIN CH
1.0000 | ORAL_TABLET | Freq: Every day | ORAL | Status: DC
  Administered 2017-05-01 – 2017-05-03 (×3): 1 via ORAL
  Filled 2017-04-30 (×3): qty 1

## 2017-04-30 MED ORDER — TETANUS-DIPHTH-ACELL PERTUSSIS 5-2.5-18.5 LF-MCG/0.5 IM SUSP: 0.5000 mL | Freq: Once | INTRAMUSCULAR | Status: DC

## 2017-04-30 MED ORDER — KETOROLAC TROMETHAMINE 30 MG/ML IJ SOLN: 30.0000 mg | Freq: Four times a day (QID) | INTRAMUSCULAR | Status: DC | PRN

## 2017-04-30 MED ORDER — DIPHENHYDRAMINE HCL 50 MG/ML IJ SOLN
INTRAMUSCULAR | Status: AC
  Filled 2017-04-30: qty 1

## 2017-04-30 MED ORDER — BUPIVACAINE IN DEXTROSE 0.75-8.25 % IT SOLN
INTRATHECAL | Status: AC
  Filled 2017-04-30: qty 2

## 2017-04-30 MED ORDER — NALOXONE HCL 0.4 MG/ML IJ SOLN: 0.4000 mg | INTRAMUSCULAR | Status: DC | PRN

## 2017-04-30 MED ORDER — DIPHENHYDRAMINE HCL 50 MG/ML IJ SOLN
12.5000 mg | INTRAMUSCULAR | Status: DC | PRN
  Administered 2017-04-30: 12.5 mg via INTRAVENOUS

## 2017-04-30 MED ORDER — MORPHINE SULFATE (PF) 0.5 MG/ML IJ SOLN
INTRAMUSCULAR | Status: AC
  Filled 2017-04-30: qty 10

## 2017-04-30 MED ORDER — NALBUPHINE HCL 10 MG/ML IJ SOLN
5.0000 mg | Freq: Once | INTRAMUSCULAR | Status: DC | PRN
  Filled 2017-04-30: qty 1

## 2017-04-30 MED ORDER — ACETAMINOPHEN 325 MG PO TABS: 650.0000 mg | ORAL_TABLET | ORAL | Status: DC | PRN

## 2017-04-30 MED ORDER — IBUPROFEN 600 MG PO TABS: 600.0000 mg | ORAL_TABLET | Freq: Four times a day (QID) | ORAL | Status: DC | PRN

## 2017-04-30 MED ORDER — DEXTROSE IN LACTATED RINGERS 5 % IV SOLN
INTRAVENOUS | Status: DC
  Administered 2017-04-30 – 2017-05-01 (×3): via INTRAVENOUS

## 2017-04-30 MED ORDER — SIMETHICONE 80 MG PO CHEW: 80.0000 mg | CHEWABLE_TABLET | ORAL | Status: DC | PRN

## 2017-04-30 MED ORDER — KETOROLAC TROMETHAMINE 30 MG/ML IJ SOLN
INTRAMUSCULAR | Status: AC
  Filled 2017-04-30: qty 1

## 2017-04-30 SURGICAL SUPPLY — 35 items
APL SKNCLS STERI-STRIP NONHPOA (GAUZE/BANDAGES/DRESSINGS) ×1
BENZOIN TINCTURE PRP APPL 2/3 (GAUZE/BANDAGES/DRESSINGS) ×3 IMPLANT
CHLORAPREP W/TINT 26ML (MISCELLANEOUS) ×3 IMPLANT
CLAMP CORD UMBIL (MISCELLANEOUS) IMPLANT
CLOSURE STERI STRIP 1/2 X4 (GAUZE/BANDAGES/DRESSINGS) ×2 IMPLANT
CLOTH BEACON ORANGE TIMEOUT ST (SAFETY) ×3 IMPLANT
DRSG OPSITE POSTOP 4X10 (GAUZE/BANDAGES/DRESSINGS) ×3 IMPLANT
ELECT REM PT RETURN 9FT ADLT (ELECTROSURGICAL) ×3
ELECTRODE REM PT RTRN 9FT ADLT (ELECTROSURGICAL) ×1 IMPLANT
EXTRACTOR VACUUM M CUP 4 TUBE (SUCTIONS) IMPLANT
EXTRACTOR VACUUM M CUP 4' TUBE (SUCTIONS)
GLOVE BIOGEL PI IND STRL 7.0 (GLOVE) ×2 IMPLANT
GLOVE BIOGEL PI INDICATOR 7.0 (GLOVE) ×4
GLOVE ECLIPSE 7.0 STRL STRAW (GLOVE) ×6 IMPLANT
GOWN STRL REUS W/TWL LRG LVL3 (GOWN DISPOSABLE) ×6 IMPLANT
HOVERMATT SINGLE USE (MISCELLANEOUS) ×2 IMPLANT
KIT ABG SYR 3ML LUER SLIP (SYRINGE) IMPLANT
NDL HYPO 25X5/8 SAFETYGLIDE (NEEDLE) IMPLANT
NEEDLE HYPO 22GX1.5 SAFETY (NEEDLE) ×3 IMPLANT
NEEDLE HYPO 25X5/8 SAFETYGLIDE (NEEDLE) IMPLANT
NS IRRIG 1000ML POUR BTL (IV SOLUTION) ×3 IMPLANT
PACK C SECTION WH (CUSTOM PROCEDURE TRAY) ×3 IMPLANT
PAD ABD 7.5X8 STRL (GAUZE/BANDAGES/DRESSINGS) ×3 IMPLANT
PAD OB MATERNITY 4.3X12.25 (PERSONAL CARE ITEMS) ×3 IMPLANT
PENCIL SMOKE EVAC W/HOLSTER (ELECTROSURGICAL) ×3 IMPLANT
RTRCTR C-SECT PINK 25CM LRG (MISCELLANEOUS) ×3 IMPLANT
SPONGE DRAIN TRACH 4X4 STRL 2S (GAUZE/BANDAGES/DRESSINGS) ×2 IMPLANT
SPONGE GAUZE 4X4 12PLY STER LF (GAUZE/BANDAGES/DRESSINGS) ×3 IMPLANT
STRIP CLOSURE SKIN 1/2X4 (GAUZE/BANDAGES/DRESSINGS) ×2 IMPLANT
SUT VIC AB 0 CTX 36 (SUTURE) ×9
SUT VIC AB 0 CTX36XBRD ANBCTRL (SUTURE) ×3 IMPLANT
SUT VIC AB 4-0 KS 27 (SUTURE) ×3 IMPLANT
SYR 30ML LL (SYRINGE) ×3 IMPLANT
TOWEL OR 17X24 6PK STRL BLUE (TOWEL DISPOSABLE) ×3 IMPLANT
TRAY FOLEY BAG SILVER LF 14FR (SET/KITS/TRAYS/PACK) ×3 IMPLANT

## 2017-04-30 NOTE — Op Note (Signed)
Preoperative Diagnosis:  IUP @ 7382w0d, Previous C-section x 2, poorly controlled DM, polyhydramnios, LGA, breech presentation  Postoperative Diagnosis:  Same  Procedure: Repeat low transverse cesarean section  Surgeon: Tinnie Gensanya Krishna Heuer, M.D.  Assistant: None  Findings: Viable female infant, APGAR (1 MIN): 8   breech presentation  Estimated blood loss: 1000 cc  Complications: None known  Specimens: Placenta to labor and delivery  Reason for procedure: Briefly, the patient is a 31 y.o. [id number] 782w0d who presents for elective repeat C-section for prev x 2 and breech. Delivery occurs at 37 weeks due to non-compliance and poor glycemic control despite inpatient management with h/o IUFD and MFM recommendations.  Procedure: Patient is a to the OR where spinal analgesia was administered. She was then placed in a supine position with left lateral tilt. She received 2 g of Ancef and SCDs were in place. A timeout was performed. She was prepped and draped in the usual sterile fashion. A Foley catheter was placed in the bladder. A knife was then used to make a Pfannenstiel incision. This incision was carried out to underlying fascia which was divided in the midline with the knife. The incision was extended laterally, sharply.  The rectus was divided in the midline.  The peritoneal cavity was entered bluntly.  Alexis retractor was placed inside the incision. The bladder was noted to be high on the uterus and a bladder flap was made. A knife was used to make a low transverse incision on the uterus. This incision was carried down to the amniotic cavity was entered. Fetus was in breech presentation and was brought up out of the incision without difficulty. Delayed cord clamping done x 1 minute. Cord was clamped x 2 and cut. Infant given to waiting nurse. Cord blood was obtained. Placenta was delivered from the uterus.  Uterus was cleaned with dry lap pads. Uterine incision closed with 0 Vicryl suture in a running  fashion.  Alexis retractor was removed from the abdomen. Peritoneal closure was done with 0 Vicryl suture.  Fascia is closed with 0 Vicryl suture in a running fashion. Subcutaneous tissue infused with 30cc 0.25% Marcaine.  Subcutaneous closure was performed with 0 Vicryl suture.  Skin closed using 3-0 Vicryl on a Keith needle.  Steri strips applied, followed by pressure dressing.  All instrument, needle and lap counts were correct x 2.  Patient was awake and taken to PACU stable.  Infant remained with mom in couplet care, stable.   Shelbie Proctoranya S PrattMD 04/30/2017 9:47 AM

## 2017-04-30 NOTE — Progress Notes (Signed)
Patient with continued bloody oozing and clots with fundal rubs. Total measured output at this point equals 130 mL in PACU.  RN will continue q15 minute fundal rubs and assess closely while in the post anesthesia period. Baylen Dea Lynder ParentsBrown Zaira Iacovelli, RN 11:37 AM 04/30/2017

## 2017-04-30 NOTE — Transfer of Care (Signed)
Immediate Anesthesia Transfer of Care Note  Patient: Kathryn Mayo  Procedure(s) Performed: Procedure(s): CESAREAN SECTION (N/A)  Patient Location: PACU  Anesthesia Type:Spinal  Level of Consciousness: awake, alert  and oriented  Airway & Oxygen Therapy: Patient Spontanous Breathing  Post-op Assessment: Report given to RN and Post -op Vital signs reviewed and stable  Post vital signs: Reviewed and stable  Last Vitals:  Vitals:   04/30/17 0308 04/30/17 0819  BP: (!) 113/52 115/66  Pulse: 89 88  Resp: 18 18  Temp: 36.8 C 36.8 C    Last Pain:  Vitals:   04/30/17 0819  TempSrc: Oral  PainSc:       Patients Stated Pain Goal: 2 (04/29/17 1940)  Complications: No apparent anesthesia complications

## 2017-04-30 NOTE — Progress Notes (Signed)
CBG 180 called to Dr Erin FullingHarraway Smith, see order.

## 2017-04-30 NOTE — Anesthesia Postprocedure Evaluation (Signed)
Anesthesia Post Note  Patient: Kathryn Mayo  Procedure(s) Performed: Procedure(s) (LRB): CESAREAN SECTION (N/A)     Patient location during evaluation: PACU Anesthesia Type: Spinal Level of consciousness: awake Pain management: pain level controlled Vital Signs Assessment: post-procedure vital signs reviewed and stable Respiratory status: spontaneous breathing Cardiovascular status: stable Postop Assessment: no headache, no backache, spinal receding, patient able to bend at knees and no signs of nausea or vomiting Anesthetic complications: no    Last Vitals:  Vitals:   04/30/17 1100 04/30/17 1115  BP: 109/62 (!) 105/94  Pulse: 69 67  Resp: 17 13  Temp:      Last Pain:  Vitals:   04/30/17 1100  TempSrc:   PainSc: 2    Pain Goal: Patients Stated Pain Goal: 2 (04/29/17 1940)               Rabab Currington JR,JOHN Susann GivensFRANKLIN

## 2017-04-30 NOTE — Lactation Note (Signed)
This note was copied from a baby's chart. Lactation Consultation Note  Patient Name: Boy Harley Altoerell Kirven WUJWJ'XToday's Date: 04/30/2017 Reason for consult: Initial assessment;Other (Comment) (Baby spitty, Mom DM Type II.)  Baby 11 hours old. Mom reports that baby has been very spitty, and while she has been putting him to breast and attempting to nurse, he has been sleepy at the breast. Offered to assist with latching, but mom has just supplemented with formula. Enc mom to put baby to breast with cues, then supplement with EBM/formula according to guidelines--which were given with review. Gave mom LPI guidelines d/t baby's early gestation with review. Enc mom to offer lots of STS. [facility]. Discussed assessment and interventions with Chloe, RN and Carma LeavenMiakita, RN.  Maternal Data Does the patient have breastfeeding experience prior to this delivery?: Yes  Feeding Feeding Type: Bottle Fed - Formula Nipple Type: Slow - flow  LATCH Score/Interventions                      Lactation Tools Discussed/Used WIC Program: Yes Pump Review: Setup, frequency, and cleaning;Milk Storage Initiated by:: JW Date initiated:: 04/30/17   Consult Status Consult Status: Follow-up Date: 05/01/17 Follow-up type: In-patient    Sherlyn HayJennifer D Ariannah Arenson 04/30/2017, 8:57 PM

## 2017-04-30 NOTE — Anesthesia Preprocedure Evaluation (Signed)
Anesthesia Evaluation  Patient identified by MRN, date of birth, ID band Patient awake    Reviewed: Allergy & Precautions, NPO status , Patient's Chart, lab work & pertinent test results  Airway Mallampati: III       Dental no notable dental hx. (+) Teeth Intact   Pulmonary Current Smoker,    Pulmonary exam normal        Cardiovascular Normal cardiovascular exam Rhythm:Regular Rate:Normal     Neuro/Psych negative neurological ROS     GI/Hepatic negative GI ROS, Neg liver ROS,   Endo/Other  diabetes, Type 2, Oral Hypoglycemic AgentsMorbid obesity  Renal/GU negative Renal ROS  negative genitourinary   Musculoskeletal negative musculoskeletal ROS (+)   Abdominal (+) + obese,   Peds  Hematology   Anesthesia Other Findings   Reproductive/Obstetrics                             Anesthesia Physical Anesthesia Plan  ASA: III  Anesthesia Plan: Spinal   Post-op Pain Management:    Induction:   PONV Risk Score and Plan: 2 and Ondansetron and Dexamethasone  Airway Management Planned:   Additional Equipment:   Intra-op Plan:   Post-operative Plan:   Informed Consent: I have reviewed the patients History and Physical, chart, labs and discussed the procedure including the risks, benefits and alternatives for the proposed anesthesia with the patient or authorized representative who has indicated his/her understanding and acceptance.     Plan Discussed with: CRNA and Surgeon  Anesthesia Plan Comments:         Anesthesia Quick Evaluation

## 2017-04-30 NOTE — Progress Notes (Signed)
[name], CNM notified of CBG 190.  No new orders.  Patient NPO for induction today.

## 2017-04-30 NOTE — Progress Notes (Signed)
Patient ID: Kathryn Mayo, female   DOB: 07/06/1986, 31 y.o.   MRN: [id number] FACULTY PRACTICE ANTEPARTUM(COMPREHENSIVE) NOTE  Kathryn Mayo is a 31 y.o. [id number] at 7043w0d by best clinical estimate who is admitted for poorly controlled diabetes.   Fetal presentation is breech. Length of Stay:  9  Days  Subjective: Refusing insulin gtt Patient reports the fetal movement as active. Patient reports uterine contraction  activity as none. Patient reports  vaginal bleeding as none. Patient describes fluid per vagina as None.  Vitals:  Blood pressure 115/66, pulse 88, temperature 98.2 F (36.8 C), temperature source Oral, resp. rate 18, height 4\' 11"  (1.499 m), weight 227 lb 0.6 oz (103 kg), last menstrual period 08/14/2016, SpO2 100 %, unknown if currently breastfeeding. Physical Examination:  General appearance - alert, well appearing, and in no distress Chest - normal effort Abdomen - gravid, NT Fundal Height:  size equals dates Extremities: Homans sign is negative, no sign of DVT  Membranes:intact  Fetal Monitoring:  Baseline: 140 bpm, Variability: Good {> 6 bpm), Accelerations: Reactive and Decelerations: Absent  Labs:  Results for orders placed or performed during the hospital encounter of 04/21/17 (from the past 24 hour(s))  Glucose, capillary   Collection Time: 04/29/17  9:01 AM  Result Value Ref Range   Glucose-Capillary 158 (H) 65 - 99 mg/dL  Glucose, capillary   Collection Time: 04/29/17 10:00 AM  Result Value Ref Range   Glucose-Capillary 175 (H) 65 - 99 mg/dL  Glucose, capillary   Collection Time: 04/29/17 10:59 AM  Result Value Ref Range   Glucose-Capillary 204 (H) 65 - 99 mg/dL  Glucose, capillary   Collection Time: 04/29/17 11:59 AM  Result Value Ref Range   Glucose-Capillary 188 (H) 65 - 99 mg/dL  Glucose, capillary   Collection Time: 04/29/17  9:09 PM  Result Value Ref Range   Glucose-Capillary 210 (H) 65 - 99 mg/dL  Glucose, capillary   Collection Time:  04/30/17  3:08 AM  Result Value Ref Range   Glucose-Capillary 190 (H) 65 - 99 mg/dL  CBC   Collection Time: 04/30/17  5:34 AM  Result Value Ref Range   WBC 5.7 4.0 - 10.5 K/uL   RBC 4.48 3.87 - 5.11 MIL/uL   Hemoglobin 12.9 12.0 - 15.0 g/dL   HCT 13.037.8 86.536.0 - 78.446.0 %   MCV 84.4 78.0 - 100.0 fL   MCH 28.8 26.0 - 34.0 pg   MCHC 34.1 30.0 - 36.0 g/dL   RDW 69.614.3 29.511.5 - 28.415.5 %   Platelets 209 150 - 400 K/uL  Glucose, capillary   Collection Time: 04/30/17  6:53 AM  Result Value Ref Range   Glucose-Capillary 180 (H) 65 - 99 mg/dL  Glucose, capillary   Collection Time: 04/30/17  8:19 AM  Result Value Ref Range   Glucose-Capillary 197 (H) 65 - 99 mg/dL    Medications:  Scheduled . diphenhydrAMINE  25 mg Oral TID  . hydrocortisone cream   Topical QID  . insulin aspart  8 Units Subcutaneous TID WC  . insulin aspart  8 Units Subcutaneous QHS  . insulin NPH Human  25 Units Subcutaneous QHS  . insulin NPH Human  25 Units Subcutaneous QAC breakfast  . prenatal multivitamin  1 tablet Oral Q1200  . sodium chloride flush  3 mL Intravenous Q12H   I have reviewed the patient's current medications.  ASSESSMENT: Active Problems:   DM2 (diabetes mellitus, type 2) (HCC)   Malpresentation before onset of labor Prior  C-section x 2 Non-compiaince with medical care Poorly controlled diabetes  PLAN: For C-section this am. Risks include but are not limited to bleeding, infection, injury to surrounding structures, including bowel, bladder and ureters, blood clots, and death.  Likelihood of success is high.   Reva Bores, MD 04/30/2017,8:22 AM

## 2017-04-30 NOTE — Interval H&P Note (Signed)
History and Physical Interval Note:  04/30/2017 8:21 AM  Kathryn Mayo  has presented today for surgery, with the diagnosis of poorly controlled diabetes and refusing insulin with prior C-section x 2 and breech presentation.  The various methods of treatment have been discussed with the patient and family. After consideration of risks, benefits and other options for treatment, the patient has consented to  Procedure(s): CESAREAN SECTION (N/A) as a surgical intervention .  The patient's history has been reviewed, patient examined, no change in status, stable for surgery.  I have reviewed the patient's chart and labs.  Questions were answered to the patient's satisfaction.     Reva Boresanya S Elah Avellino

## 2017-04-30 NOTE — H&P (View-Only) (Signed)
Obstetrics Admission History & Physical  04/21/2017 - 8:41 AM Primary OBGYN: [facility]  Chief Complaint: blood sugar control, poor patient compliance  History of Present Illness  31 y.o. [id number] @ [redacted]w[redacted]d, with the above CC. Pregnancy complicated by: DM2, poor patient compliance, BMI 45, h/o c-section x 2, Rh negative, tobacco abuse, polyhydramnios, h/o term IUFD.  Ms. ANNALYN BLECHER states that she stopped taking her metformin (1000 after breakfast and 1000 qhs) and glyburide (10/10, taken with metformin) a few weeks ago and stopped checking her BS values at that time, too, b/c she had her teeth pulled and wasn't eating as much so she thought she didn't see a need to.   Patient didn't eat anything today except coffee with splenda.   No decreased FM or PTL s/s.   Review of Systems:  as noted in the History of Present Illness.  Patient Active Problem List   Diagnosis Date Noted  . History of cesarean delivery, currently pregnant 04/21/2017  . Polyhydramnios 03/05/2017  . Allergy to insulin 02/02/2017  . Prior pregnancy with fetal demise 01/26/2017  . Rh negative, antepartum 12/21/2016  . Smoker 11/23/2016  . Depression affecting pregnancy 10/27/2016  . PTSD (post-traumatic stress disorder) 10/27/2016  . Obesity affecting pregnancy 10/27/2016  . Type 2 diabetes mellitus affecting pregnancy, antepartum 10/27/2016  . Poor dentition 10/27/2016  . Supervision of high-risk pregnancy 10/26/2016  . History of miscarriage 05/18/2013    PMHx:  Past Medical History:  Diagnosis Date  . Diabetes mellitus without complication (HCC)    2 types of oral, 2 types of insulin- no longer taking.  was old A1C is normal  . Heart murmur    when young  . Infection    uti  . Neuropathy   . Prior pregnancy with fetal demise 01/26/2017   2002 term IUFD      Guidelines for Antenatal Testing and Sonography  (with updated ICD-10 codes) INDICATION U/S NST/AFI DELIVERY Previous Stillbirth (>  28 wks) - O09.299 20-24-28-32-36 28//BPP wkly then 32//2 x wk 39    . Vaginal Pap smear, abnormal    age 62, doesn't think she has had one since   PSHx:  Past Surgical History:  Procedure Laterality Date  . CESAREAN SECTION     C/S x 2  . THERAPEUTIC ABORTION    . WISDOM TOOTH EXTRACTION     Medications: PNV, ASA 81  Allergies: is allergic to shellfish allergy. OBHx:  OB History  Gravida Para Term Preterm AB Living  8 3 3  0 4 2  SAB TAB Ectopic Multiple Live Births  2 2     2     # Outcome Date GA Lbr Len/2nd Weight Sex Delivery Anes PTL Lv  8 Current           7 SAB 2016          6 SAB 2015          5 TAB 2008          4 Term 2007    F CS-Unspec Spinal  LIV  3 Term 2005    M CS-Unspec   LIV  2 TAB 2004          1 Term 2002 [redacted]w[redacted]d   F    FD                 FHx:  Family History  Problem Relation Age of Onset  . Asthma Mother   . Drug abuse  Mother        died of overdose  . HIV/AIDS Mother        [id number]  . Asthma Daughter   . Diabetes Maternal Grandmother   . Cancer Maternal Grandmother        breast   Soc Hx:  Social History   Social History  . Marital status: Single    Spouse name: N/A  . Number of children: N/A  . Years of education: N/A   Occupational History  . Not on file.   Social History Main Topics  . Smoking status: Current Every Day Smoker    Packs/day: 0.25    Years: 10.00    Types: Cigarettes, E-cigarettes  . Smokeless tobacco: Never Used     Comment: trying to stop  . Alcohol use No     Comment: 05/05/2013  . Drug use: Yes    Types: Marijuana     Comment: last used 2 days ago 10/24/16  NONE  DURING  PREG    . Sexual activity: Yes    Birth control/ protection: None   Other Topics Concern  . Not on file   Social History Narrative  . No narrative on file    Objective  121/67 84 226lbs FHR 146  NST pending  General: Well nourished, well developed female in no acute distress.  Skin:  Warm and dry.  Respiratory:  Normal  respiratory effort Neuro/Psych:  Normal mood and affect.   Labs  CBG 330  Radiology 6/8: MVP 8.2, AFI 24, breech, BPP 8/8 6/1: 80%, 2639gm, AC >97%   Assessment & Plan   [age] y.o. [id number] @ 5313w5d with poor DM2 control, currently stable *Pregnancy: declines BTL. qshift NSTs. Needs AFI tomorrow *DM2: followed by [facility] (see care everywhere) but hasn't seen them in a few months. Pt states she has injection site reaction to various types of insulin (no SOB, difficulty with breathing). D/w her that she needs admission and possibly 37wk delivery, after d/w MFM, and possible admission until delivery. D/w her IUFD risk and need for better control. Pt amenable to admission and would like to go home first. Will do NST and if reactive, pt okay to go home. Pt told to check BS when she gets home and still high then take her AM metformin and glyburide. -DM education consulted -will do am fasting and 2hr PP BS checks and SSI ordered for now. If reaction, can just do IV *h/o prior c-section: needs scheduling when delivery timing determined after d/w MFM *Rh neg: s/p rhogam already *h/o prior IUFD: see above *GBS: ordered to be collected on admit *PPx: SCDs, OOB ad lib *FEN/GI: IV saline lock, DM2 diet.  Cornelia Copaharlie Layden Caterino, Jr. MD Attending Center [facility](Faculty Practice)

## 2017-04-30 NOTE — Progress Notes (Signed)
Pt to OR for c/s. Carmelina DaneERRI L Aniayah Alaniz, RN

## 2017-04-30 NOTE — Anesthesia Procedure Notes (Signed)
Spinal  Start time: 04/30/2017 8:46 AM End time: 04/30/2017 9:49 AM Staffing Anesthesiologist: Leilani AbleHATCHETT, Nakoa Ganus Performed: anesthesiologist  Preanesthetic Checklist Completed: patient identified, surgical consent, pre-op evaluation, timeout performed, IV checked, risks and benefits discussed and monitors and equipment checked Spinal Block Patient position: sitting Prep: site prepped and draped and DuraPrep Patient monitoring: heart rate, cardiac monitor, continuous pulse ox and blood pressure Approach: midline Location: L3-4 Injection technique: single-shot Needle Needle type: Pencan  Needle gauge: 24 G Needle length: 10 cm Needle insertion depth: 10 cm Assessment Sensory level: T6

## 2017-04-30 NOTE — Progress Notes (Signed)
Patient to PACU at [phone], no support person present. Patient requests that we attempt to locate FOB, who left this morning. Patient does not have her phone with her at this time, she left it upstairs in room 307, and it does not have service anyway.  RN called 3rd floor HROB, and was told by secretary Marchelle FolksAmanda that the FOB had been up to room approximately 30 minutes ago but had since left.  RN then called Security, who agreed to check waiting rooms for FOB, Lennie HummerWilliam Taketa. At approximately 10:24, security knocked on PACU door and brought in FOB.  Mother and FOB immediately asked if FOB could see baby. RN called nursery to inquire. However, since FOB was not present at delivery, he was not banded (both bands were placed on mother). Per nursery policy, FOB not allowed without infant band.  RN called to inquire about baby status two times, per patient and FOB request. Nursery later able to bring baby to Mother's bedside at 10:54, after providing care for baby in the nursery.  Kathryn Mayo, CaliforniaRN 04/30/2017 10:56 AM

## 2017-05-01 LAB — GLUCOSE, CAPILLARY
GLUCOSE-CAPILLARY: 215 mg/dL — AB (ref 65–99)
GLUCOSE-CAPILLARY: 208 mg/dL — AB (ref 65–99)
Glucose-Capillary: 153 mg/dL — ABNORMAL HIGH (ref 65–99)

## 2017-05-01 LAB — CBC
HCT: 33.3 % — ABNORMAL LOW (ref 36.0–46.0)
HEMOGLOBIN: 11.1 g/dL — AB (ref 12.0–15.0)
MCH: 28.5 pg (ref 26.0–34.0)
MCHC: 33.3 g/dL (ref 30.0–36.0)
MCV: 85.4 fL (ref 78.0–100.0)
Platelets: 197 10*3/uL (ref 150–400)
RBC: 3.9 MIL/uL (ref 3.87–5.11)
RDW: 14.3 % (ref 11.5–15.5)
WBC: 6.9 10*3/uL (ref 4.0–10.5)

## 2017-05-01 MED ORDER — ONDANSETRON HCL 4 MG PO TABS
4.0000 mg | ORAL_TABLET | Freq: Three times a day (TID) | ORAL | Status: DC | PRN
  Administered 2017-05-01: 4 mg via ORAL
  Filled 2017-05-01: qty 1

## 2017-05-01 MED ORDER — INSULIN ASPART 100 UNIT/ML ~~LOC~~ SOLN: 0.0000 [IU] | Freq: Three times a day (TID) | SUBCUTANEOUS | Status: DC

## 2017-05-01 MED ORDER — GLYBURIDE 5 MG PO TABS
5.0000 mg | ORAL_TABLET | Freq: Two times a day (BID) | ORAL | Status: DC
Start: 2017-05-01 — End: 2017-05-03
  Administered 2017-05-01 – 2017-05-03 (×4): 5 mg via ORAL
  Filled 2017-05-01 (×6): qty 1

## 2017-05-01 NOTE — Plan of Care (Signed)
Problem: Education: Goal: Ability to describe self-care measures that may prevent or decrease complications (Diabetes Survival Skills Education) will improve Patient questioned her diet order as carb-modified instead of regular; patient states another RN told her she could have regular diet. Discussed with patient the importance of continuing a carb-modified diet to assist in regulating blood sugars. Discussed with patient that she no longer must stay on clear liquids and may have "regular" food, but must still limit her intake. Patient verbalized understanding.   Patient has orders for blood sugars twice a day but not ordered when to obtain them. Fasting was obtained this morning. Spoke with resident, Howard PouchLauren Feng, this morning and for now it was decided to obtain fasting blood sugars and the next one in around 12 hours prior to a meal. Resident stated that the orders would be discussed in rounds and if any changed were made, they would be communicated. Discussed AC blood sugar at dinner time with patient and patient to call before she eats tonight. Earl Galasborne, Linda HedgesStefanie DushoreHudspeth

## 2017-05-01 NOTE — Addendum Note (Signed)
Addendum  created 05/01/17 0900 by Shanon PayorGregory, Najwa Spillane M, CRNA   Sign clinical note

## 2017-05-01 NOTE — Lactation Note (Signed)
This note was copied from a baby's chart. Lactation Consultation Note  Patient Name: Boy Harley Altoerell Kirven JWJXB'JToday's Date: 05/01/2017 Reason for consult: Follow-up assessment;Difficult latch   Follow up with mom of 33 hour old infant. Mom is drowsy. She reports she has pumped x 3 today and hand expressed and is not obtaining colostrum, reviewed milk coming to volume. Mom reports she tries to latch infant and he wont latch since there is nothing coming out. She reports no questions/concerns and declined LC assistance at this time. Enc her to call for assistance prn. Infant currently in the nursery.    Maternal Data Has patient been taught Hand Expression?: Yes  Feeding Feeding Type: Bottle Fed - Formula  LATCH Score/Interventions                      Lactation Tools Discussed/Used Pump Review: Setup, frequency, and cleaning Initiated by:: Reviewed and encouraged   Consult Status Consult Status: Follow-up Date: 05/02/17 Follow-up type: In-patient    Silas FloodSharon S Robertine Kipper 05/01/2017, 7:11 PM

## 2017-05-01 NOTE — Progress Notes (Signed)
CSW received consult for hx of marijuana use.  Referral was screened out due to the following: ~MOB had no documented substance use after initial prenatal visit/+UPT. ~MOB had no positive drug screens after initial prenatal visit/+UPT. ~Baby's UDS is negative.  Please consult CSW if current concerns arise or by MOB's request.  CSW will monitor CDS results and make report to Child Protective Services if warranted.  Ricky Gallery, MSW, LCSW-A Clinical Social Worker  [facility]  Office: [phone]   

## 2017-05-01 NOTE — Progress Notes (Signed)
Patient ID: Kathryn Mayo, female   DOB: 07/08/1986, 31 y.o.   MRN: [id number]  POSTPARTUM PROGRESS NOTE - LATE ENTRY  Post Op/Partum Day #1 Subjective:  Kathryn Mayo is a 31 y.o. [id number] 245w0d s/p RLTCS.  No acute events overnight.  Pt denies problems with ambulating, voiding or po intake.  She denies nausea or vomiting.  Pain is moderately controlled.  She has had flatus. She has not had bowel movement.  Lochia Small.   Objective: Blood pressure (!) 117/51, pulse 77, temperature 97.5 F (36.4 C), temperature source Oral, resp. rate 18, height 4\' 11"  (1.499 m), weight 227 lb 0.6 oz (103 kg), last menstrual period 08/14/2016, SpO2 99 %, unknown if currently breastfeeding.  Physical Exam:  General: alert, cooperative and no distress Lochia:normal flow Chest: CTAB Heart: RRR no m/r/g Abdomen: +BS, soft, nontender, nondistended; Incision, clean, dry, intact Uterine Fundus: firm, below umbilicus DVT Evaluation: No calf swelling or tenderness Extremities: Trace edema   Recent Labs  04/30/17 0534 05/01/17 0520  HGB 12.9 11.1*  HCT 37.8 33.3*    Assessment/Plan:  ASSESSMENT: Kathryn Mayo is a 31 y.o. [id number] 705w0d s/p RLTCS  #RLTCS - Routine postpartum/post operative care - Breastfeeding/pumping - Contraception: LARC Plan for discharge tomorrow or on day #3 if diabetes not improved  #GDM-B - CBG QIDAC - Metformin 1000 BID - Glyburide 5mg  BID - Goal to have BS <200 before discharge - F/U in 2 weeks for incision check   LOS: 10 days   Jen MowElizabeth Rik Wadel, DO OB Fellow Center for [facility], [facility]  05/01/2017, 11:36 AM

## 2017-05-01 NOTE — Plan of Care (Signed)
Problem: Pain Management: Goal: General experience of comfort will improve and pain level will decrease Discussed the importance of ambulating three times a day in the hallway to assist in pain control and encouraged patient to call for pain meds prior to her pain becoming out of control. Also discussed with patient the importance of limiting carbonated beverages until she begins to pass flatus regularly. Patient verbalized understanding. Earl Galasborne, Linda HedgesStefanie WatertownHudspeth

## 2017-05-01 NOTE — Anesthesia Postprocedure Evaluation (Signed)
Anesthesia Post Note  Patient: Kathryn Mayo  Procedure(s) Performed: Procedure(s) (LRB): CESAREAN SECTION (N/A)     Patient location during evaluation: Mother Baby Anesthesia Type: Spinal Level of consciousness: awake and alert and oriented Pain management: satisfactory to patient Vital Signs Assessment: post-procedure vital signs reviewed and stable Respiratory status: spontaneous breathing and nonlabored ventilation Cardiovascular status: stable Postop Assessment: no headache, no backache, patient able to bend at knees, no signs of nausea or vomiting and adequate PO intake Anesthetic complications: no    Last Vitals:  Vitals:   05/01/17 0024 05/01/17 0404  BP: (!) 106/58 (!) 106/52  Pulse: 79 68  Resp: 16 16  Temp: 36.4 C 36.6 C    Last Pain:  Vitals:   05/01/17 0506  TempSrc:   PainSc: 4    Pain Goal: Patients Stated Pain Goal: 2 (04/29/17 1940)               Madison HickmanGREGORY,Hazim Treadway

## 2017-05-01 NOTE — Progress Notes (Signed)
CSW met with MOB at bedside after completing chart review and seeing she has a hx of PPD, depression, anxiety and PTSD. Upon this writers arrival, MOB was warm and welcoming. This writer explained role and reasoning for visit. MOB was willing to discuss further. CSW inquired about MOB's above stated dx. MOB confirms having a hx of all for many years. MOB notes she experienced PPD after the birth of her daughter 11 years ago and after delivering a still born at 34 weeks. MOB additionally noted she has dealt with anxiety since a young child and it is primarily relate to being in large crowds such as a shopping mall or concert setting. CSW inquired if MOB is on medications or if she is seeing a provider. MOB notes she was on Zoloft a few years ago that was prescribed by her psychiatrist in [place]; however, stopping after a few uses due to it making her feel "like a zombie". CSW inquired if MOB desires to try other medications to help cope. MOB notes she is not as she uses her faith base to cope and her husband and children as a support. CSW inquired if MOB would be willing to take resources on PMAD vs. Baby blues, newborn checklist and list of WH activities. MOB noted she would greatly appreciate receiving both. CSW provided MOB with information/community resources. MOB reviewed and saw [facility] listed and informed this CSW that she attempted to seek services there and was told they have a three month wait list. Additionally, she noted she did not feel confident when she presented to the office as the receptionist informed her all the clinicians were on vacation. This writer encouraged MOB to contact [facility] to seek guidance as to where she can go to receive outpatient behavioral health therapy. MOB verbalized understanding and noted she would. 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   CSW has no barriers/concerns for  d/c at this time. Please contact CSW upon MOB's request or if new needs arise.   Kathryn Mayo, MSW, LCSW-A Clinical Social Worker  [facility]  Office: [phone]  

## 2017-05-02 LAB — GLUCOSE, CAPILLARY
GLUCOSE-CAPILLARY: 100 mg/dL — AB (ref 65–99)
Glucose-Capillary: 156 mg/dL — ABNORMAL HIGH (ref 65–99)
Glucose-Capillary: 163 mg/dL — ABNORMAL HIGH (ref 65–99)
Glucose-Capillary: 126 mg/dL — ABNORMAL HIGH (ref 65–99)

## 2017-05-02 NOTE — Progress Notes (Signed)
Patient ID: Kathryn Mayo, female   DOB: 10/17/1986, [age] y.o.   MRN: [id number]  POSTPARTUM PROGRESS NOTE  Post Op/Partum Day #2 Subjective:  Kathryn Mayo is a 31 y.o. [id number] 2663w0d s/p RLTCS.  No acute events overnight.  Pt denies problems with ambulating, voiding or po intake.  She denies nausea or vomiting.  Pain is well controlled.  She has had flatus. She has not had bowel movement.  Lochia Minimal.   Objective: Blood pressure 110/63, pulse 84, temperature 98.5 F (36.9 C), resp. rate 18, height 4\' 11"  (1.499 m), weight 227 lb 0.6 oz (103 kg), last menstrual period 08/14/2016, SpO2 100 %, unknown if currently breastfeeding.  Physical Exam:  General: alert, cooperative and no distress Lochia:normal flow Chest: CTAB Heart: RRR no m/r/g Abdomen: +BS, soft, nontender, nondistended Incision: clean, dry, intact Uterine Fundus: firm, below umbilicus DVT Evaluation: No calf swelling or tenderness Extremities: +1 pitting edema equally bilaterally   Recent Labs  04/30/17 0534 05/01/17 0520  HGB 12.9 11.1*  HCT 37.8 33.3*    Assessment/Plan:  ASSESSMENT: Kathryn Mayo is a 31 y.o. [id number] 3663w0d s/p RLTCS  #) Postpartum/Post Op for RLTCS - Plan for discharge tomorrow, Breastfeeding, Lactation consult and Contraception IUD  #) GDM-B, uncontrolled - Doing well on Metformin 1000BID and glyburide 5mg  BID, would recommend on d/c with these unless her BS today are become too low, then only d/c on metformin. - Monitor BS today for discharge medication planning - Had a long discussion with patient regarding her diabetes and home monitoring. She has plenty of lancets, strips, and working glucometer. She has great self-awareness of her condition and management. She has a PCP. She was previously diet controlled with HbA1c <6% prior to pregnancy. She has self-goals and motivation to return to working out after recovery from cesarean and losing weight and getting back to being controlled  through diet and exercise.    LOS: 11 days   Jen MowElizabeth Mumaw, DO OB Fellow Center [facility], [facility] 05/02/2017, 9:55 AM

## 2017-05-03 ENCOUNTER — Other Ambulatory Visit: Payer: Self-pay | Admitting: Family Medicine

## 2017-05-03 DIAGNOSIS — Z98891 History of uterine scar from previous surgery: Secondary | ICD-10-CM

## 2017-05-03 LAB — GLUCOSE, CAPILLARY
GLUCOSE-CAPILLARY: 267 mg/dL — AB (ref 65–99)
GLUCOSE-CAPILLARY: 138 mg/dL — AB (ref 65–99)

## 2017-05-03 MED ORDER — METFORMIN HCL 1000 MG PO TABS: 1000.0000 mg | ORAL_TABLET | Freq: Two times a day (BID) | ORAL | 1 refills | Status: DC

## 2017-05-03 MED ORDER — DOCUSATE SODIUM 100 MG PO CAPS: 100.0000 mg | ORAL_CAPSULE | Freq: Two times a day (BID) | ORAL | 0 refills | Status: DC

## 2017-05-03 MED ORDER — POLYETHYLENE GLYCOL 3350 17 G PO PACK
17.0000 g | PACK | Freq: Every day | ORAL | Status: DC
  Administered 2017-05-03: 17 g via ORAL
  Filled 2017-05-03 (×2): qty 1

## 2017-05-03 MED ORDER — OXYCODONE-ACETAMINOPHEN 5-325 MG PO TABS: 1.0000 | ORAL_TABLET | ORAL | 0 refills | Status: DC | PRN

## 2017-05-03 MED ORDER — GLYBURIDE 5 MG PO TABS
5.0000 mg | ORAL_TABLET | Freq: Two times a day (BID) | ORAL | 0 refills | Status: DC
Start: 2017-05-03 — End: 2018-07-22

## 2017-05-03 MED ORDER — IBUPROFEN 600 MG PO TABS: 600.0000 mg | ORAL_TABLET | Freq: Four times a day (QID) | ORAL | 0 refills | Status: DC

## 2017-05-03 MED ORDER — POLYETHYLENE GLYCOL 3350 17 G PO PACK: 17.0000 g | PACK | Freq: Every day | ORAL | 0 refills | Status: DC | PRN

## 2017-05-03 NOTE — Lactation Note (Signed)
This note was copied from a baby's chart. Lactation Consultation Note  Patient Name: Kathryn Mayo RUEAV'WToday's Date: 05/03/2017 Reason for consult: Follow-up assessment;Hyperbilirubinemia;Other (Comment) (37.0 wks. ) Baby on double photo therapy, finishing bottle when LC arrived. Mom reports offering breast with some feedings, baby will take few suckles then fall asleep. Mom reports her milk coming in, pumped 35 ml with last pumping around midnight. Breasts slight firm with some nodules outer quadrants. Advised Mom she needs to be pumping every 3 hours for 15 minutes to soften breasts well, have EBM to supplement, prevent engorgement and protect milk supply. Discussed supply/demand.  Offered to help Mom with latch at next feeding, Mom reports she interested in getting baby to breast but reports it is difficult with bili blankets. Discussed early term behaviors and encouraged Mom this will improve if she continue to offer breast.  [facility] WIC [facility].  Scheduled OP f/u with lactation to work on latch for Thursday, 05/06/17 at 0830. Encouraged Mom to offer breast with feedings, pump every 3 hours for 15 minutes. Continue to supplement with 30-45 ml each feeding To call if she would like Lactation to help with latch later today. I f she decides to pump/bottle feed then be sure she pumps at least 8 times per day for 15 minutes.   Maternal Data    Feeding Feeding Type: Formula Nipple Type: Slow - flow Length of feed:  (few sucls)  LATCH Score/Interventions                      Lactation Tools Discussed/Used Tools: Pump Breast pump type: Double-Electric Breast Pump   Consult Status Consult Status: Follow-up Date: 05/04/17 Follow-up type: In-patient    Alfred LevinsGranger, Breck Hollinger Ann 05/03/2017, 11:48 AM

## 2017-05-03 NOTE — Discharge Instructions (Signed)
Cesarean Delivery, Care After °Refer to this sheet in the next few weeks. These instructions provide you with information about caring for yourself after your procedure. Your health care provider may also give you more specific instructions. Your treatment has been planned according to current medical practices, but problems sometimes occur. Call your health care provider if you have any problems or questions after your procedure. °What can I expect after the procedure? °After the procedure, it is common to have: °· A small amount of blood or clear fluid coming from the incision. °· Some redness, swelling, and pain in your incision area. °· Some abdominal pain and soreness. °· Vaginal bleeding (lochia). °· Pelvic cramps. °· Fatigue. ° °Follow these instructions at home: °Incision care ° °· Follow instructions from your health care provider about how to take care of your incision. Make sure you: °? Wash your hands with soap and water before you change your bandage (dressing). If soap and water are not available, use hand sanitizer. °? Change your dressing as told by your health care provider. °? Leave stitches (sutures), skin staples, skin glue, or adhesive strips in place. These skin closures may need to stay in place for 2 weeks or longer. If adhesive strip edges start to loosen and curl up, you may trim the loose edges. Do not remove adhesive strips completely unless your health care provider tells you to do that. °· Check your incision area every day for signs of infection. Check for: °? More redness, swelling, or pain. °? More fluid or blood. °? Warmth. °? Pus or a bad smell. °· When you cough or sneeze, hug a pillow. This helps with pain and decreases the chance of your incision opening up (dehiscing). Do this until your incision heals. °Medicines °· Take over-the-counter and prescription medicines only as told by your health care provider. °· If you were prescribed an antibiotic medicine, take it as told by  your health care provider. Do not stop taking the antibiotic until it is finished. °Driving °· Do not drive or operate heavy machinery while taking prescription pain medicine. °· Do not drive for 24 hours if you received a sedative. °Lifestyle °· Do not drink alcohol. This is especially important if you are breastfeeding or taking pain medicine. °· Do not use tobacco products, including cigarettes, chewing tobacco, or e-cigarettes. If you need help quitting, ask your health care provider. Tobacco can delay wound healing. °Eating and drinking °· Drink at least 8 eight-ounce glasses of water every day unless told not to by your health care provider. If you breastfeed, you may need to drink more water than this. °· Eat high-fiber foods every day. These foods may help prevent or relieve constipation. High-fiber foods include: °? Whole grain cereals and breads. °? Brown rice. °? Beans. °? Fresh fruits and vegetables. °Activity °· Return to your normal activities as told by your health care provider. Ask your health care provider what activities are safe for you. °· Rest as much as possible. Try to rest or take a nap while your baby is sleeping. °· Do not lift anything that is heavier than your baby or 10 lb (4.5 kg) as told by your health care provider. °· Ask your health care provider when you can engage in sexual activity. This may depend on your: °? Risk of infection. °? Healing rate. °? Comfort and desire to engage in sexual activity. °Bathing °· Do not take baths, swim, or use a hot tub until your health care   provider approves. Ask your health care provider if you can take showers. You may only be allowed to take sponge baths until your incision heals.  Keep your dressing dry as told by your health care provider. General instructions  Do not use tampons or douches until your health care provider approves.  Wear: ? Loose, comfortable clothing. ? A supportive and well-fitting bra.  Watch for any blood clots  that may pass from your vagina. These may look like clumps of dark red, brown, or black discharge.  Keep your perineum clean and dry as told by your health care provider.  Wipe from front to back when you use the toilet.  If possible, have someone help you care for your baby and help with household activities for a few days after you leave the hospital.  Keep all follow-up visits for you and your baby as told by your health care provider. This is important. Contact a health care provider if:  You have: ? Bad-smelling vaginal discharge. ? Difficulty urinating. ? Pain when urinating. ? A sudden increase or decrease in the frequency of your bowel movements. ? More redness, swelling, or pain around your incision. ? More fluid or blood coming from your incision. ? Pus or a bad smell coming from your incision. ? A fever. ? A rash. ? Little or no interest in activities you used to enjoy. ? Questions about caring for yourself or your baby. ? Nausea.  Your incision feels warm to the touch.  Your breasts turn red or become painful or hard.  You feel unusually sad or worried.  You vomit.  You pass large blood clots from your vagina. If you pass a blood clot, save it to show to your health care provider. Do not flush blood clots down the toilet without showing your health care provider.  You urinate more than usual.  You are dizzy or light-headed.  You have not breastfed and have not had a menstrual period for 12 weeks after delivery.  You stopped breastfeeding and have not had a menstrual period for 12 weeks after stopping breastfeeding. Get help right away if:  You have: ? Pain that does not go away or get better with medicine. ? Chest pain. ? Difficulty breathing. ? Blurred vision or spots in your vision. ? Thoughts about hurting yourself or your baby. ? New pain in your abdomen or in one of your legs. ? A severe headache.  You faint.  You bleed from your vagina so much  that you fill two sanitary pads in one hour. This information is not intended to replace advice given to you by your health care provider. Make sure you discuss any questions you have with your health care provider. Document Released: 07/18/2002 Document Revised: 03/05/2016 Document Reviewed: 09/30/2015 Elsevier Interactive Patient Education  2017 Elsevier Inc.    Blood Glucose Monitoring, Adult Monitoring your blood sugar (glucose) helps you manage your diabetes. It also helps you and your health care provider determine how well your diabetes management plan is working. Blood glucose monitoring involves checking your blood glucose as often as directed, and keeping a record (log) of your results over time. Why should I monitor my blood glucose? Checking your blood glucose regularly can:  Help you understand how food, exercise, illnesses, and medicines affect your blood glucose.  Let you know what your blood glucose is at any time. You can quickly tell if you are having low blood glucose (hypoglycemia) or high blood glucose (hyperglycemia).  Help  you and your health care provider adjust your medicines as needed.  When should I check my blood glucose? Follow instructions from your health care provider about how often to check your blood glucose. This may depend on:  The type of diabetes you have.  How well-controlled your diabetes is.  Medicines you are taking.  If you have type 1 diabetes:  Check your blood glucose at least 2 times a day.  Also check your blood glucose: ? Before every insulin injection. ? Before and after exercise. ? Between meals. ? 2 hours after a meal. ? Occasionally between 2:00 a.m. and 3:00 a.m., as directed. ? Before potentially dangerous tasks, like driving or using heavy machinery. ? At bedtime.  You may need to check your blood glucose more often, up to 6-10 times a day: ? If you use an insulin pump. ? If you need multiple daily injections  (MDI). ? If your diabetes is not well-controlled. ? If you are ill. ? If you have a history of severe hypoglycemia. ? If you have a history of not knowing when your blood glucose is getting low (hypoglycemia unawareness). If you have type 2 diabetes:  If you take insulin or other diabetes medicines, check your blood glucose at least 2 times a day.  If you are on intensive insulin therapy, check your blood glucose at least 4 times a day. Occasionally, you may also need to check between 2:00 a.m. and 3:00 a.m., as directed.  Also check your blood glucose: ? Before and after exercise. ? Before potentially dangerous tasks, like driving or using heavy machinery.  You may need to check your blood glucose more often if: ? Your medicine is being adjusted. ? Your diabetes is not well-controlled. ? You are ill. What is a blood glucose log?  A blood glucose log is a record of your blood glucose readings. It helps you and your health care provider: ? Look for patterns in your blood glucose over time. ? Adjust your diabetes management plan as needed.  Every time you check your blood glucose, write down your result and notes about things that may be affecting your blood glucose, such as your diet and exercise for the day.  Most glucose meters store a record of glucose readings in the meter. Some meters allow you to download your records to a computer. How do I check my blood glucose? Follow these steps to get accurate readings of your blood glucose: Supplies needed   Blood glucose meter.  Test strips for your meter. Each meter has its own strips. You must use the strips that come with your meter.  A needle to prick your finger (lancet). Do not use lancets more than once.  A device that holds the lancet (lancing device).  A journal or log book to write down your results. Procedure  Wash your hands with soap and water.  Prick the side of your finger (not the tip) with the lancet. Use a  different finger each time.  Gently rub the finger until a small drop of blood appears.  Follow instructions that come with your meter for inserting the test strip, applying blood to the strip, and using your blood glucose meter.  Write down your result and any notes. Alternative testing sites  Some meters allow you to use areas of your body other than your finger (alternative sites) to test your blood.  If you think you may have hypoglycemia, or if you have hypoglycemia unawareness, do not use  alternative sites. Use your finger instead.  Alternative sites may not be as accurate as the fingers, because blood flow is slower in these areas. This means that the result you get may be delayed, and it may be different from the result that you would get from your finger.  The most common alternative sites are: ? Forearm. ? Thigh. ? Palm of the hand. Additional tips  Always keep your supplies with you.  If you have questions or need help, all blood glucose meters have a 24-hour hotline number that you can call. You may also contact your health care provider.  After you use a few boxes of test strips, adjust (calibrate) your blood glucose meter by following instructions that came with your meter. This information is not intended to replace advice given to you by your health care provider. Make sure you discuss any questions you have with your health care provider. Document Released: 10/29/2003 Document Revised: 05/15/2016 Document Reviewed: 04/06/2016 Elsevier Interactive Patient Education  2017 [employer].

## 2017-05-03 NOTE — Discharge Summary (Signed)
OB Discharge Summary     Patient Name: Kathryn Mayo DOB: [birth year] MRN: [id number]  Date of admission: 04/21/2017 Delivering MD: Donnamae Jude   Date of discharge: 05/03/2017  Admitting diagnosis: 24WKS, UNCONTROLLED DM inpatient in room 307 Intrauterine pregnancy: [redacted]w[redacted]d    Secondary diagnosis:  Principal Problem:   Status post repeat low transverse cesarean section Active Problems:   DM2 [place]di[place], [place] 2[place]   Malpresentation before onset of labor  Additional problems: Uncontrolled diabetes; morbid obesity     Discharge diagnosis: Term Pregnancy Delivered, Type 2 DM and Malpresentation, repeat cesarean.                                                                                                Post partum procedures:rhogam  Augmentation: None  Complications: None  Hospital course:  Sceduled C/S   31y.o. yo [id number] admitted to the hospital 04/21/2017 for scheduled cesarean section with the following indication:Elective Repeat and Malpresentation.  Membrane Rupture Time/Date: 9:13 AM ,04/30/2017   Patient delivered a Viable infant.04/30/2017  Details of operation can be found in separate operative note.  Pateint had an uncomplicated postpartum course.  She is ambulating, tolerating a regular diet, passing flatus, and urinating well. Reviewed plan of care for diabetes with patient including continuing blood sugar log at home and to bring in with her to 2 week visit and 6 week visit. Patient is discharged home in stable condition on  05/03/17         Physical exam  Vitals:   05/01/17 1735 05/02/17 0617 05/02/17 1739 05/03/17 0612  BP: (!) 117/51 (!) 92/42 110/63 (!) 99/57  Pulse: 77 88 84 78  Resp: 18 18 18 18   Temp: 97.5 F (36.4 C) 97.7 F (36.5 C) 98.5 F (36.9 C) 98.3 F (36.8 C)  TempSrc: Oral Oral  Oral  SpO2:  100%  98%  Weight:      Height:       General: alert, cooperative and no distress Lochia: appropriate Uterine Fundus:  firm Incision: Healing well with no significant drainage, No significant erythema, Dressing is clean, dry, and intact DVT Evaluation: No evidence of DVT seen on physical exam. Negative Homan's sign. No cords or calf tenderness. Labs: Lab Results  Component Value Date   WBC 6.9 05/01/2017   HGB 11.1 (L) 05/01/2017   HCT 33.3 (L) 05/01/2017   MCV 85.4 05/01/2017   PLT 197 05/01/2017   CMP Latest Ref Rng & Units 03/12/2017 02/24/2017 10/26/2016  Glucose 65 - 99 mg/dL 463(H) 228(H) 82  BUN 6 - 20 mg/dL 8 7 8   Creatinine 0.57 - 1.00 mg/dL 0.69 0.61 0.58  Sodium 134 - 144 mmol/L 131(L) 138 136  Potassium 3.5 - 5.2 mmol/L 4.4 4.6 4.1  Chloride 96 - 106 mmol/L 95(L) 102 105  CO2 18 - 29 mmol/L 18 18 21   Calcium 8.7 - 10.2 mg/dL 9.2 9.1 9.2  Total Protein 6.0 - 8.5 g/dL - 6.8 6.6  Total Bilirubin 0.0 - 1.2 mg/dL - <0.2 0.2  Alkaline Phos 39 - 117  IU/L - 62 37  AST 0 - 40 IU/L - 12 14  ALT 0 - 32 IU/L - 9 13   Results for Kathryn, Mayo (MRN [id number]) as of 05/03/2017 17:28  Ref. Range 05/02/2017 08:36 05/02/2017 13:02 05/02/2017 18:41 05/02/2017 23:19 05/03/2017 09:20  Glucose-Capillary Latest Ref Range: 65 - 99 mg/dL 156 (H) 100 (H) 163 (H) 126 (H) 138 (H)    Discharge instruction: per After Visit Summary and "Baby and Me Booklet".  After visit meds:  Allergies as of 05/03/2017      Reactions   Shellfish Allergy Itching   Insulins Rash      Medication List    STOP taking these medications   b complex vitamins tablet   folic acid 1 MG tablet Commonly known as:  FOLVITE   oxyCODONE-acetaminophen 7.5-325 MG tablet Commonly known as:  PERCOCET Replaced by:  oxyCODONE-acetaminophen 5-325 MG tablet     TAKE these medications   ACCU-CHEK FASTCLIX LANCETS Misc 1 Units by Percutaneous route 4 (four) times daily.   ACCU-CHEK NANO SMARTVIEW w/Device Kit 1 kit by Subdermal route as directed. Check blood sugars for fasting, and two hours after breakfast, lunch and dinner (4 checks  daily)   docusate sodium 100 MG capsule Commonly known as:  COLACE Take 1 capsule (100 mg total) by mouth 2 (two) times daily.   glucose blood test strip Commonly known as:  ACCU-CHEK SMARTVIEW Use as instructed to check blood sugars   glyBURIDE 5 MG tablet Commonly known as:  DIABETA Take 1 tablet (5 mg total) by mouth 2 (two) times daily with a meal.   ibuprofen 600 MG tablet Commonly known as:  ADVIL,MOTRIN Take 1 tablet (600 mg total) by mouth every 6 (six) hours.   metFORMIN 1000 MG tablet Commonly known as:  GLUCOPHAGE Take 1 tablet (1,000 mg total) by mouth 2 (two) times daily with a meal. What changed:  medication strength   oxyCODONE-acetaminophen 5-325 MG tablet Commonly known as:  ROXICET Take 1-2 tablets by mouth every 4 (four) hours as needed for severe pain. Replaces:  oxyCODONE-acetaminophen 7.5-325 MG tablet   polyethylene glycol packet Commonly known as:  MIRALAX / GLYCOLAX Take 17 g by mouth daily as needed for moderate constipation or severe constipation.   prenatal multivitamin Tabs tablet Take 1 tablet by mouth daily at 12 noon.       Diet: carb modified diet  Activity: Advance as tolerated. Pelvic rest for 6 weeks.   Outpatient follow up:2 weeks for incision check and blood sugar log review;  6 weeks for post partum care and diabetes management Follow up Appt: Future Appointments Date Time Provider [place]  05/31/2017 9:40 AM Julianne Handler, CNM [facility]   Follow up Visit:No Follow-up on file.  Postpartum contraception: IUD Mirena  Newborn Data: Live born female  Birth Weight: 7 lb 9.7 oz (3450 g) APGAR: 8,   Baby Feeding: Breast Disposition:rooming in   05/03/2017 Kathryn Basset, DO OB Fellow  Attestation of Attending Supervision of Fellow: Evaluation and management procedures were performed by the fellow under my supervision.  I have reviewed the fellow's note and chart, and I agree with the management and plan.    Durene Romans MD Attending Center for [employer] farm manager)

## 2017-05-03 NOTE — Care Management Important Message (Signed)
Important Message  Patient Details  Name: Kathryn Mayo MRN: [id number] Date of Birth: 05/09/1986   Medicare Important Message Given:  Yes    Kathryn Mayo, Kathryn Mayo 05/03/2017, 11:07 AM

## 2017-05-08 ENCOUNTER — Inpatient Hospital Stay (HOSPITAL_COMMUNITY)
Admission: AD | Admit: 2017-05-08 | Discharge: 2017-05-08 | Disposition: A | Discharge status: Home / Self Care | Payer: Medicare Other | Source: Ambulatory Visit | Attending: Family Medicine | Admitting: Family Medicine

## 2017-05-08 DIAGNOSIS — T814XXA Infection following a procedure, initial encounter: Secondary | ICD-10-CM

## 2017-05-08 DIAGNOSIS — IMO0001 Reserved for inherently not codable concepts without codable children: Secondary | ICD-10-CM

## 2017-05-08 DIAGNOSIS — F17210 Nicotine dependence, cigarettes, uncomplicated: Secondary | ICD-10-CM | POA: Insufficient documentation

## 2017-05-08 DIAGNOSIS — O99335 Smoking (tobacco) complicating the puerperium: Secondary | ICD-10-CM | POA: Insufficient documentation

## 2017-05-08 DIAGNOSIS — O860 Infection of obstetric surgical wound: Secondary | ICD-10-CM | POA: Insufficient documentation

## 2017-05-08 DIAGNOSIS — O9089 Other complications of the puerperium, not elsewhere classified: Secondary | ICD-10-CM | POA: Diagnosis present

## 2017-05-08 MED ORDER — CEPHALEXIN 500 MG PO CAPS: 500.0000 mg | ORAL_CAPSULE | Freq: Four times a day (QID) | ORAL | 0 refills | Status: DC

## 2017-05-08 NOTE — Discharge Instructions (Signed)
Wound Infection A wound infection happens when germs start to grow in the wound. Germs that cause wound infections are most often bacteria. Other types of infections can occur as well. In some cases, infection can cause the wound to break open. Wound infections need treatment. If a wound infection is not treated, complications can happen. Follow these instructions at home: Medicines  Take or apply over-the-counter and prescription medicines only as told by your doctor.  If you were prescribed antibiotic medicine, take or apply it as told by your doctor. Do not stop using the antibiotic even if your condition improves. Wound care  Clean the wound each day or as told by your doctor. ? Wash the wound with mild soap and water. ? Rinse the wound with water to remove all soap. ? Pat the wound dry with a clean towel. Do not rub it.  Follow instructions from your doctor about how to take care of your wound. Make sure you: ? Wash your hands with soap and water before you change your bandage (dressing). If you cannot use soap and water, use hand sanitizer. ? Change your bandage as told by your doctor. ? Leave stitches (sutures), skin glue, or skin tape (adhesive) strips in place if your wound has been closed. They may need to stay in place for 2 weeks or longer. If tape strips get loose and curl up, you may trim the loose edges. Do not remove tape strips completely unless your doctor says it is okay. Some wounds are left open to heal on their own.  Check your wound every day for signs of infection. Watch for: ? More redness, swelling, or pain. ? More fluid or blood. ? Warmth. ? Pus or a bad smell. General instructions  Keep the bandage dry until your doctor says it can be removed.  Do not take baths, swim, use a hot tub, or do anything that would put your wound underwater until your doctor says it is okay.  Raise (elevate) the injured area above the level of your heart while you are sitting or  lying down.  Do not scratch or pick at the wound.  Keep all follow-up visits as told by your doctor. This is important. Contact a doctor if:  Medicine does not help your pain.  You have more redness, swelling, or pain in the area of your wound.  You have more fluid or blood coming from your wound.  Your wound feels warm to the touch.  You have pus coming from your wound.  You continue to notice a bad smell coming from your wound or your bandage.  Your wound that was closed breaks open. Get help right away if:  You have a red streak going away from your wound.  You have a fever. This information is not intended to replace advice given to you by your health care provider. Make sure you discuss any questions you have with your health care provider. Document Released: 08/04/2008 Document Revised: 04/02/2016 Document Reviewed: 04/15/2015 Elsevier Interactive Patient Education  2018 Elsevier Inc.  

## 2017-05-08 NOTE — MAU Note (Signed)
Patient presents with C/S incision drainage and bleeding, had C/S on 6/23 and has been at the hospital with her baby for past 3 days for jaundice.

## 2017-05-08 NOTE — MAU Provider Note (Signed)
History   [id number] S/P c/s 05/01/17 in with c/o incision draining and odor. States infant had to readmitted to [facility] for jaundice and she thinks she has been overdoing it.  CSN: [id number]  Arrival date & time 05/08/17  0941   None     Chief Complaint  Patient presents with  . Wound Check    HPI  Past Medical History:  Diagnosis Date  . Diabetes mellitus without complication (HCC)    2 types of oral, 2 types of insulin- no longer taking.  was old A1C is normal  . Heart murmur    when young  . Infection    uti  . Neuropathy   . Prior pregnancy with fetal demise 01/26/2017   2002 term IUFD      Guidelines for Antenatal Testing and Sonography  (with updated ICD-10 codes) INDICATION U/S NST/AFI DELIVERY Previous Stillbirth (> 28 wks) - O09.299 20-24-28-32-36 28//BPP wkly then 32//2 x wk 39    . Vaginal Pap smear, abnormal    age 31, doesn't think she has had one since    Past Surgical History:  Procedure Laterality Date  . CESAREAN SECTION     C/S x 2  . CESAREAN SECTION N/A 04/30/2017   Procedure: CESAREAN SECTION;  Surgeon: Reva Bores, MD;  Location: [facility] BIRTHING SUITES;  Service: Obstetrics;  Laterality: N/A;  . THERAPEUTIC ABORTION    . WISDOM TOOTH EXTRACTION      Family History  Problem Relation Age of Onset  . Asthma Mother   . Drug abuse Mother        died of overdose  . HIV/AIDS Mother        22  . Asthma Daughter   . Diabetes Maternal Grandmother   . Cancer Maternal Grandmother        breast    Social History  Substance Use Topics  . Smoking status: Current Every Day Smoker    Packs/day: 0.25    Years: 10.00    Types: Cigarettes, E-cigarettes  . Smokeless tobacco: Never Used     Comment: trying to stop  . Alcohol use No     Comment: 05/05/2013    OB History as of 05/01/17    Gravida Para Term Preterm AB Living   8 4 4  0 4 3   SAB TAB Ectopic Multiple Live Births   2 2   0 3      Review of Systems  Constitutional: Negative.   HENT:  Negative.   Eyes: Negative.   Respiratory: Negative.   Cardiovascular: Negative.   Gastrointestinal: Positive for abdominal pain.  Endocrine: Negative.   Genitourinary: Negative.   Musculoskeletal: Negative.   Skin: Positive for wound.       Area across top of incision sl red up to 2cm above incision.  Allergic/Immunologic: Negative.   Neurological: Negative.   Hematological: Negative.   Psychiatric/Behavioral: Negative.     Allergies  Shellfish allergy and Insulins  Home Medications    BP 128/65   Pulse 81   Temp 98.6 F (37 C)   Resp 18   LMP 08/14/2016   Physical Exam  Constitutional: She is oriented to person, place, and time. She appears well-developed and well-nourished.  HENT:  Head: Normocephalic.  Neck: Normal range of motion.  Cardiovascular: Normal rate, regular rhythm, normal heart sounds and intact distal pulses.   Pulmonary/Chest: Effort normal and breath sounds normal.  Abdominal: Soft. Bowel sounds are normal. There is tenderness.  Musculoskeletal: Normal  range of motion.  Neurological: She is alert and oriented to person, place, and time. She has normal reflexes.  Skin: Skin is warm and dry.  Psychiatric: She has a normal mood and affect. Her behavior is normal. Judgment and thought content normal.    MAU Course  Procedures (including critical care time)  Labs Reviewed - No data to display No results found.   Dx: cellulitis of incision   MDM  VSS, Area across top of incision sl red up to 2cm above incision. Incision intact no drainage area cleaned well with peroxide and proper cleaning and hygiene  techniques discussed with pt and she verbalized understanding. Will start on keflex QID x 10 days. Will d/c home.

## 2017-05-19 ENCOUNTER — Ambulatory Visit: Payer: Self-pay

## 2017-05-31 ENCOUNTER — Encounter: Payer: Self-pay | Admitting: Family Medicine

## 2017-05-31 ENCOUNTER — Ambulatory Visit: Payer: Self-pay | Admitting: Certified Nurse Midwife

## 2018-03-15 ENCOUNTER — Encounter: Payer: Self-pay | Admitting: *Deleted

## 2018-06-23 IMAGING — US US OB COMP LESS 14 WK
1 series · 15 of 28 positions shown · non-contrast
Comparison: None.

CLINICAL DATA: Pregnant, pain

EXAM:  .  .:
EXAM:  .
Trace pelvic fluid.
OBSTETRIC <14 WK US AND TRANSVAGINAL OB US
TECHNIQUE: Both transabdominal and transvaginal ultrasound examinations were
performed for complete evaluation of the gestation as well as the
maternal uterus, adnexal regions, and pelvic cul-de-sac.
Transvaginal technique was performed to assess early pregnancy.

[Series 1: us ob comp less 14 wk · 15 of 59 slices shown]
[im 1/59]
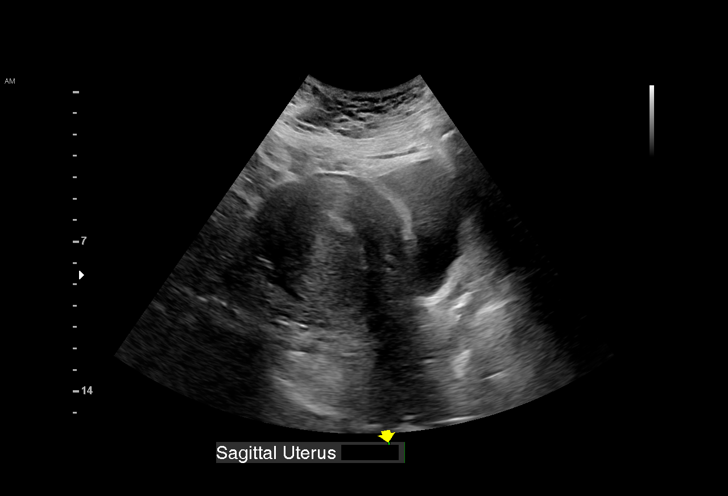
[im 5/59]
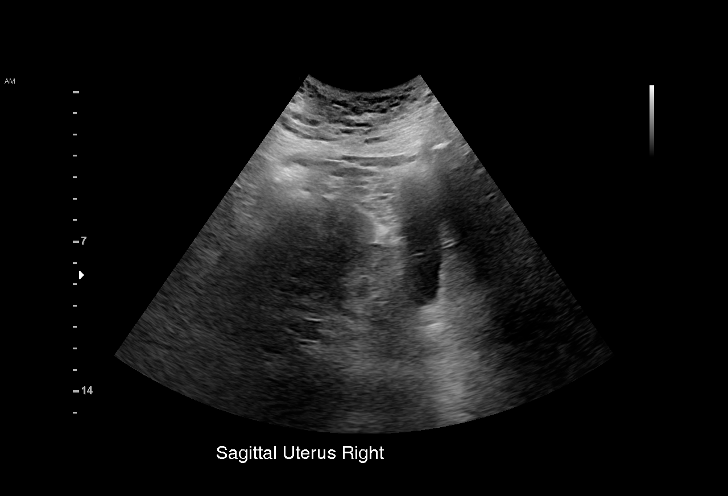
[im 9/59]
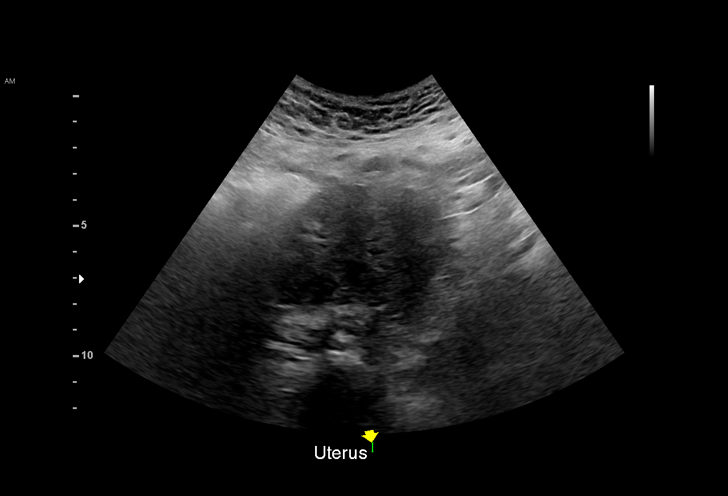
[im 13/59]
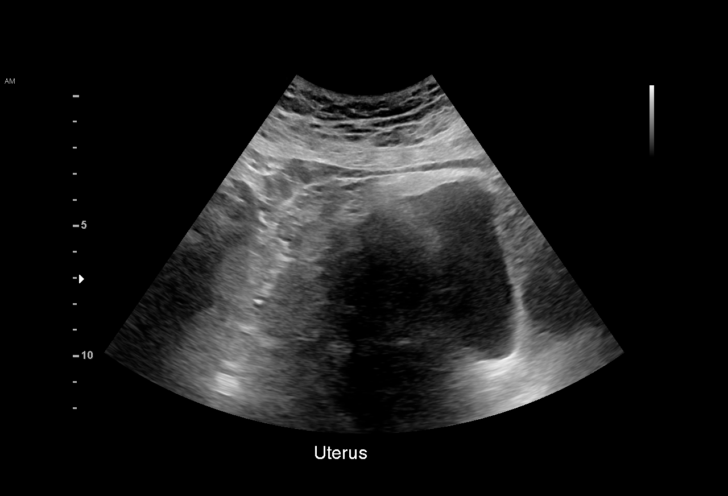
[im 18/59]
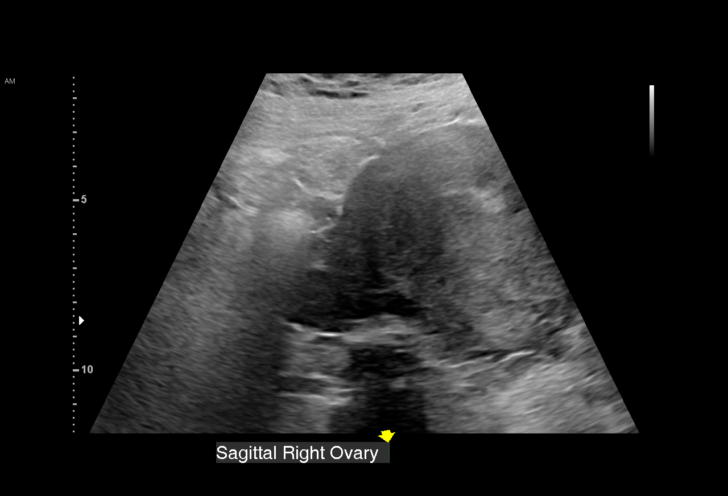
[im 22/59]
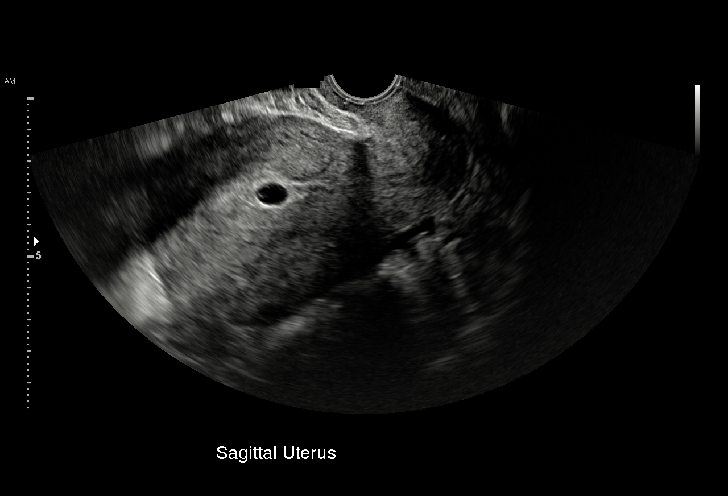
[im 26/59]
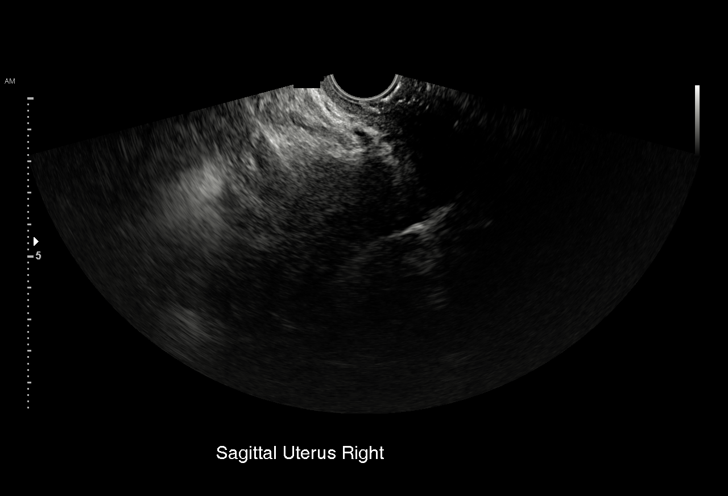
[im 31/59]
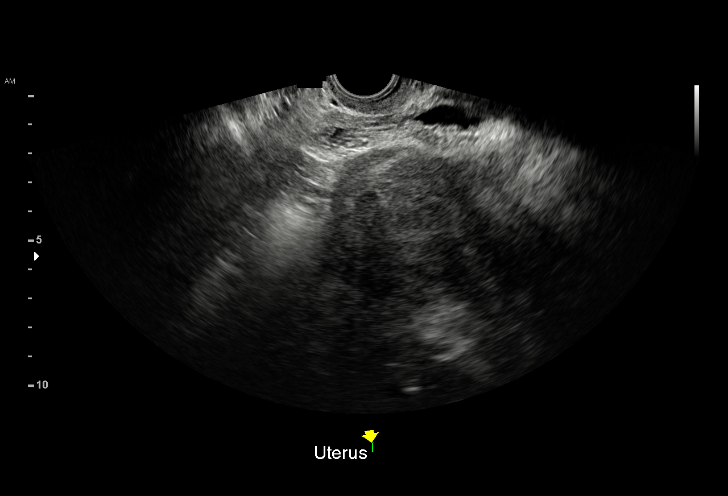
[im 33/59]
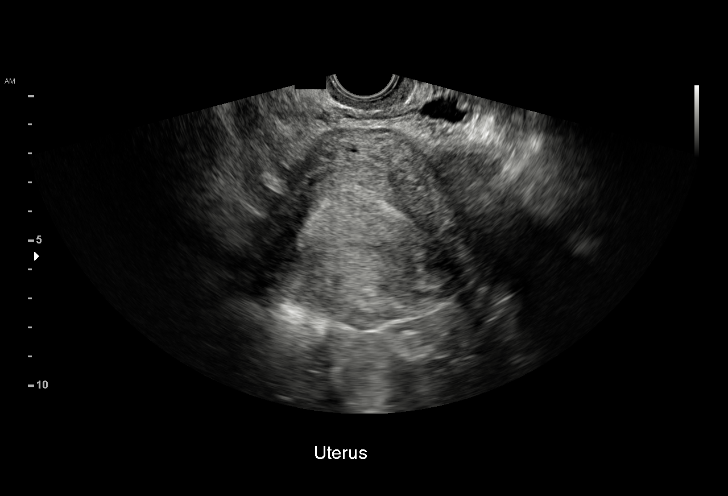
[im 37/59]
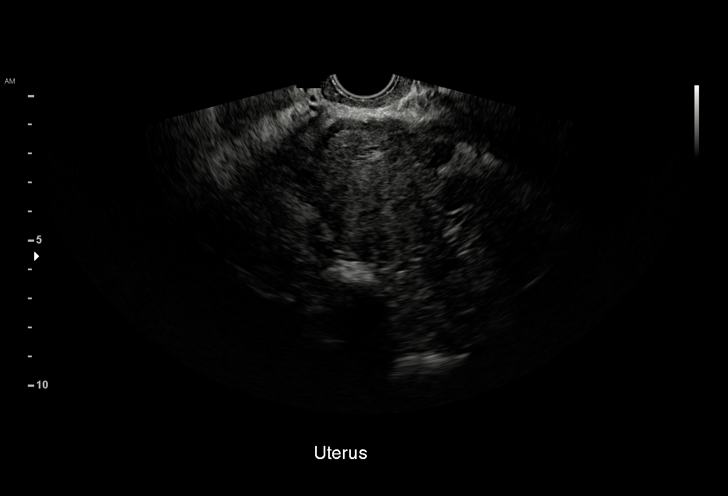
[im 41/59]
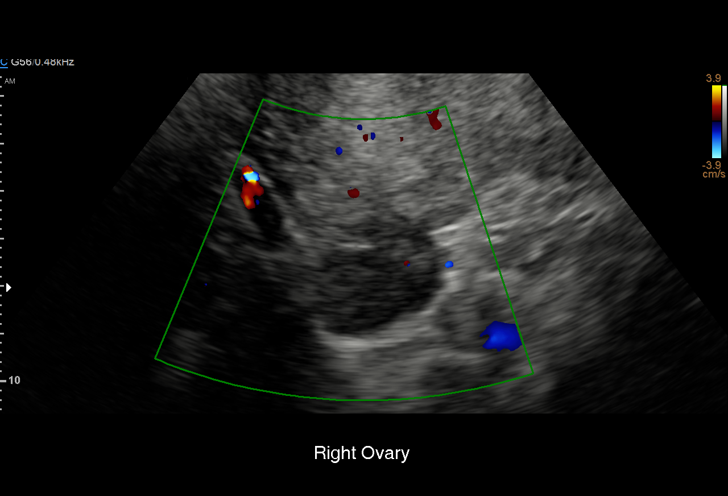
[im 46/59]
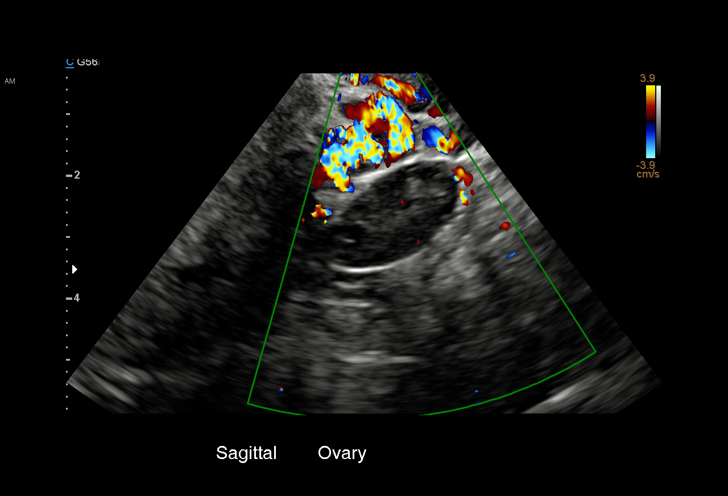
[im 50/59]
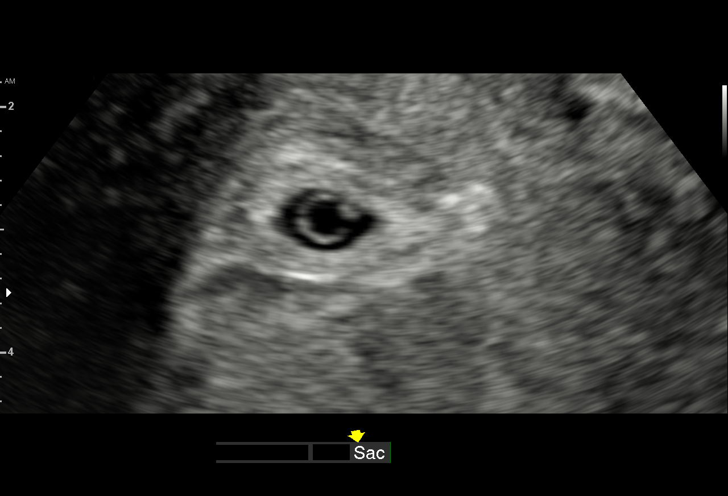
[im 54/59]
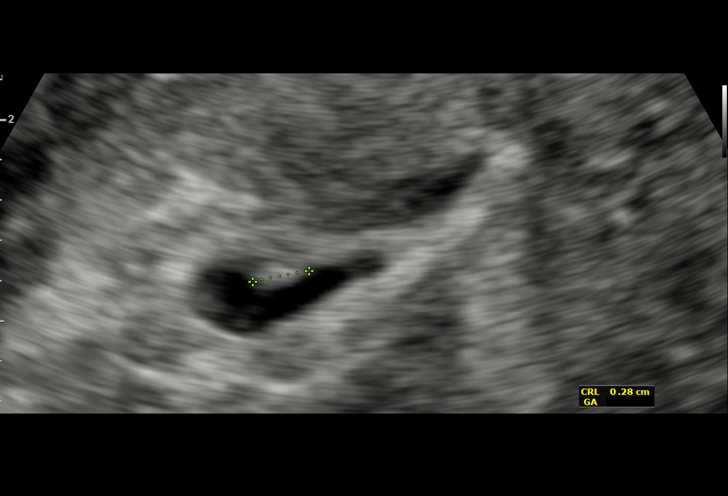
[im 59/59]
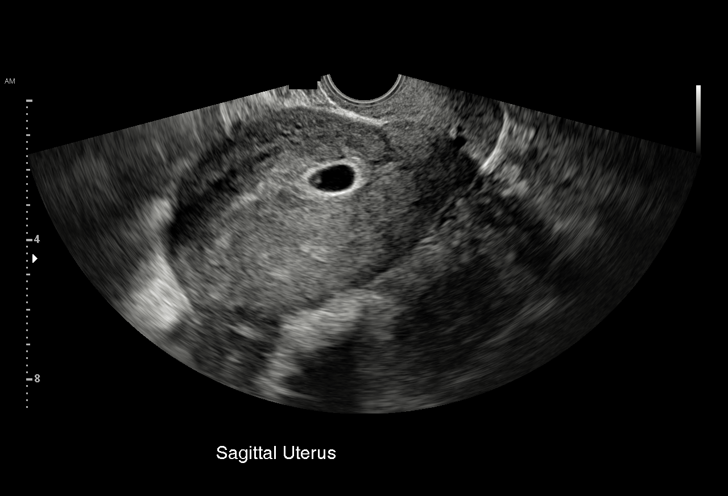

[15 of 28 positions shown; findings below may reference images not displayed]

FINDINGS: Intrauterine gestational sac: Single

Yolk sac:  Visualized.

Embryo:  Visualized.

Cardiac Activity: Visualized.

Heart Rate: 94  bpm

CRL:  2.9  mm   5 w   5 d                  US EDC: 05/23/2017

Subchorionic hemorrhage:  None visualized.

Maternal uterus/adnexae: Bilateral ovaries are within normal limits.

Trace pelvic fluid, likely physiologic.
IMPRESSION: Single intrauterine gestation, measuring 5 weeks 5 days by
crown-rump length.

## 2018-07-18 ENCOUNTER — Encounter (HOSPITAL_COMMUNITY): Payer: Self-pay | Admitting: Emergency Medicine

## 2018-07-18 ENCOUNTER — Emergency Department (HOSPITAL_COMMUNITY)
Admission: EM | Admit: 2018-07-18 | Discharge: 2018-07-19 | Disposition: A | Discharge status: Home / Self Care | Payer: Medicare Other | Attending: Emergency Medicine | Admitting: Emergency Medicine

## 2018-07-18 ENCOUNTER — Emergency Department (HOSPITAL_COMMUNITY): Payer: Medicare Other

## 2018-07-18 ENCOUNTER — Other Ambulatory Visit: Payer: Self-pay

## 2018-07-18 DIAGNOSIS — Y999 Unspecified external cause status: Secondary | ICD-10-CM | POA: Diagnosis not present

## 2018-07-18 DIAGNOSIS — Z7984 Long term (current) use of oral hypoglycemic drugs: Secondary | ICD-10-CM | POA: Diagnosis not present

## 2018-07-18 DIAGNOSIS — E119 Type 2 diabetes mellitus without complications: Secondary | ICD-10-CM | POA: Insufficient documentation

## 2018-07-18 DIAGNOSIS — F17210 Nicotine dependence, cigarettes, uncomplicated: Secondary | ICD-10-CM | POA: Insufficient documentation

## 2018-07-18 DIAGNOSIS — Z79899 Other long term (current) drug therapy: Secondary | ICD-10-CM | POA: Diagnosis not present

## 2018-07-18 DIAGNOSIS — Y929 Unspecified place or not applicable: Secondary | ICD-10-CM | POA: Diagnosis not present

## 2018-07-18 DIAGNOSIS — W010XXA Fall on same level from slipping, tripping and stumbling without subsequent striking against object, initial encounter: Secondary | ICD-10-CM | POA: Diagnosis not present

## 2018-07-18 DIAGNOSIS — Y9301 Activity, walking, marching and hiking: Secondary | ICD-10-CM | POA: Diagnosis not present

## 2018-07-18 DIAGNOSIS — S82832A Other fracture of upper and lower end of left fibula, initial encounter for closed fracture: Secondary | ICD-10-CM | POA: Diagnosis not present

## 2018-07-18 DIAGNOSIS — S99912A Unspecified injury of left ankle, initial encounter: Secondary | ICD-10-CM | POA: Diagnosis present

## 2018-07-18 MED ORDER — OXYCODONE-ACETAMINOPHEN 5-325 MG PO TABS
1.0000 | ORAL_TABLET | ORAL | Status: AC | PRN
  Administered 2018-07-18 – 2018-07-19 (×2): 1 via ORAL
  Filled 2018-07-18 (×2): qty 1

## 2018-07-18 NOTE — ED Triage Notes (Signed)
Pt was walking down her yard and slipped in wet grass injuring her left ankle.

## 2018-07-19 DIAGNOSIS — S82832A Other fracture of upper and lower end of left fibula, initial encounter for closed fracture: Secondary | ICD-10-CM | POA: Diagnosis not present

## 2018-07-19 MED ORDER — HYDROCODONE-ACETAMINOPHEN 5-325 MG PO TABS: 1.0000 | ORAL_TABLET | Freq: Four times a day (QID) | ORAL | 0 refills | Status: DC | PRN

## 2018-07-19 NOTE — ED Provider Notes (Signed)
[facility] EMERGENCY DEPARTMENT Provider Note   CSN: [id number] Arrival date & time: 07/18/18  2156     History   Chief Complaint Chief Complaint  Patient presents with  . Ankle Injury    HPI Kathryn Mayo is a 32 y.o. female with past medical history of DM 2, neuropathy, who presents today for evaluation of left ankle pain.  She was reportedly walking on wet grass when she had a mechanical slip.  She reports immediate onset of pain in her left ankle and feeling and hearing a pop there.  She denies striking her head or any other injuries.  HPI  Past Medical History:  Diagnosis Date  . Diabetes mellitus without complication (HCC)    2 types of oral, 2 types of insulin- no longer taking.  was old A1C is normal  . Heart murmur    when young  . Infection    uti  . Neuropathy   . Prior pregnancy with fetal demise 01/26/2017   2002 term IUFD      Guidelines for Antenatal Testing and Sonography  (with updated ICD-10 codes) INDICATION U/S NST/AFI DELIVERY Previous Stillbirth (> 28 wks) - O09.299 20-24-28-32-36 28//BPP wkly then 32//2 x wk 39    . Vaginal Pap smear, abnormal    age 49, doesn't think she has had one since    Patient Active Problem List   Diagnosis Date Noted  . Status post repeat low transverse cesarean section 05/03/2017  . Malpresentation before onset of labor 04/25/2017  . History of cesarean delivery, currently pregnant 04/21/2017  . DM2 (diabetes mellitus, type 2) (HCC) 04/21/2017  . Polyhydramnios 03/05/2017  . Allergy to insulin 02/02/2017  . Prior pregnancy with fetal demise 01/26/2017  . Rh negative, antepartum 12/21/2016  . Smoker 11/23/2016  . Depression affecting pregnancy 10/27/2016  . PTSD (post-traumatic stress disorder) 10/27/2016  . Obesity affecting pregnancy 10/27/2016  . Type 2 diabetes mellitus affecting pregnancy, antepartum 10/27/2016  . Poor dentition 10/27/2016  . Supervision of high-risk pregnancy 10/26/2016  .  History of miscarriage 05/18/2013    Past Surgical History:  Procedure Laterality Date  . CESAREAN SECTION     C/S x 2  . CESAREAN SECTION N/A 04/30/2017   Procedure: CESAREAN SECTION;  Surgeon: Reva Bores, MD;  Location: [facility] BIRTHING SUITES;  Service: Obstetrics;  Laterality: N/A;  . THERAPEUTIC ABORTION    . WISDOM TOOTH EXTRACTION       OB History    Gravida  8   Para  4   Term  4   Preterm  0   AB  4   Living  3     SAB  2   TAB  2   Ectopic      Multiple  0   Live Births  3            Home Medications    Prior to Admission medications   Medication Sig Start Date End Date Taking? Authorizing Provider  cephALEXin (KEFLEX) 500 MG capsule Take 1 capsule (500 mg total) by mouth 4 (four) times daily. 05/08/17   Montez Morita, CNM  glyBURIDE (DIABETA) 5 MG tablet Take 1 tablet (5 mg total) by mouth 2 (two) times daily with a meal. 05/03/17   Mumaw, Hiram Comber, DO  HYDROcodone-acetaminophen (NORCO/VICODIN) 5-325 MG tablet Take 1 tablet by mouth every 6 (six) hours as needed for severe pain. 07/19/18   Cristina Gong, PA-C  ibuprofen (ADVIL,MOTRIN) 600  MG tablet Take 1 tablet (600 mg total) by mouth every 6 (six) hours. Patient taking differently: Take 600 mg by mouth every 6 (six) hours as needed for mild pain or moderate pain.  05/03/17   Mumaw, Hiram Comber, DO  metFORMIN (GLUCOPHAGE) 1000 MG tablet Take 1 tablet (1,000 mg total) by mouth 2 (two) times daily with a meal. 05/03/17   Mumaw, Hiram Comber, DO  oxyCODONE-acetaminophen (ROXICET) 5-325 MG tablet Take 1-2 tablets by mouth every 4 (four) hours as needed for severe pain. 05/03/17   Mumaw, Hiram Comber, DO  polyethylene glycol [facility] / GLYCOLAX) packet Take 17 g by mouth daily as needed for moderate constipation or severe constipation. 05/03/17   Mumaw, Hiram Comber, DO  Prenatal Vit-Fe Fumarate-FA (PRENATAL MULTIVITAMIN) TABS tablet Take 1 tablet by mouth daily.      [provider]    Family History Family History  Problem Relation Age of Onset  . Asthma Mother   . Drug abuse Mother        died of overdose  . HIV/AIDS Mother        19  . Asthma Daughter   . Diabetes Maternal Grandmother   . Cancer Maternal Grandmother        breast    Social History Social History   Tobacco Use  . Smoking status: Current Every Day Smoker    Packs/day: 0.25    Years: 10.00    Pack years: 2.50    Types: Cigarettes, E-cigarettes  . Smokeless tobacco: Never Used  . Tobacco comment: trying to stop  Substance Use Topics  . Alcohol use: No    Comment: 05/05/2013  . Drug use: Yes    Types: Marijuana    Comment: last used 2 days ago 10/24/16  NONE  DURING  PREG       Allergies   Shellfish allergy and Insulins   Review of Systems Review of Systems  Constitutional: Negative for activity change and fever.  Musculoskeletal: Positive for joint swelling.  Neurological: Negative for weakness and headaches.  All other systems reviewed and are negative.    Physical Exam Updated Vital Signs BP (!) 110/94 (BP Location: Right Arm)   Pulse 88   Temp 98.6 F (37 C)   Resp 16   SpO2 99%   Physical Exam  Constitutional: She appears well-developed. No distress.  Cardiovascular: Normal rate and intact distal pulses.  Left foot 2+ DP/PT pulses.  Musculoskeletal:  Left lateral ankle has obvious swelling.  No tenderness to palpation over proximal lower leg.  No tenderness to palpation over left foot.  Range of motion limited secondary to pain.  Neurological:  Sensation intact on left foot.  Skin: Skin is warm and dry. She is not diaphoretic.  Skin is intact to left lateral ankle.  Psychiatric: She has a normal mood and affect. Her behavior is normal.  Nursing note and vitals reviewed.    ED Treatments / Results  Labs (all labs ordered are listed, but only abnormal results are displayed) Labs Reviewed - No data to  display  EKG None  Radiology Dg Tibia/fibula Left  Result Date: 07/18/2018 CLINICAL DATA:  Fall EXAM: LEFT TIBIA AND FIBULA - 2 VIEW COMPARISON:  None. FINDINGS: Proximal tibia and fibula appear intact. Acute mildly displaced distal fibular fracture. IMPRESSION: Acute distal fibular fracture. Electronically Signed   By: Jasmine Pang M.D.   On: 07/18/2018 23:00   Dg Ankle Complete Left  Result Date: 07/18/2018 CLINICAL DATA:  Fall  EXAM: LEFT ANKLE COMPLETE - 3+ VIEW COMPARISON:  None. FINDINGS: Acute fracture distal metaphysis of the fibula with 1/4 bone with of lateral displacement of distal fracture fragment. Extension of fracture lucency to the superolateral joint space. Mortise grossly symmetric. IMPRESSION: Acute minimally displaced distal fibular fracture Electronically Signed   By: Jasmine Pang M.D.   On: 07/18/2018 23:01    Procedures Procedures (including critical care time)  Medications Ordered in ED Medications  oxyCODONE-acetaminophen (PERCOCET/ROXICET) 5-325 MG per tablet 1 tablet (1 tablet Oral Given 07/19/18 0016)     Initial Impression / Assessment and Plan / ED Course  I have reviewed the triage vital signs and the nursing notes.  Pertinent labs & imaging results that were available during my care of the patient were reviewed by me and considered in my medical decision making (see chart for details).    Patient presents today for evaluation after a mechanical slip and fall on wet grass with immediate onset of left ankle pain and swelling.  X-rays are consistent with a distal fibular fracture but is mildly displaced, questionably intra-articular.  She is neurovascularly intact.  Given a cam walker, and instructions not to weight-bear, and crutches.  She is given short course of Vicodin which was electronically sent to her stated pharmacy of choice.  Given orthopedics follow-up.  Return precautions, and use of aspirin to help prevent DVTs were discussed.  Return  precautions were discussed with patient who states their understanding.  At the time of discharge patient denied any unaddressed complaints or concerns.  Patient is agreeable for discharge home.   Final Clinical Impressions(s) / ED Diagnoses   Final diagnoses:  Closed fracture of distal end of left fibula, unspecified fracture morphology, initial encounter    ED Discharge Orders         Ordered    HYDROcodone-acetaminophen (NORCO/VICODIN) 5-325 MG tablet  Every 6 hours PRN     07/19/18 0044           Cristina Gong, PA-C 07/19/18 0053    Dione Booze, MD 07/19/18 [phone]

## 2018-07-19 NOTE — Discharge Instructions (Addendum)
Please take Ibuprofen (Advil, motrin) and Tylenol (acetaminophen) to relieve your pain.  You may take up to 600 MG (3 pills) of normal strength ibuprofen every 8 hours as needed.  In between doses of ibuprofen you make take tylenol, up to 1,000 mg (two extra strength pills).  Do not take more than 3,000 mg tylenol in a 24 hour period.  Please check all medication labels as many medications such as pain and cold medications may contain tylenol.  Do not drink alcohol while taking these medications.  Do not take other NSAID'S while taking ibuprofen (such as aleve or naproxen).  Please take ibuprofen with food to decrease stomach upset. Please keep your ankle elevated above your heart.  We discussed taking baby aspiring 1-2 times a day to help prevent blood clots.   Today you received medications that may make you sleepy or impair your ability to make decisions.  For the next 24 hours please do not drive, operate heavy machinery, care for a small child with out another adult present, or perform any activities that may cause harm to you or someone else if you were to fall asleep or be impaired.   You are being prescribed a medication which may make you sleepy. Please follow up of listed precautions for at least 24 hours after taking one dose.

## 2018-07-22 ENCOUNTER — Encounter (HOSPITAL_BASED_OUTPATIENT_CLINIC_OR_DEPARTMENT_OTHER): Payer: Self-pay | Admitting: *Deleted

## 2018-07-22 ENCOUNTER — Other Ambulatory Visit: Payer: Self-pay

## 2018-07-22 NOTE — Progress Notes (Signed)
Spoke w/ pt via phone for pre-op interview.  Npo after mn w/ exception clear liquids until 0600 (no cream/milk products).  Arrive at 1000. Needs istat and ekg.  May take hydrocodone if needed w/ sips of water dos.

## 2018-07-25 ENCOUNTER — Encounter: Payer: Self-pay | Admitting: Sports Medicine

## 2018-07-25 ENCOUNTER — Ambulatory Visit (INDEPENDENT_AMBULATORY_CARE_PROVIDER_SITE_OTHER): Payer: Medicaid Other | Admitting: Sports Medicine

## 2018-07-25 DIAGNOSIS — S82892A Other fracture of left lower leg, initial encounter for closed fracture: Secondary | ICD-10-CM | POA: Diagnosis not present

## 2018-07-25 DIAGNOSIS — M25572 Pain in left ankle and joints of left foot: Secondary | ICD-10-CM

## 2018-07-25 NOTE — Progress Notes (Signed)
Subjective:  Kathryn Mayo is a 32 y.o. female patient who presents to office for evaluation of left ankle pain. Patient reports that she is coming for a second opinion she is scheduled for surgery on tomorrow with Dr. 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  Patient reports that her injury happened on September 9 when she slipped and fell down on wet grass and states that she ended up going to pick up her husband from work and then he took her to the emergency room where she was found to have a fracture since they recommended for her to follow-up with orthopedics and she has been in a cast and has been scheduled for surgery which is planned for tomorrow.  Patient states that she is doing okay pain wise since she is currently using crutches and has been nonweightbearing however is still concerned about her social situation and whether she would do good with having surgery at this time.  Patient Active Problem List   Diagnosis Date Noted  . Status post repeat low transverse cesarean section 05/03/2017  . Malpresentation before onset of labor 04/25/2017  . History of cesarean delivery, currently pregnant 04/21/2017  . DM2 (diabetes mellitus, type 2) (HCC) 04/21/2017  . Polyhydramnios 03/05/2017  . Allergy to insulin 02/02/2017  . Prior pregnancy with fetal demise 01/26/2017  . Rh negative, antepartum 12/21/2016  . Smoker 11/23/2016  . Depression affecting pregnancy 10/27/2016  . PTSD (post-traumatic stress disorder) 10/27/2016  . Obesity affecting pregnancy 10/27/2016  . Type 2 diabetes mellitus affecting pregnancy, antepartum  10/27/2016  . Poor dentition 10/27/2016  . Supervision of high-risk pregnancy 10/26/2016  . History of miscarriage 05/18/2013    Current Outpatient Medications on File Prior to Visit  Medication Sig Dispense Refill  . b complex vitamins tablet Take 1 tablet by mouth 3 (three) times a week.    Marland Kitchen. HYDROcodone-acetaminophen (NORCO/VICODIN) 5-325 MG tablet Take 1 tablet by mouth every 6 (six) hours as needed for severe pain. 10 tablet 0  . Polyethylene Glycol 3350 (MIRALAX PO) Take by mouth as needed.    . Prenatal Vit-Fe Fumarate-FA (PRENATAL MULTIVITAMIN) TABS tablet Take 1 tablet by mouth daily.      No current facility-administered medications on file prior to visit.     Allergies  Allergen Reactions  . Shellfish Allergy Itching    ALL SHELLFISH  . Insulins Rash    Objective:  General: Alert and oriented x3 in no acute distress  Fiberglass cast intact to her left foot and leg with exposed toes with viable capillary fill time and mild blood to the left hallux nail that appears dry and stable with no acute signs of infection.  Assessment and Plan: Problem List Items Addressed This Visit    None    Visit Diagnoses    Closed fracture of left ankle, initial encounter    -  Primary   Acute left ankle pain           -Complete examination performed -Xrays reviewed from ER -Discussed treatement options and advised patient that she needs to  further discuss her concerns with Dr. Eulah Pont or his staff to get more exact answers on postoperative recovery and surgical versus nonsurgical options.  I advised patient that if she decides to have surgery she should consider other home assistance like a home aide or either help from family and friends.  Patient is supported today at this visit by her husband. -Answered all patient's questions and she thanked me for my time and my opinion -Patient to return to office as needed or sooner if condition worsens.  Asencion Islam, DPM

## 2018-07-26 ENCOUNTER — Other Ambulatory Visit: Payer: Self-pay

## 2018-07-26 ENCOUNTER — Ambulatory Visit (HOSPITAL_BASED_OUTPATIENT_CLINIC_OR_DEPARTMENT_OTHER): Payer: Medicare Other | Admitting: Anesthesiology

## 2018-07-26 ENCOUNTER — Encounter (HOSPITAL_BASED_OUTPATIENT_CLINIC_OR_DEPARTMENT_OTHER): Payer: Self-pay | Admitting: *Deleted

## 2018-07-26 ENCOUNTER — Ambulatory Visit (HOSPITAL_BASED_OUTPATIENT_CLINIC_OR_DEPARTMENT_OTHER)
Admission: RE | Admit: 2018-07-26 | Discharge: 2018-07-26 | Disposition: A | Discharge status: Home / Self Care | Payer: Medicare Other | Source: Ambulatory Visit | Attending: Orthopedic Surgery | Admitting: Orthopedic Surgery

## 2018-07-26 ENCOUNTER — Encounter (HOSPITAL_BASED_OUTPATIENT_CLINIC_OR_DEPARTMENT_OTHER)
Admission: RE | Disposition: A | Discharge status: Home / Self Care | Payer: Self-pay | Source: Ambulatory Visit | Attending: Orthopedic Surgery

## 2018-07-26 DIAGNOSIS — R011 Cardiac murmur, unspecified: Secondary | ICD-10-CM | POA: Insufficient documentation

## 2018-07-26 DIAGNOSIS — E119 Type 2 diabetes mellitus without complications: Secondary | ICD-10-CM | POA: Diagnosis not present

## 2018-07-26 DIAGNOSIS — G5603 Carpal tunnel syndrome, bilateral upper limbs: Secondary | ICD-10-CM | POA: Diagnosis not present

## 2018-07-26 DIAGNOSIS — Y939 Activity, unspecified: Secondary | ICD-10-CM | POA: Insufficient documentation

## 2018-07-26 DIAGNOSIS — S89302A Unspecified physeal fracture of lower end of left fibula, initial encounter for closed fracture: Secondary | ICD-10-CM | POA: Insufficient documentation

## 2018-07-26 DIAGNOSIS — Z8489 Family history of other specified conditions: Secondary | ICD-10-CM | POA: Diagnosis not present

## 2018-07-26 DIAGNOSIS — F419 Anxiety disorder, unspecified: Secondary | ICD-10-CM | POA: Diagnosis not present

## 2018-07-26 DIAGNOSIS — S82892A Other fracture of left lower leg, initial encounter for closed fracture: Secondary | ICD-10-CM | POA: Diagnosis present

## 2018-07-26 DIAGNOSIS — Z6841 Body Mass Index (BMI) 40.0 and over, adult: Secondary | ICD-10-CM | POA: Diagnosis not present

## 2018-07-26 DIAGNOSIS — F17210 Nicotine dependence, cigarettes, uncomplicated: Secondary | ICD-10-CM | POA: Diagnosis not present

## 2018-07-26 DIAGNOSIS — F329 Major depressive disorder, single episode, unspecified: Secondary | ICD-10-CM | POA: Insufficient documentation

## 2018-07-26 DIAGNOSIS — Z825 Family history of asthma and other chronic lower respiratory diseases: Secondary | ICD-10-CM | POA: Diagnosis not present

## 2018-07-26 DIAGNOSIS — Z833 Family history of diabetes mellitus: Secondary | ICD-10-CM | POA: Diagnosis not present

## 2018-07-26 DIAGNOSIS — Z79899 Other long term (current) drug therapy: Secondary | ICD-10-CM | POA: Diagnosis not present

## 2018-07-26 DIAGNOSIS — X58XXXA Exposure to other specified factors, initial encounter: Secondary | ICD-10-CM | POA: Insufficient documentation

## 2018-07-26 DIAGNOSIS — Z803 Family history of malignant neoplasm of breast: Secondary | ICD-10-CM | POA: Insufficient documentation

## 2018-07-26 HISTORY — DX: Personal history of other diseases of the female genital tract: Z87.42

## 2018-07-26 HISTORY — PX: ORIF ANKLE FRACTURE: SHX5408

## 2018-07-26 HISTORY — DX: Personal history of other complications of pregnancy, childbirth and the puerperium: Z87.59

## 2018-07-26 HISTORY — DX: Personal history of other specified conditions: Z87.898

## 2018-07-26 HISTORY — DX: Carpal tunnel syndrome, bilateral upper limbs: G56.03

## 2018-07-26 HISTORY — DX: Type 2 diabetes mellitus without complications: E11.9

## 2018-07-26 LAB — GLUCOSE, CAPILLARY: GLUCOSE-CAPILLARY: 174 mg/dL — AB (ref 70–99)

## 2018-07-26 LAB — POCT PREGNANCY, URINE: Preg Test, Ur: NEGATIVE

## 2018-07-26 LAB — POCT I-STAT 4, (NA,K, GLUC, HGB,HCT)
GLUCOSE: 152 mg/dL — AB (ref 70–99)
HEMATOCRIT: 39 % (ref 36.0–46.0)
Hemoglobin: 13.3 g/dL (ref 12.0–15.0)
POTASSIUM: 4 mmol/L (ref 3.5–5.1)
SODIUM: 140 mmol/L (ref 135–145)

## 2018-07-26 SURGERY — OPEN REDUCTION INTERNAL FIXATION (ORIF) ANKLE FRACTURE
Anesthesia: General | Site: Ankle | Laterality: Left

## 2018-07-26 MED ORDER — PROPOFOL 10 MG/ML IV BOLUS
INTRAVENOUS | Status: AC
  Filled 2018-07-26: qty 20

## 2018-07-26 MED ORDER — LACTATED RINGERS IV SOLN
INTRAVENOUS | Status: DC
  Administered 2018-07-26: 08:00:00 via INTRAVENOUS
  Filled 2018-07-26: qty 1000

## 2018-07-26 MED ORDER — KETOROLAC TROMETHAMINE 30 MG/ML IJ SOLN
INTRAMUSCULAR | Status: DC | PRN
  Administered 2018-07-26: 30 mg via INTRAVENOUS

## 2018-07-26 MED ORDER — FENTANYL CITRATE (PF) 100 MCG/2ML IJ SOLN
INTRAMUSCULAR | Status: AC
  Filled 2018-07-26: qty 2

## 2018-07-26 MED ORDER — LACTATED RINGERS IV SOLN
INTRAVENOUS | Status: DC
  Filled 2018-07-26: qty 1000

## 2018-07-26 MED ORDER — ONDANSETRON HCL 4 MG/2ML IJ SOLN
INTRAMUSCULAR | Status: DC | PRN
  Administered 2018-07-26: 4 mg via INTRAVENOUS

## 2018-07-26 MED ORDER — ONDANSETRON HCL 4 MG/2ML IJ SOLN
4.0000 mg | Freq: Four times a day (QID) | INTRAMUSCULAR | Status: DC | PRN
  Filled 2018-07-26: qty 2

## 2018-07-26 MED ORDER — OXYCODONE HCL 5 MG PO TABS
5.0000 mg | ORAL_TABLET | Freq: Once | ORAL | Status: DC | PRN
  Filled 2018-07-26: qty 1

## 2018-07-26 MED ORDER — BUPIVACAINE HCL (PF) 0.5 % IJ SOLN
INTRAMUSCULAR | Status: DC | PRN
  Administered 2018-07-26: 10 mL

## 2018-07-26 MED ORDER — DEXAMETHASONE SODIUM PHOSPHATE 10 MG/ML IJ SOLN
INTRAMUSCULAR | Status: DC | PRN
  Administered 2018-07-26: 10 mg via INTRAVENOUS

## 2018-07-26 MED ORDER — OXYCODONE HCL 5 MG PO TABS: 5.0000 mg | ORAL_TABLET | ORAL | 0 refills | Status: AC | PRN

## 2018-07-26 MED ORDER — ACETAMINOPHEN 500 MG PO TABS
ORAL_TABLET | ORAL | Status: AC
  Filled 2018-07-26: qty 2

## 2018-07-26 MED ORDER — CHLORHEXIDINE GLUCONATE 4 % EX LIQD
60.0000 mL | Freq: Once | CUTANEOUS | Status: DC
  Filled 2018-07-26: qty 118

## 2018-07-26 MED ORDER — FENTANYL CITRATE (PF) 100 MCG/2ML IJ SOLN
25.0000 ug | INTRAMUSCULAR | Status: DC | PRN
  Filled 2018-07-26: qty 1

## 2018-07-26 MED ORDER — GABAPENTIN 300 MG PO CAPS
ORAL_CAPSULE | ORAL | Status: AC
  Filled 2018-07-26: qty 1

## 2018-07-26 MED ORDER — MIDAZOLAM HCL 2 MG/2ML IJ SOLN
INTRAMUSCULAR | Status: AC
  Filled 2018-07-26: qty 2

## 2018-07-26 MED ORDER — ONDANSETRON HCL 4 MG/2ML IJ SOLN
INTRAMUSCULAR | Status: AC
  Filled 2018-07-26: qty 2

## 2018-07-26 MED ORDER — SUCCINYLCHOLINE CHLORIDE 200 MG/10ML IV SOSY
PREFILLED_SYRINGE | INTRAVENOUS | Status: AC
  Filled 2018-07-26: qty 10

## 2018-07-26 MED ORDER — ACETAMINOPHEN 500 MG PO TABS: 1000.0000 mg | ORAL_TABLET | Freq: Three times a day (TID) | ORAL | 0 refills | Status: AC

## 2018-07-26 MED ORDER — ASPIRIN EC 81 MG PO TBEC: 81.0000 mg | DELAYED_RELEASE_TABLET | Freq: Two times a day (BID) | ORAL | 0 refills | Status: AC

## 2018-07-26 MED ORDER — CEFAZOLIN SODIUM-DEXTROSE 2-4 GM/100ML-% IV SOLN
2.0000 g | INTRAVENOUS | Status: AC
  Administered 2018-07-26: 2 g via INTRAVENOUS
  Filled 2018-07-26: qty 100

## 2018-07-26 MED ORDER — ONDANSETRON HCL 4 MG PO TABS: 4.0000 mg | ORAL_TABLET | Freq: Three times a day (TID) | ORAL | 0 refills | Status: DC | PRN

## 2018-07-26 MED ORDER — METHOCARBAMOL 500 MG PO TABS: 500.0000 mg | ORAL_TABLET | Freq: Four times a day (QID) | ORAL | 0 refills | Status: DC | PRN

## 2018-07-26 MED ORDER — DEXAMETHASONE SODIUM PHOSPHATE 10 MG/ML IJ SOLN
INTRAMUSCULAR | Status: AC
  Filled 2018-07-26: qty 1

## 2018-07-26 MED ORDER — GABAPENTIN 300 MG PO CAPS: 300.0000 mg | ORAL_CAPSULE | Freq: Two times a day (BID) | ORAL | 0 refills | Status: DC

## 2018-07-26 MED ORDER — FENTANYL CITRATE (PF) 100 MCG/2ML IJ SOLN
INTRAMUSCULAR | Status: DC | PRN
  Administered 2018-07-26: 50 ug via INTRAVENOUS
  Administered 2018-07-26: 100 ug via INTRAVENOUS

## 2018-07-26 MED ORDER — MIDAZOLAM HCL 5 MG/5ML IJ SOLN
INTRAMUSCULAR | Status: DC | PRN
  Administered 2018-07-26: 2 mg via INTRAVENOUS

## 2018-07-26 MED ORDER — CEFAZOLIN SODIUM-DEXTROSE 2-4 GM/100ML-% IV SOLN
INTRAVENOUS | Status: AC
  Filled 2018-07-26: qty 100

## 2018-07-26 MED ORDER — LIDOCAINE 2% (20 MG/ML) 5 ML SYRINGE
INTRAMUSCULAR | Status: DC | PRN
  Administered 2018-07-26: 60 mg via INTRAVENOUS

## 2018-07-26 MED ORDER — SUCCINYLCHOLINE CHLORIDE 200 MG/10ML IV SOSY
PREFILLED_SYRINGE | INTRAVENOUS | Status: DC | PRN
  Administered 2018-07-26: 120 mg via INTRAVENOUS

## 2018-07-26 MED ORDER — GABAPENTIN 300 MG PO CAPS
300.0000 mg | ORAL_CAPSULE | Freq: Once | ORAL | Status: AC
  Administered 2018-07-26: 300 mg via ORAL
  Filled 2018-07-26: qty 1

## 2018-07-26 MED ORDER — PROPOFOL 10 MG/ML IV BOLUS
INTRAVENOUS | Status: DC | PRN
  Administered 2018-07-26: 180 mg via INTRAVENOUS
  Administered 2018-07-26: 100 mg via INTRAVENOUS
  Administered 2018-07-26: 20 mg via INTRAVENOUS

## 2018-07-26 MED ORDER — MIDAZOLAM HCL 5 MG/5ML IJ SOLN: INTRAMUSCULAR | Status: DC | PRN

## 2018-07-26 MED ORDER — LIDOCAINE 2% (20 MG/ML) 5 ML SYRINGE
INTRAMUSCULAR | Status: AC
  Filled 2018-07-26: qty 5

## 2018-07-26 MED ORDER — BUPIVACAINE-EPINEPHRINE (PF) 0.5% -1:200000 IJ SOLN
INTRAMUSCULAR | Status: DC | PRN
  Administered 2018-07-26: 15 mL via PERINEURAL
  Administered 2018-07-26: 25 mL via PERINEURAL

## 2018-07-26 MED ORDER — OXYCODONE HCL 5 MG/5ML PO SOLN
5.0000 mg | Freq: Once | ORAL | Status: DC | PRN
  Filled 2018-07-26: qty 5

## 2018-07-26 MED ORDER — ACETAMINOPHEN 500 MG PO TABS
1000.0000 mg | ORAL_TABLET | Freq: Once | ORAL | Status: AC
  Administered 2018-07-26: 1000 mg via ORAL
  Filled 2018-07-26: qty 2

## 2018-07-26 SURGICAL SUPPLY — 74 items
BANDAGE ACE 4X5 VEL STRL LF (GAUZE/BANDAGES/DRESSINGS) ×3 IMPLANT
BANDAGE ACE 6X5 VEL STRL LF (GAUZE/BANDAGES/DRESSINGS) ×3 IMPLANT
BANDAGE ESMARK 6X9 LF (GAUZE/BANDAGES/DRESSINGS) ×1 IMPLANT
BIT DRILL 3.5X122MM AO FIT (BIT) ×2 IMPLANT
BLADE SURG 15 STRL LF DISP TIS (BLADE) ×2 IMPLANT
BLADE SURG 15 STRL SS (BLADE) ×6
BNDG CMPR 9X6 STRL LF SNTH (GAUZE/BANDAGES/DRESSINGS) ×1
BNDG COHESIVE 4X5 TAN STRL (GAUZE/BANDAGES/DRESSINGS) ×3 IMPLANT
BNDG ESMARK 6X9 LF (GAUZE/BANDAGES/DRESSINGS) ×3
CHLORAPREP W/TINT 26ML (MISCELLANEOUS) ×3 IMPLANT
CLOSURE STERI-STRIP 1/2X4 (GAUZE/BANDAGES/DRESSINGS) ×1
CLSR STERI-STRIP ANTIMIC 1/2X4 (GAUZE/BANDAGES/DRESSINGS) ×2 IMPLANT
COVER BACK TABLE 60X90IN (DRAPES) ×3 IMPLANT
CUFF TOURNIQUET SINGLE 24IN (TOURNIQUET CUFF) IMPLANT
CUFF TOURNIQUET SINGLE 34IN LL (TOURNIQUET CUFF) ×2 IMPLANT
DECANTER SPIKE VIAL GLASS SM (MISCELLANEOUS) IMPLANT
DRAPE EXTREMITY T 121X128X90 (DRAPE) ×3 IMPLANT
DRAPE IMP U-DRAPE 54X76 (DRAPES) ×3 IMPLANT
DRAPE OEC MINIVIEW 54X84 (DRAPES) ×3 IMPLANT
DRAPE U-SHAPE 47X51 STRL (DRAPES) ×3 IMPLANT
DRILL 2.6X122MM WL AO SHAFT (BIT) ×2 IMPLANT
DRSG EMULSION OIL 3X3 NADH (GAUZE/BANDAGES/DRESSINGS) ×3 IMPLANT
DRSG PAD ABDOMINAL 8X10 ST (GAUZE/BANDAGES/DRESSINGS) ×3 IMPLANT
ELECT REM PT RETURN 9FT ADLT (ELECTROSURGICAL) ×3
ELECTRODE REM PT RTRN 9FT ADLT (ELECTROSURGICAL) ×1 IMPLANT
GAUZE SPONGE 4X4 12PLY STRL (GAUZE/BANDAGES/DRESSINGS) ×3 IMPLANT
GLOVE BIO SURGEON STRL SZ7.5 (GLOVE) ×6 IMPLANT
GLOVE BIOGEL PI IND STRL 8 (GLOVE) ×2 IMPLANT
GLOVE BIOGEL PI INDICATOR 8 (GLOVE) ×4
GOWN STRL REUS W/ TWL LRG LVL3 (GOWN DISPOSABLE) ×2 IMPLANT
GOWN STRL REUS W/ TWL XL LVL3 (GOWN DISPOSABLE) ×1 IMPLANT
GOWN STRL REUS W/TWL LRG LVL3 (GOWN DISPOSABLE) ×6
GOWN STRL REUS W/TWL XL LVL3 (GOWN DISPOSABLE) ×3
NEEDLE HYPO 22GX1.5 SAFETY (NEEDLE) ×2 IMPLANT
NS IRRIG 1000ML POUR BTL (IV SOLUTION) ×3 IMPLANT
PACK BASIN DAY SURGERY FS (CUSTOM PROCEDURE TRAY) ×3 IMPLANT
PAD CAST 4YDX4 CTTN HI CHSV (CAST SUPPLIES) ×1 IMPLANT
PADDING CAST ABS 4INX4YD NS (CAST SUPPLIES) ×4
PADDING CAST ABS COTTON 4X4 ST (CAST SUPPLIES) ×2 IMPLANT
PADDING CAST COTTON 4X4 STRL (CAST SUPPLIES) ×3
PADDING CAST COTTON 6X4 STRL (CAST SUPPLIES) ×3 IMPLANT
PENCIL BUTTON HOLSTER BLD 10FT (ELECTRODE) ×3 IMPLANT
PLATE TUBULAR 1/3 5H (Plate) ×2 IMPLANT
SCREW CANC 2.5XFT HEX12X4X (Screw) IMPLANT
SCREW CANCELLOUS 4.0X12MM (Screw) ×6 IMPLANT
SCREW CORTEX ST MATTA 3.5X12MM (Screw) ×2 IMPLANT
SCREW CORTEX ST MATTA 3.5X14 (Screw) ×2 IMPLANT
SCREW CORTEX ST MATTA 3.5X16MM (Screw) ×2 IMPLANT
SCREW CORTEX ST MATTA 3.5X18MM (Screw) ×2 IMPLANT
SCREW CORTEX ST MATTA 3.5X22MM (Screw) ×2 IMPLANT
SLEEVE SCD COMPRESS KNEE MED (MISCELLANEOUS) IMPLANT
SPLINT FAST PLASTER 5X30 (CAST SUPPLIES) ×2
SPLINT PLASTER CAST FAST 5X30 (CAST SUPPLIES) ×20 IMPLANT
SPONGE LAP 4X18 RFD (DISPOSABLE) ×3 IMPLANT
SUCTION FRAZIER HANDLE 10FR (MISCELLANEOUS) ×2
SUCTION TUBE FRAZIER 10FR DISP (MISCELLANEOUS) ×1 IMPLANT
SUT ETHILON 3 0 PS 1 (SUTURE) IMPLANT
SUT MNCRL AB 4-0 PS2 18 (SUTURE) IMPLANT
SUT MON AB 2-0 CT1 36 (SUTURE) IMPLANT
SUT MON AB 2-0 SH 27 (SUTURE) ×3
SUT MON AB 2-0 SH27 (SUTURE) IMPLANT
SUT MON AB 3-0 SH 27 (SUTURE)
SUT MON AB 3-0 SH27 (SUTURE) IMPLANT
SUT VIC AB 0 SH 27 (SUTURE) ×3 IMPLANT
SUT VIC AB 2-0 SH 27 (SUTURE)
SUT VIC AB 2-0 SH 27XBRD (SUTURE) IMPLANT
SYR BULB 3OZ (MISCELLANEOUS) ×3 IMPLANT
SYR CONTROL 10ML LL (SYRINGE) IMPLANT
TOWEL OR NON WOVEN STRL DISP B (DISPOSABLE) ×3 IMPLANT
TUBE CONNECTING 12'X1/4 (SUCTIONS) ×1
TUBE CONNECTING 12X1/4 (SUCTIONS) ×2 IMPLANT
UNDERPAD 30X30 (UNDERPADS AND DIAPERS) ×3 IMPLANT
drillbit ×2 IMPLANT
overdrill ×2 IMPLANT

## 2018-07-26 NOTE — Anesthesia Procedure Notes (Signed)
Anesthesia Regional Block: Adductor canal block   Pre-Anesthetic Checklist: ,, timeout performed, Correct Patient, Correct Site, Correct Laterality, Correct Procedure, Correct Position, site marked, Risks and benefits discussed,  Surgical consent,  Pre-op evaluation,  At surgeon's request and post-op pain management  Laterality: Left  Prep: chloraprep       Needles:  Injection technique: Single-shot  Needle Type: Echogenic Needle     Needle Length: 9cm  Needle Gauge: 21     Additional Needles:   Narrative:  Start time: 07/26/2018 8:04 AM End time: 07/26/2018 8:08 AM Injection made incrementally with aspirations every 5 mL.  Performed by: Personally  Anesthesiologist: Achille RichHodierne, Keeanna Villafranca, MD  Additional Notes: Pt tolerated the procedure well.

## 2018-07-26 NOTE — H&P (Signed)
ORTHOPAEDIC CONSULTATION  REQUESTING PHYSICIAN: Renette Butters, MD  Chief Complaint: left ankle injury  HPI: Kathryn Mayo is a 32 y.o. female who complains of a mechanical fall and L ankle pain. She has been in a boot and elevating.   Past Medical History:  Diagnosis Date  . Carpal tunnel syndrome on both sides   . Diabetes mellitus type 2, diet-controlled ([place])    followed by pcp  . History of abnormal cervical Pap smear   . History of cardiac murmur as a child   . History of fetal demise, not currently pregnant 2002   Past Surgical History:  Procedure Laterality Date  . CESAREAN SECTION  2005;  05-26-2006  . CESAREAN SECTION N/A 04/30/2017   Procedure: CESAREAN SECTION;  Surgeon: Donnamae Jude, MD;  Location: [place];  Service: Obstetrics;  Laterality: N/A;  . WISDOM TOOTH EXTRACTION  2009   Social History   Socioeconomic History  . Marital status: Married    Spouse name: Not on file  . Number of children: Not on file  . Years of education: Not on file  . Highest education level: Not on file  Occupational History  . Not on file  Social Needs  . Financial resource strain: Not on file  . Food insecurity:    Worry: Not on file    Inability: Not on file  . Transportation needs:    Medical: Not on file    Non-medical: Not on file  Tobacco Use  . Smoking status: Current Every Day Smoker    Packs/day: 0.50    Years: 15.00    Pack years: 7.50    Types: Cigarettes  . Smokeless tobacco: Never Used  Substance and Sexual Activity  . Alcohol use: Yes    Comment: very rare  . Drug use: Not Currently  . Sexual activity: Yes    Birth control/protection: None  Lifestyle  . Physical activity:    Days per week: Not on file    Minutes per session: Not on file  . Stress: Not on file  Relationships  . Social connections:    Talks on phone: Not on file    Gets together: Not on file    Attends religious service: Not on file    Active member of  club or organization: Not on file    Attends meetings of clubs or organizations: Not on file    Relationship status: Not on file  Other Topics Concern  . Not on file  Social History Narrative  . Not on file   Family History  Problem Relation Age of Onset  . Asthma Mother   . Drug abuse Mother        died of overdose  . HIV/AIDS Mother        34  . Asthma Daughter   . Diabetes Maternal Grandmother   . Cancer Maternal Grandmother        breast   Allergies  Allergen Reactions  . Shellfish Allergy Itching    ALL SHELLFISH  . Insulins Rash   Prior to Admission medications   Medication Sig Start Date End Date Taking? Authorizing Provider  b complex vitamins tablet Take 1 tablet by mouth 3 (three) times a week.   Yes [provider]  HYDROcodone-acetaminophen (NORCO/VICODIN) 5-325 MG tablet Take 1 tablet by mouth every 6 (six) hours as needed for severe pain. 07/19/18  Yes Lorin Glass, PA-C  Polyethylene Glycol 3350 (MIRALAX PO)  Take by mouth as needed.   Yes [provider]  Prenatal Vit-Fe Fumarate-FA (PRENATAL MULTIVITAMIN) TABS tablet Take 1 tablet by mouth daily.    Yes [provider]   No results found.  Positive ROS: All other systems have been reviewed and were otherwise negative with the exception of those mentioned in the HPI and as above.  Labs cbc No results for input(s): WBC, HGB, HCT, PLT in the last 72 hours.  Labs inflam No results for input(s): CRP in the last 72 hours.  Invalid input(s): ESR  Labs coag No results for input(s): INR, PTT in the last 72 hours.  Invalid input(s): PT  No results for input(s): NA, K, CL, CO2, GLUCOSE, BUN, CREATININE, CALCIUM in the last 72 hours.  Physical Exam: Vitals:   07/26/18 0706  BP: 128/72  Pulse: 81  Resp: 18  Temp: 97.8 F (36.6 C)  SpO2: 98%   General: Alert, no acute distress Cardiovascular: No pedal edema Respiratory: No cyanosis, no use of accessory  musculature GI: No organomegaly, abdomen is soft and non-tender Skin: No lesions in the area of chief complaint other than those listed below in MSK exam.  Neurologic: Sensation intact distally save for the below mentioned MSK exam Psychiatric: Patient is competent for consent with normal mood and affect Lymphatic: No axillary or cervical lymphadenopathy  MUSCULOSKELETAL:  LLE: NVI, compartments soft Other extremities are atraumatic with painless ROM and NVI.  Assessment: Left ankle fracture  Plan: ORIF today   Renette Butters, MD Cell [phone]   07/26/2018 7:14 AM

## 2018-07-26 NOTE — Discharge Instructions (Signed)
Elevate leg - Toes above nose as much as possible to reduce pain / swelling. If needed, you may increase pain medication for the first few days post op to 2 tablets every 4 hours.  You may loosen and re-apply ace wrap if it feels too tight.  Weight Bearing:  Non weight bearing affected leg.  Diet: As you were doing prior to hospitalization   Shower:  You have a splint on, leave the splint in place and keep the splint dry with a plastic bag.  Dressing:  You have a splint. Leave the splint in place and we will change your bandages during your first follow-up appointment.    Activity:  Increase activity slowly as tolerated, but follow the weight bearing instructions below.  The rules on driving is that you can not be taking narcotics while you drive, and you must feel in control of the vehicle.    To prevent constipation:  Narcotic medicines cause constipation.  Wean these as soon as is appropriate.   You may use a stool softener such as -  Colace (over the counter) 100 mg by mouth twice a day  Drink plenty of fluids (prune juice may be helpful) and high fiber foods Miralax (over the counter) for constipation as needed.    Itching:  If you experience itching with your medications, try taking only a single pain pill, or even half a pain pill at a time.  You can also use benadryl over the counter for itching or also to help with sleep.   Precautions:  If you experience chest pain or shortness of breath - call 911 immediately for transfer to the hospital emergency department!!  If you develop a fever greater that 101 F, purulent drainage from wound, increased redness or drainage from wound, or calf pain -- Call the office at [phone]                                                 Follow- Up Appointment:  Please call for an appointment to be seen in 2 weeks [place] - [phone]  Call your surgeon if you experience:   1.  Fever over 101.0. 2.  Inability to urinate. 3.   Nausea and/or vomiting. 4.  Extreme swelling or bruising at the surgical site. 5.  Continued bleeding from the incision. 6.  Increased pain, redness or drainage from the incision. 7.  Problems related to your pain medication. 8.  Any problems and/or concerns  Post Anesthesia Home Care Instructions  Activity: Get plenty of rest for the remainder of the day. A responsible individual must stay with you for 24 hours following the procedure.  For the next 24 hours, DO NOT: -Drive a car -Advertising copywriterperate machinery -Drink alcoholic beverages -Take any medication unless instructed by your physician -Make any legal decisions or sign important papers.  Meals: Start with liquid foods such as gelatin or soup. Progress to regular foods as tolerated. Avoid greasy, spicy, heavy foods. If nausea and/or vomiting occur, drink only clear liquids until the nausea and/or vomiting subsides. Call your physician if vomiting continues.  Special Instructions/Symptoms: Your throat may feel dry or sore from the anesthesia or the breathing tube placed in your throat during surgery. If this causes discomfort, gargle with warm salt water. The discomfort should disappear within 24 hours.  If you had a  scopolamine patch placed behind your ear for the management of post- operative nausea and/or vomiting:  1. The medication in the patch is effective for 72 hours, after which it should be removed.  Wrap patch in a tissue and discard in the trash. Wash hands thoroughly with soap and water. 2. You may remove the patch earlier than 72 hours if you experience unpleasant side effects which may include dry mouth, dizziness or visual disturbances. 3. Avoid touching the patch. Wash your hands with soap and water after contact with the patch.   NO IBUPROFEN PRODUCTS (MOTRIN, ADVIL) OR ALEVE UNTIL 3:30PM TODAY.   Call your surgeon if you experience:   1.  Fever over 101.0. 2.  Inability to urinate. 3.  Nausea and/or vomiting. 4.   Extreme swelling or bruising at the surgical site. 5.  Continued bleeding from the incision. 6.  Increased pain, redness or drainage from the incision. 7.  Problems related to your pain medication. 8.  Any problems and/or concerns

## 2018-07-26 NOTE — Interval H&P Note (Signed)
History and Physical Interval Note:  07/26/2018 7:15 AM  Kathryn Mayo  has presented today for surgery, with the diagnosis of LEFT ANKLE FRACTURE  The various methods of treatment have been discussed with the patient and family. After consideration of risks, benefits and other options for treatment, the patient has consented to  Procedure(s): OPEN REDUCTION INTERNAL FIXATION (ORIF) LEFT ANKLE FRACTURE (Left) as a surgical intervention .  The patient's history has been reviewed, patient examined, no change in status, stable for surgery.  I have reviewed the patient's chart and labs.  Questions were answered to the patient's satisfaction.     Sheral Apleyimothy D Crayton Savarese

## 2018-07-26 NOTE — Anesthesia Procedure Notes (Signed)
Procedure Name: Intubation Date/Time: 07/26/2018 9:01 AM Performed by: Bonney Aid, CRNA Pre-anesthesia Checklist: Patient identified, Emergency Drugs available, Suction available and Patient being monitored Patient Re-evaluated:Patient Re-evaluated prior to induction Oxygen Delivery Method: Circle system utilized Preoxygenation: Pre-oxygenation with 100% oxygen Induction Type: IV induction Ventilation: Mask ventilation without difficulty Laryngoscope Size: Mac and 3 Grade View: Grade I Tube type: Oral Tube size: 7.0 mm Number of attempts: 1 Airway Equipment and Method: Stylet and Oral airway Placement Confirmation: ETT inserted through vocal cords under direct vision,  positive ETCO2 and breath sounds checked- equal and bilateral Secured at: 21 cm Tube secured with: Tape Dental Injury: Teeth and Oropharynx as per pre-operative assessment

## 2018-07-26 NOTE — Op Note (Signed)
07/26/2018  10:00 AM  PATIENT:  Kathryn Mayo    PRE-OPERATIVE DIAGNOSIS:  LEFT ANKLE FRACTURE  POST-OPERATIVE DIAGNOSIS:  Same  PROCEDURE:  OPEN REDUCTION INTERNAL FIXATION (ORIF) LEFT ANKLE FRACTURE  SURGEON:  Sheral Apleyimothy D Murphy, MD  ASSISTANT: Aquilla HackerHenry Martensen, PA-C, he was present and scrubbed throughout the case, critical for completion in a timely fashion, and for retraction, instrumentation, and closure.   ANESTHESIA:   gen  PREOPERATIVE INDICATIONS:  Kathryn Mayo is a  32 y.o. female with a diagnosis of LEFT ANKLE FRACTURE who failed conservative measures and elected for surgical management.    The risks benefits and alternatives were discussed with the patient preoperatively including but not limited to the risks of infection, bleeding, nerve injury, cardiopulmonary complications, the need for revision surgery, among others, and the patient was willing to proceed.  OPERATIVE IMPLANTS: stryker lat plate  OPERATIVE FINDINGS: Unstable ankle fracture. Stable syndesmosis post op  BLOOD LOSS: min  COMPLICATIONS: none  TOURNIQUET TIME: 30min  OPERATIVE PROCEDURE:  Patient was identified in the preoperative holding area and site was marked by me He was transported to the operating theater and placed on the table in supine position taking care to pad all bony prominences. After a preincinduction time out anesthesia was induced. The left lower extremity was prepped and draped in normal sterile fashion and a pre-incision timeout was performed. Kathryn Mayo received ancef for preoperative antibiotics.   I made a lateral incision of roughly 7 cm dissection was carried down sharply to the distal fibula and then spreading dissection was used proximally to protect the superficial peroneal nerve. I sharply incised the periosteum and took care to protect the peroneal tendons. I then debrided the fracture site and performed a reduction maneuver which was held in place with a clamp.   I  placed a lag screw across the fracture  I then selected a 5-hole one third tubular plate and placed in a neutralization fashion care was taken distally so as not to penetrate the joint with the cancellus screws.  I then stressed the syndesmosis and it was stable  The wound was then thoroughly irrigated and closed using a 0 Vicryl and absorbable Monocryl sutures. He was placed in a short leg splint.   POST OPERATIVE PLAN: Non-weightbearing. DVT prophylaxis will consist of mobilization and ASA

## 2018-07-26 NOTE — Anesthesia Postprocedure Evaluation (Signed)
Anesthesia Post Note  Patient: Lenoria Farriererell M Lipke  Procedure(s) Performed: OPEN REDUCTION INTERNAL FIXATION (ORIF) LEFT ANKLE FRACTURE (Left Ankle)     Patient location during evaluation: PACU Anesthesia Type: General Level of consciousness: awake and alert Pain management: pain level controlled Vital Signs Assessment: post-procedure vital signs reviewed and stable Respiratory status: spontaneous breathing, nonlabored ventilation, respiratory function stable and patient connected to nasal cannula oxygen Cardiovascular status: blood pressure returned to baseline and stable Postop Assessment: no apparent nausea or vomiting Anesthetic complications: no    Last Vitals:  Vitals:   07/26/18 1100 07/26/18 1145  BP: 126/79 96/63  Pulse: 60 (!) 55  Resp: 15 16  Temp:  36.8 C  SpO2: 97% 100%    Last Pain:  Vitals:   07/26/18 1145  TempSrc: Axillary  PainSc: 0-No pain                 Maudy Yonan S

## 2018-07-26 NOTE — Transfer of Care (Signed)
Immediate Anesthesia Transfer of Care Note  Patient: Lenoria Farriererell M Venti  Procedure(s) Performed: OPEN REDUCTION INTERNAL FIXATION (ORIF) LEFT ANKLE FRACTURE (Left Ankle)  Patient Location: PACU  Anesthesia Type:General  Level of Consciousness: drowsy  Airway & Oxygen Therapy: Patient Spontanous Breathing and Patient connected to nasal cannula oxygen  Post-op Assessment: Report given to RN  Post vital signs: Reviewed and stable  Last Vitals:  Vitals Value Taken Time  BP 141/78 07/26/2018 10:01 AM  Temp    Pulse 86 07/26/2018 10:03 AM  Resp 19 07/26/2018 10:03 AM  SpO2 99 % 07/26/2018 10:03 AM  Vitals shown include unvalidated device data.  Last Pain:  Vitals:   07/26/18 0759  TempSrc:   PainSc: 0-No pain      Patients Stated Pain Goal: 8 (07/26/18 0759)  Complications: No apparent anesthesia complications

## 2018-07-26 NOTE — Anesthesia Procedure Notes (Signed)
Anesthesia Regional Block: Popliteal block   Pre-Anesthetic Checklist: ,, timeout performed, Correct Patient, Correct Site, Correct Laterality, Correct Procedure, Correct Position, site marked, Risks and benefits discussed,  Surgical consent,  Pre-op evaluation,  At surgeon's request and post-op pain management  Laterality: Left  Prep: chloraprep       Needles:  Injection technique: Single-shot  Needle Type: Echogenic Stimulator Needle          Additional Needles:   Procedures:, nerve stimulator,,,,,,,   Nerve Stimulator or Paresthesia:  Response: plantar flexion of foot, 0.45 mA,   Additional Responses:   Narrative:  Start time: 07/26/2018 7:56 AM End time: 07/26/2018 8:03 AM Injection made incrementally with aspirations every 5 mL.  Performed by: Personally  Anesthesiologist: Achille RichHodierne, Branae Crail, MD  Additional Notes: Functioning IV was confirmed and monitors were applied.  A 90mm 21ga Arrow echogenic stimulator needle was used. Sterile prep and drape,hand hygiene and sterile gloves were used.  Negative aspiration and negative test dose prior to incremental administration of local anesthetic. The patient tolerated the procedure well.  Ultrasound guidance: relevent anatomy identified, needle position confirmed, local anesthetic spread visualized around nerve(s), vascular puncture avoided.  Image printed for medical record.

## 2018-07-26 NOTE — Anesthesia Preprocedure Evaluation (Signed)
Anesthesia Evaluation  Patient identified by MRN, date of birth, ID band Patient awake    Reviewed: Allergy & Precautions, H&P , NPO status , Patient's Chart, lab work & pertinent test results  Airway Mallampati: II   Neck ROM: full    Dental   Pulmonary Current Smoker,    breath sounds clear to auscultation       Cardiovascular negative cardio ROS   Rhythm:regular Rate:Normal     Neuro/Psych PSYCHIATRIC DISORDERS Anxiety Depression  Neuromuscular disease    GI/Hepatic   Endo/Other  diabetes, Type obesity  Renal/GU      Musculoskeletal   Abdominal   Peds  Hematology   Anesthesia Other Findings   Reproductive/Obstetrics                             Anesthesia Physical Anesthesia Plan  ASA: II  Anesthesia Plan: General   Post-op Pain Management:  Regional for Post-op pain   Induction: Intravenous  PONV Risk Score and Plan: 2 and Ondansetron, Dexamethasone, Midazolam and Treatment may vary due to age or medical condition  Airway Management Planned: Oral ETT  Additional Equipment:   Intra-op Plan:   Post-operative Plan: Extubation in OR  Informed Consent: I have reviewed the patients History and Physical, chart, labs and discussed the procedure including the risks, benefits and alternatives for the proposed anesthesia with the patient or authorized representative who has indicated his/her understanding and acceptance.     Plan Discussed with: CRNA, Anesthesiologist and Surgeon  Anesthesia Plan Comments:         Anesthesia Quick Evaluation

## 2018-07-27 ENCOUNTER — Encounter (HOSPITAL_BASED_OUTPATIENT_CLINIC_OR_DEPARTMENT_OTHER): Payer: Self-pay | Admitting: Orthopedic Surgery

## 2018-11-30 IMAGING — US US MFM FETAL BPP W/O NON-STRESS
1 series · 13 of 28 positions shown · non-contrast
Comparison: none

[Series 1: us mfm fetal bpp w/o non-stress · 31 acquisitions, 13 frames shown]
[im 2/31]
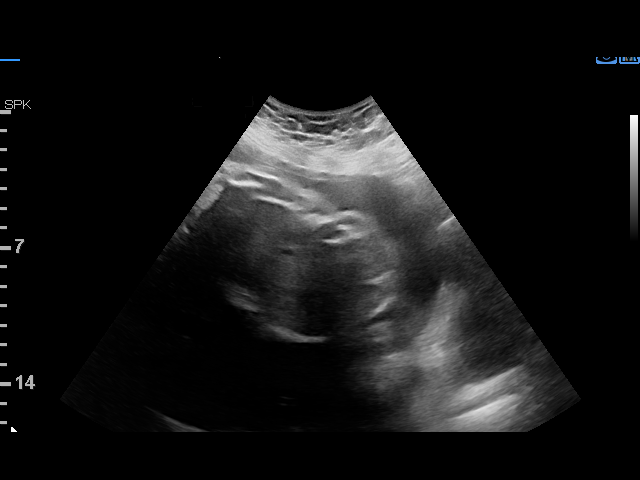
[im 4/31]
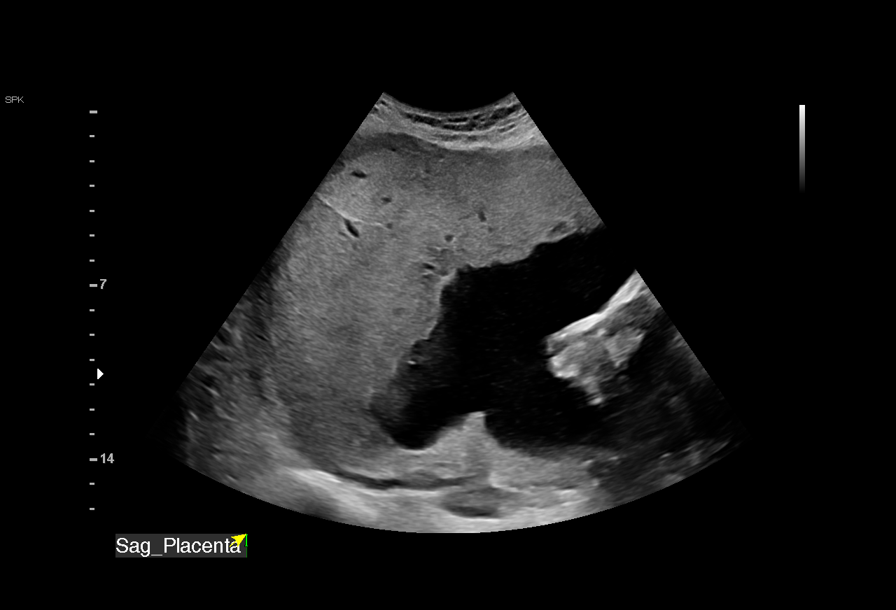
[im 6/31]
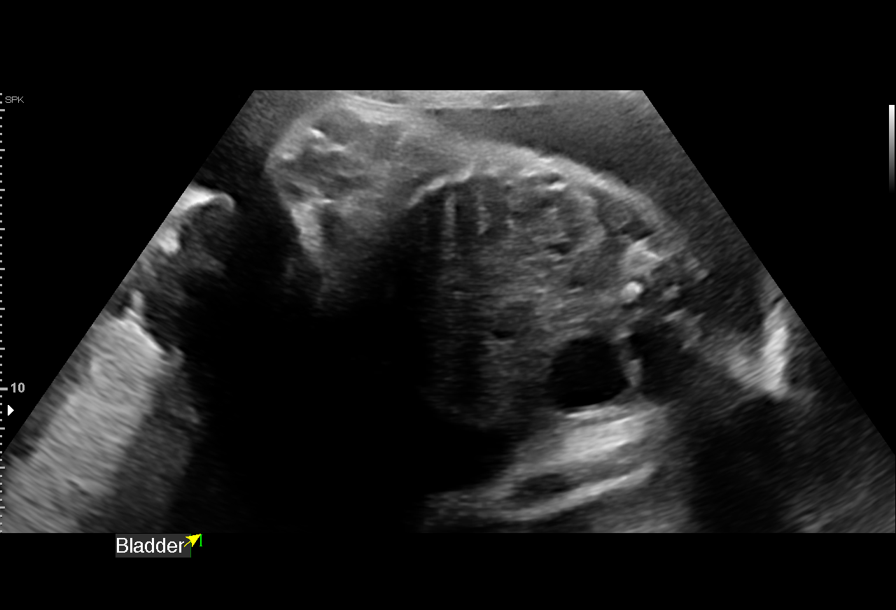
[im 8/31]
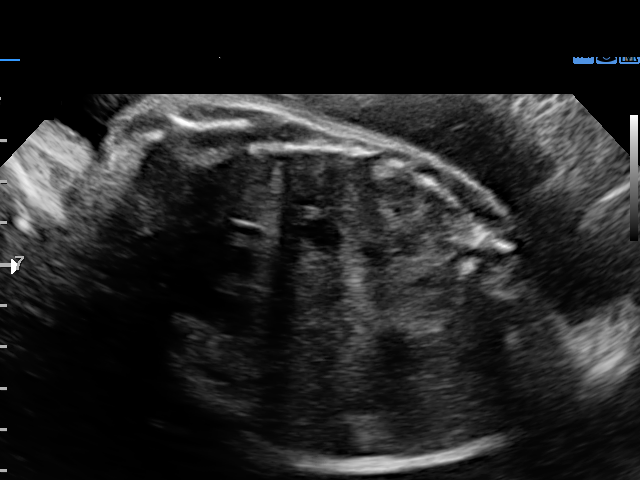
[im 11/31]
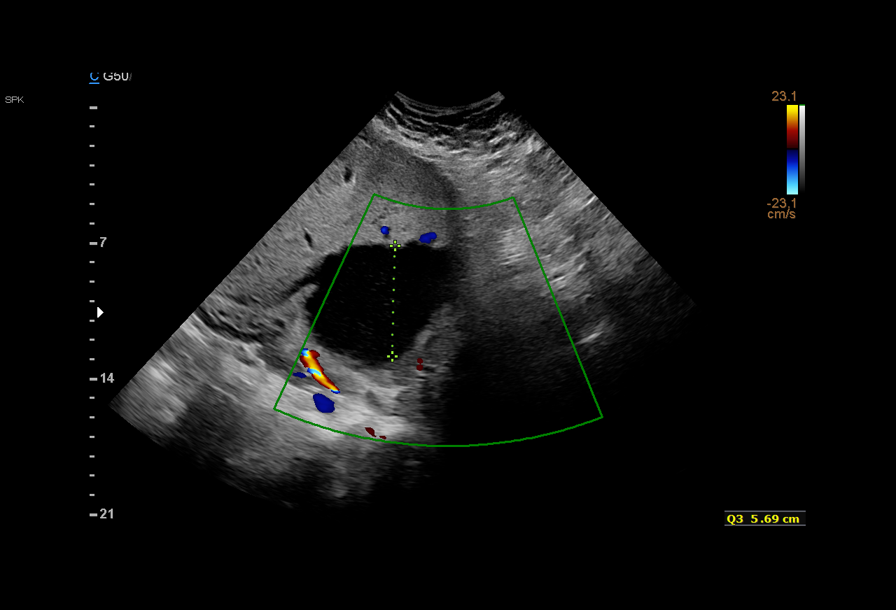
[im 13/31]
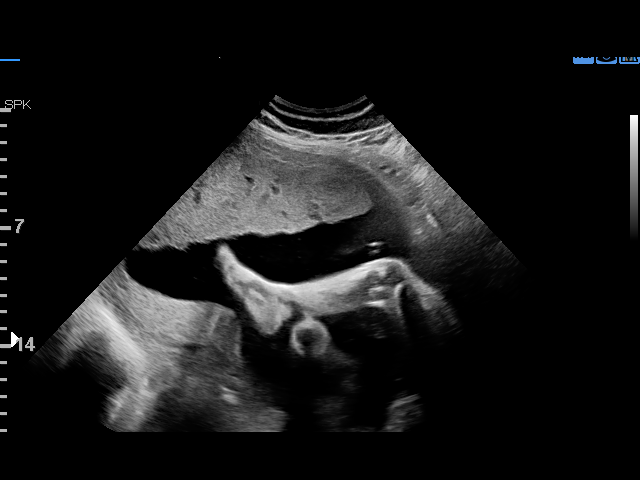
[im 16/31]
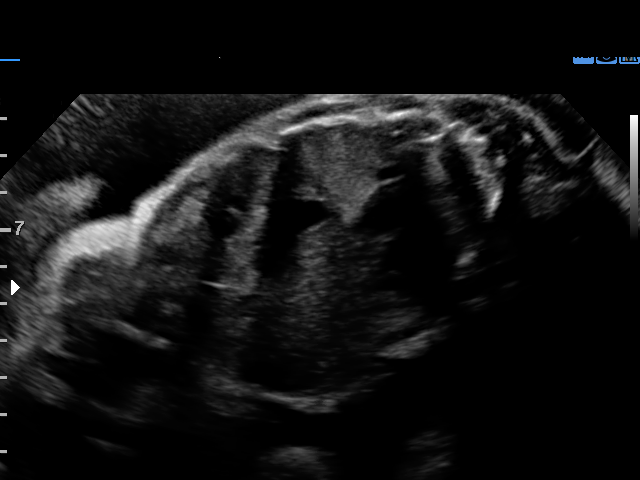
[im 18/31]
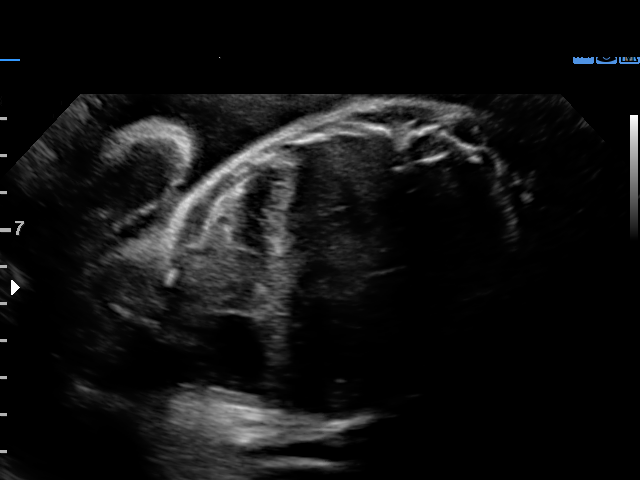
[im 21/31]
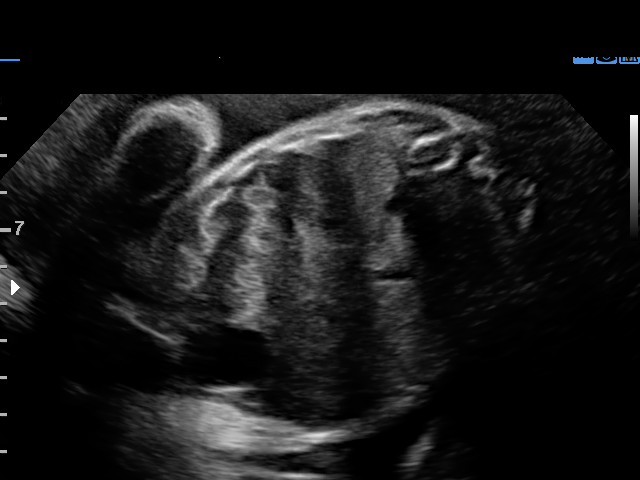
[im 23/31]
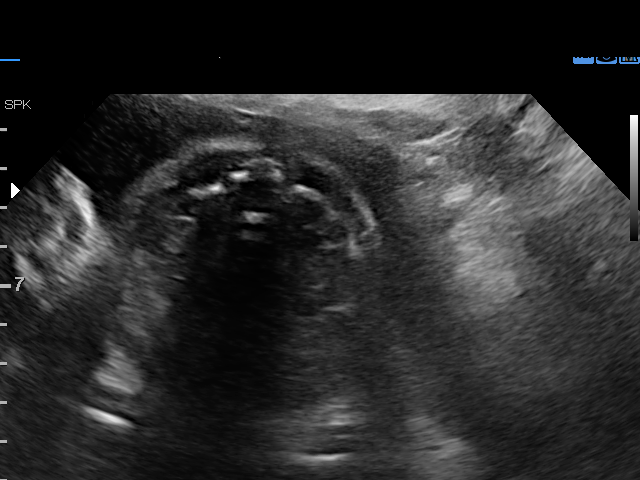
[im 25/31]
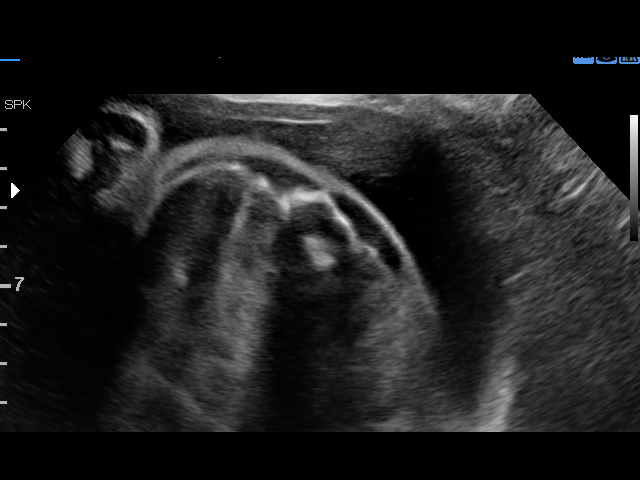
[im 27/31]
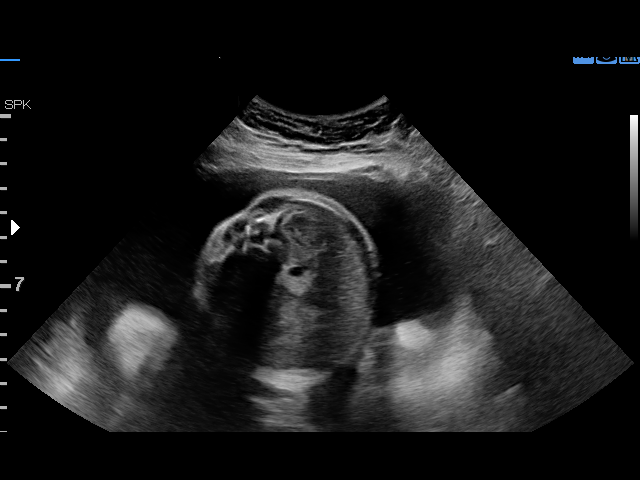
[im 29/31]
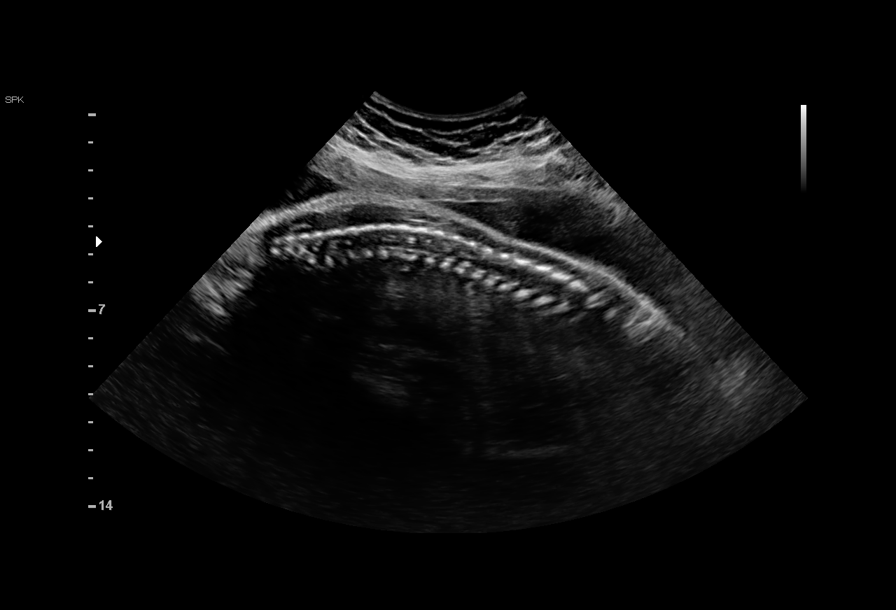

[13 of 28 positions shown; findings below may reference images not displayed]

1  AGATHA GANDARA           [id number]      [id number]     [id number]
Indications

28 weeks gestation of pregnancy
Previous cesarean delivery, antepartum x 2
Poor obstetric history: Previous IUFD (32
wks)
Smoking complicating pregnancy, second
trimester
Diabetes - Pregestational, 2nd trimester A1c
5.5 on [DATE]
OB History

Gravidity:    8         Term:   2        Prem:   1        SAB:   2
TOP:          2       Ectopic:  0        Living: 2
Fetal Evaluation

Num Of Fetuses:     1
Fetal Heart         142
Rate(bpm):
Cardiac Activity:   Observed
Presentation:       Breech
Placenta:           Anterior, above cervical os

Amniotic Fluid
AFI FV:      Polyhydramnios

AFI Sum(cm)     %Tile       Largest Pocket(cm)
28.66           > 97

RUQ(cm)       RLQ(cm)       LUQ(cm)        LLQ(cm)
8.89
Biophysical Evaluation

Amniotic F.V:   Within normal limits       F. Tone:        Observed
F. Movement:    Observed                   Score:          [DATE]
F. Breathing:   Observed
Gestational Age

LMP:           28w 6d       Date:   08/14/16                 EDD:   05/21/17
Best:          28w 6d    Det. By:   LMP  (08/14/16)          EDD:   05/21/17
Anatomy

Cranium:               Previously seen        Aortic Arch:            Previously seen
Cavum:                 Previously seen        Ductal Arch:            Previously seen
Ventricles:            Previously seen        Diaphragm:              Previously seen
Choroid Plexus:        Previously seen        Stomach:                Previously Seen
Cerebellum:            Previously seen        Abdomen:                Previously seen
Posterior Fossa:       Previously seen        Abdominal Wall:         Previously seen
Nuchal Fold:           Previously seen        Cord Vessels:           Previously seen
Face:                  Profile previously     Kidneys:                Appear normal
seen
Lips:                  Appears normal         Bladder:                Appears normal
Thoracic:              Previously seen        Spine:                  Previously seen
Heart:                 Previously seen        Upper Extremities:      Previously seen
RVOT:                  Previously seen        Lower Extremities:      Previously seen
LVOT:                  Appears normal

Other:  Fetus appears to be a male. Nasal bone prev visualized. Technically
difficult due to maternal habitus and fetal position.
Impression

Single IUP at 28w 6d
Type 2 diabetes, hx of previous 32 week fetal demise
Breech presentation
BPP [DATE]
Mild polyhydramnios is noted with an AFI of 28.7 cm
Recommendations

Recommend weekly BPPs; may transition to 2x weekly NSTs
with weekly AFIs at 32 weeks
Ultrasound for growth next week

## 2018-12-08 IMAGING — US US MFM OB FOLLOW-UP
1 series · 13 of 28 positions shown · non-contrast
Comparison: none

[Series 1: us mfm ob follow-up · 43 acquisitions, 13 frames shown]
[im 2/43]
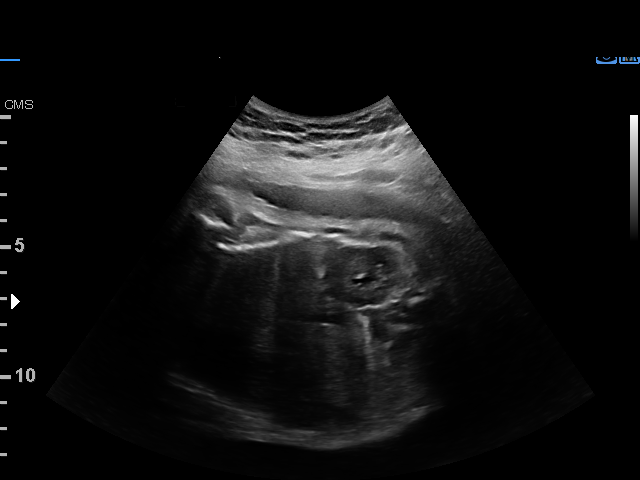
[im 5/43]
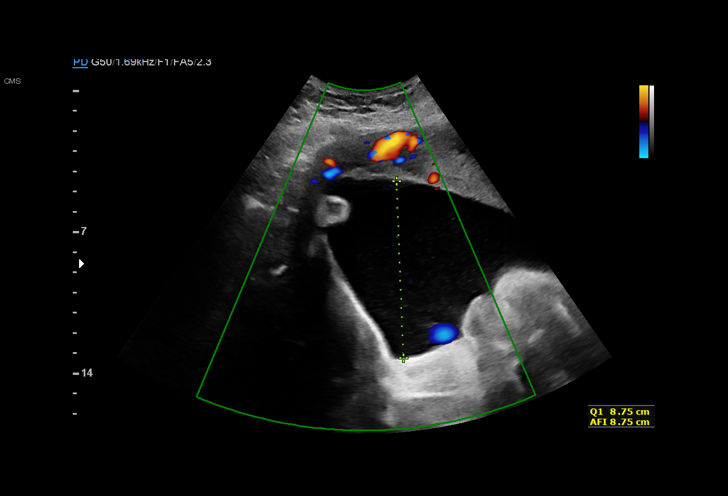
[im 8/43]
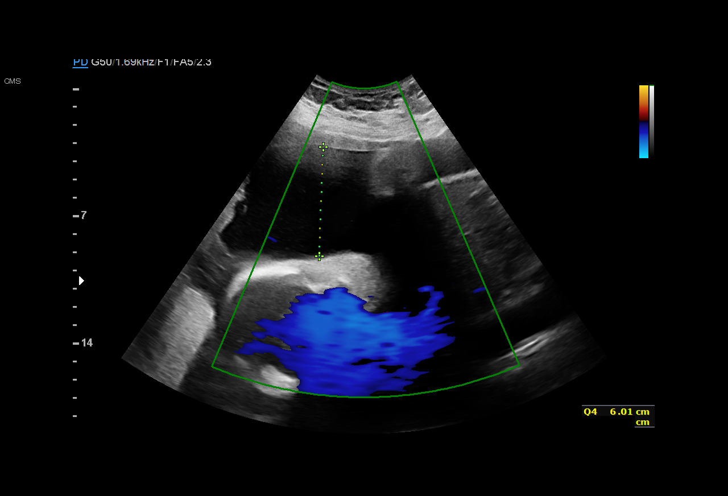
[im 11/43]
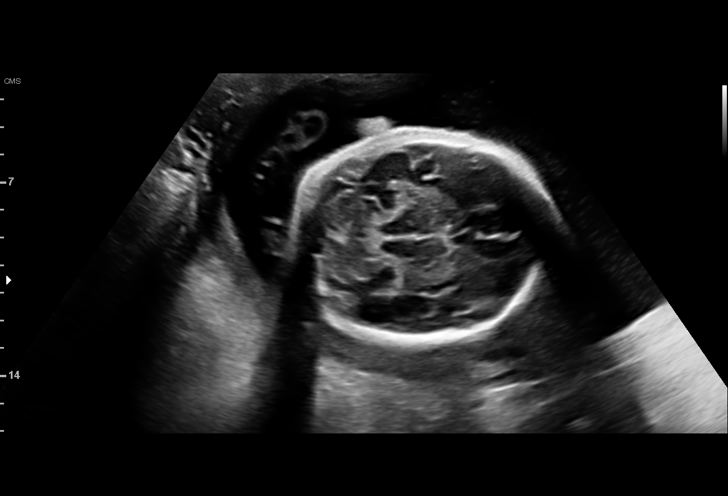
[im 15/43]
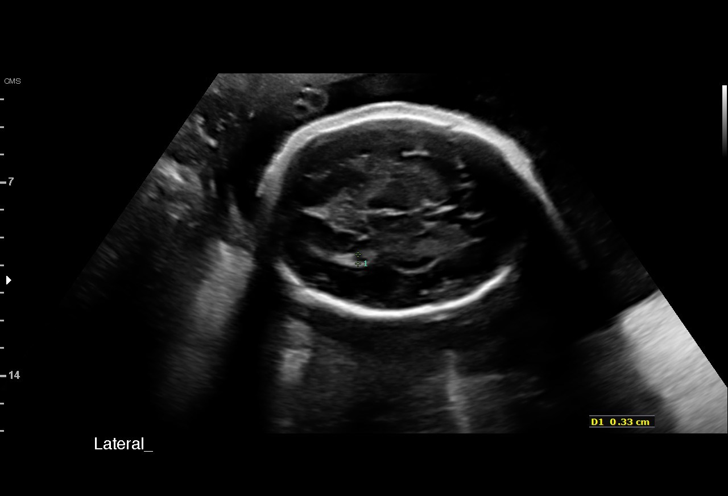
[im 18/43]
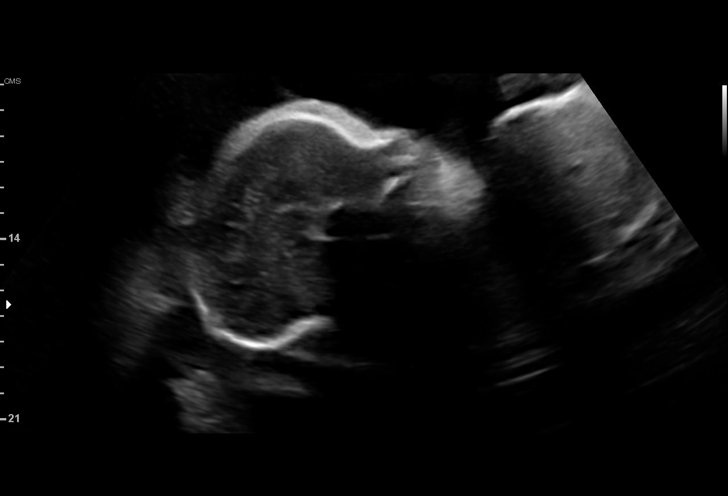
[im 22/43]
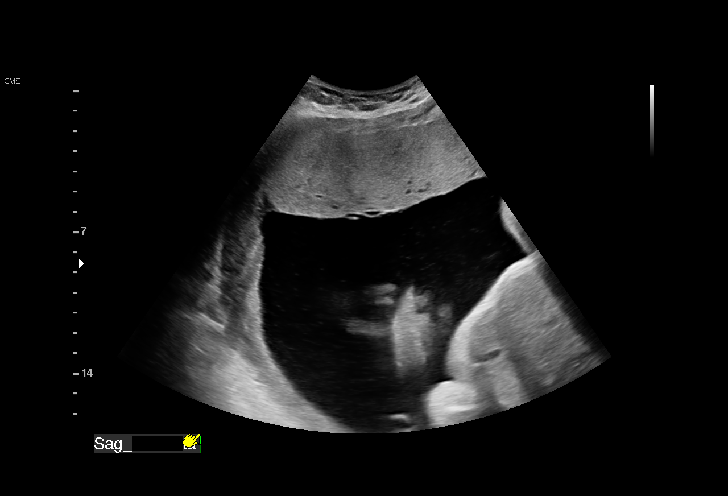
[im 25/43]
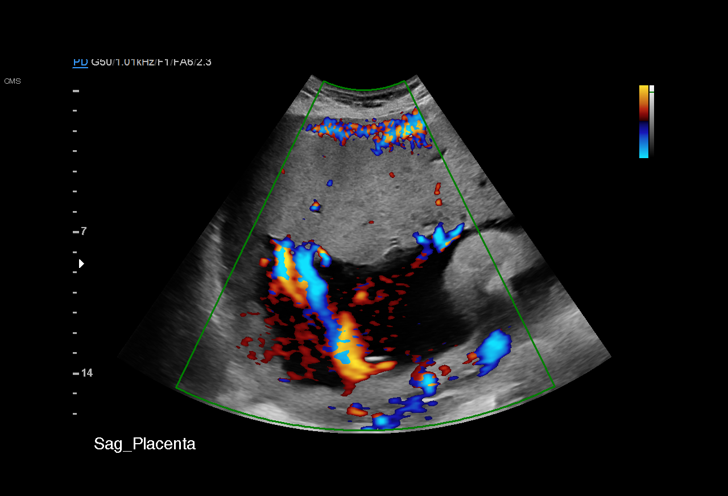
[im 29/43]
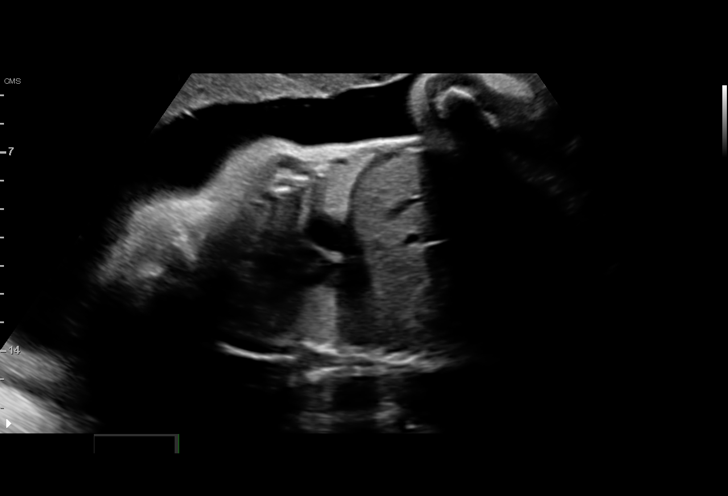
[im 32/43]
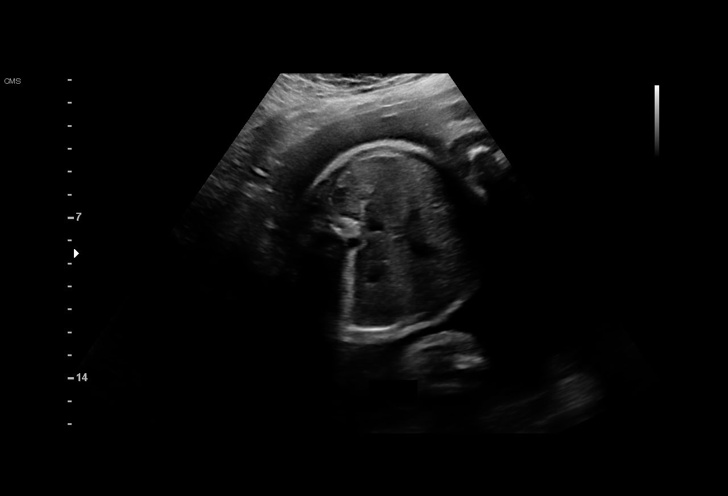
[im 35/43]
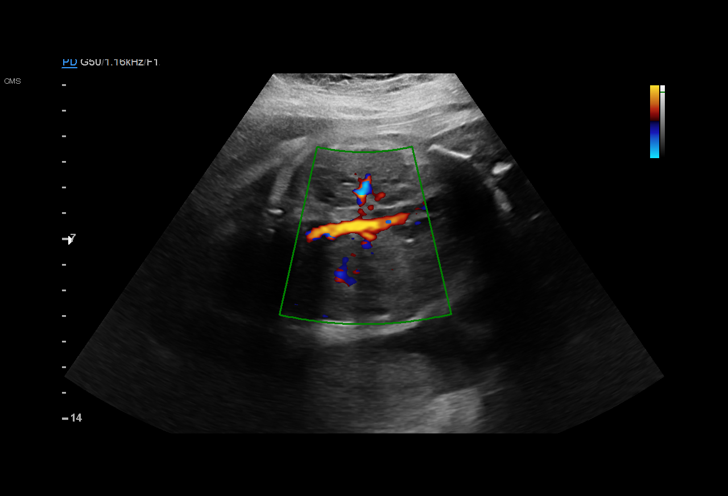
[im 38/43]
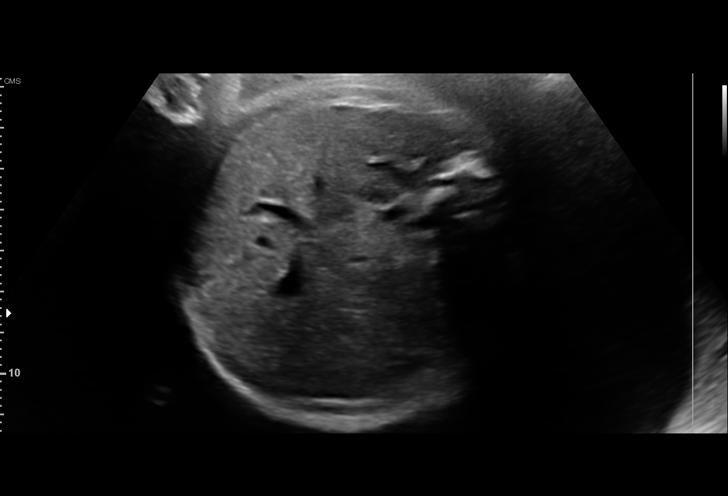
[im 41/43]
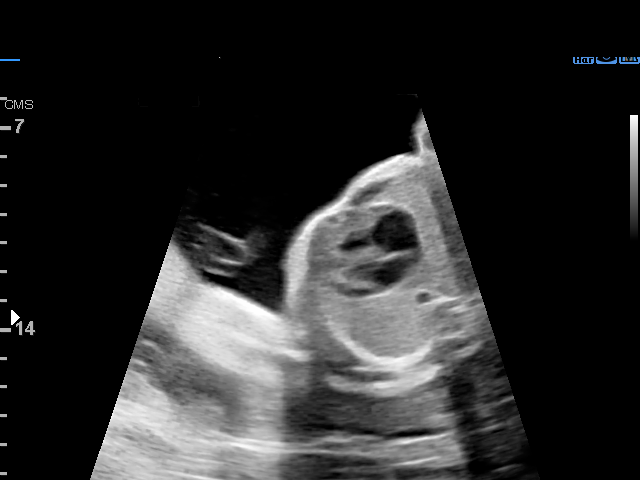

[13 of 28 positions shown; findings below may reference images not displayed]

1  ALLAHYAR BINOD           [id number]      [id number]     [id number]
2  ALLAHYAR BINOD           [id number]      [id number]     [id number]
Indications

30 weeks gestation of pregnancy
Previous cesarean delivery, antepartum x 2
Poor obstetric history: Previous IUFD (32
wks)
Smoking complicating pregnancy, third
trimester
Diabetes - Pregestational,3rd trimester (oral
meds)
Polyhydramnios, third trimester, antepartum
condition or complication, unspecified fetus
Poor obstetric history: Previous IUFD
(stillbirth - term)
OB History

Gravidity:    8         Term:   2        Prem:   1        SAB:   2
TOP:          2       Ectopic:  0        Living: 2
Fetal Evaluation

Num Of Fetuses:     1
Fetal Heart         152
Rate(bpm):
Cardiac Activity:   Observed
Placenta:           Anterior, above cervical os
P. Cord Insertion:  Marginal insertion

Amniotic Fluid
AFI FV:      Polyhydramnios
AFI Sum(cm)     %Tile       Largest Pocket(cm)
32.2            > 97

RUQ(cm)       RLQ(cm)       LUQ(cm)        LLQ(cm)
8.75
Biophysical Evaluation

Amniotic F.V:   Polyhydramnios             F. Tone:        Observed
F. Movement:    Observed                   Score:          [DATE]
F. Breathing:   Observed
Biometry

BPD:      74.8  mm     G. Age:  30w 0d         38  %    CI:        70.25   %   70 - 86
FL/HC:      18.1   %   19.2 -
HC:      284.6  mm     G. Age:  31w 2d         51  %    HC/AC:      0.99       0.99 -
AC:      288.6  mm     G. Age:  32w 6d       > 97  %    FL/BPD:     69.0   %   71 - 87
FL:       51.6  mm     G. Age:  27w 4d        < 3  %    FL/AC:      17.9   %   20 - 24
HUM:        50  mm     G. Age:  29w 2d         39  %

Est. FW:    6335  gm    3 lb 11 oz      70  %
Gestational Age

LMP:           30w 0d       Date:   08/14/16                 EDD:   05/21/17
U/S Today:     30w 3d                                        EDD:   05/18/17
Best:          30w 0d    Det. By:   LMP  (08/14/16)          EDD:   05/21/17
Anatomy

Cranium:               Appears normal         Aortic Arch:            Previously seen
Cavum:                 Appears normal         Ductal Arch:            Previously seen
Ventricles:            Appears normal         Diaphragm:              Previously seen
Choroid Plexus:        Appears normal         Stomach:                Previously Seen
Cerebellum:            Appears normal         Abdomen:                Previously seen
Posterior Fossa:       Appears normal         Abdominal Wall:         Previously seen
Nuchal Fold:           Previously seen        Cord Vessels:           Previously seen
Face:                  Appears normal         Kidneys:                Appear normal
(orbits and profile)
Lips:                  Appears normal         Bladder:                Appears normal
Thoracic:              Previously seen        Spine:                  Previously seen
Appears normal
Heart:                 Previously seen        Upper Extremities:      Previously seen
RVOT:                  Previously seen        Lower Extremities:      Previously seen
LVOT:                  Appears normal

Other:  Fetus appears to be a male. Heels visualized. Nasal bone visualized.
Technically difficult due to maternal habitus and fetal position.
Cervix Uterus Adnexa

Cervix
Not visualized (advanced GA >83wks)

Uterus
No abnormality visualized.

Left Ovary
Not visualized.
Right Ovary
Not visualized.

Cul De Sac:   No free fluid seen.

Adnexa:       No abnormality visualized.
Impression

Singleton intrauterine pregnancy at 30+0 weeks with type 2
DM and a history of IUFD. Polyhydramnios noted on last scan
Interval review of the anatomy shows no sonographic
markers for aneuploidy or structural anomalies
Amniotic fluid volume is elevated with an AFI of 32 cm
Estimated fetal weight is 1660g which is growth in the 70th
percentile. the abdominal circumfrence is >97th percentile
BPP [DATE]
Recommendations

Discussed the implications of polyhydramnios and the
enlarged AC; these would indicate poor glycemic control. She
is seeing the endocrinologist next week and I encouraged her
to begin the prescribed bedtime dose of Levemir
She will begin weekly BPP here  and will have repeat growth
on 04/09/2017

## 2018-12-22 IMAGING — US US MFM FETAL BPP W/O NON-STRESS
1 series · 15 of 27 positions shown · non-contrast
Comparison: none

[Series 1: us mfm fetal bpp w/o non-stress · 27 acquisitions, 15 frames shown]
[im 1/27]
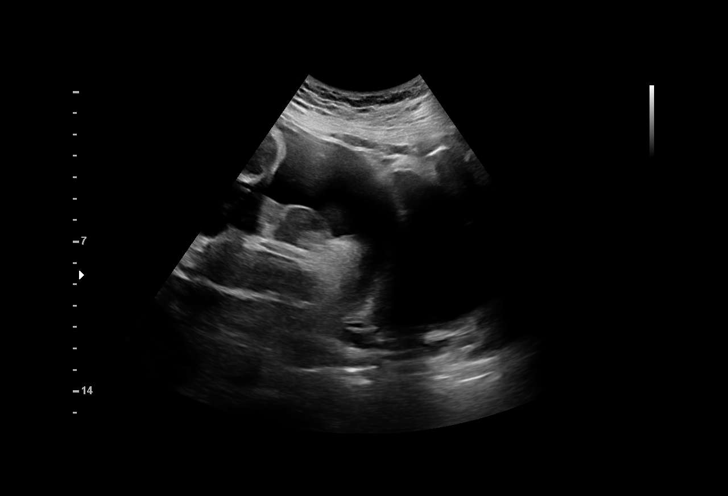
[im 3/27]
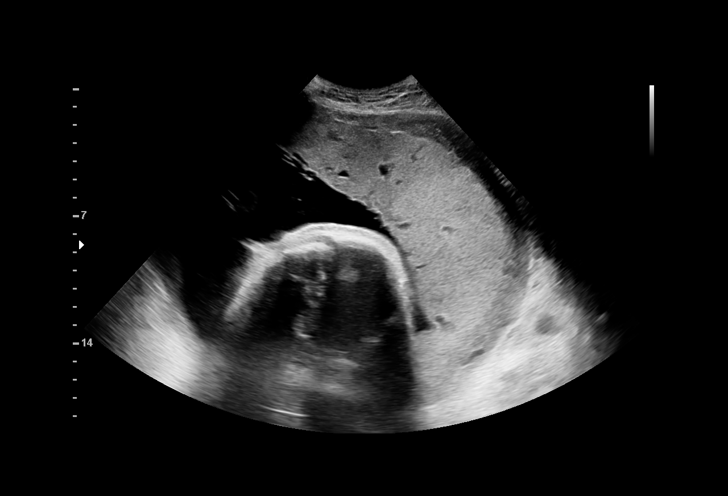
[im 5/27]
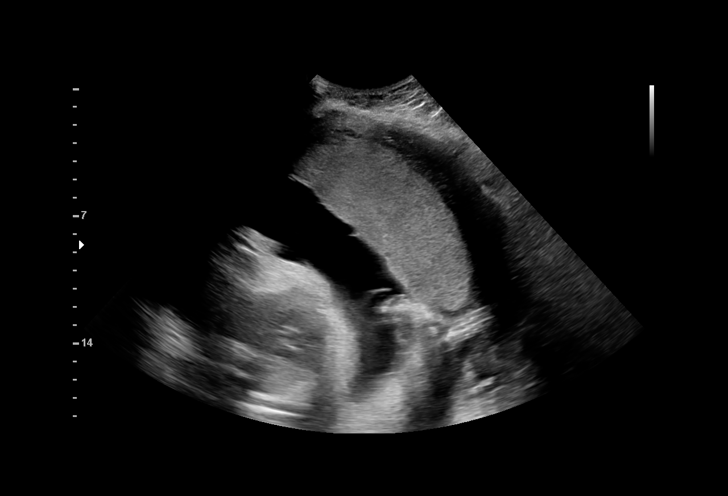
[im 7/27]
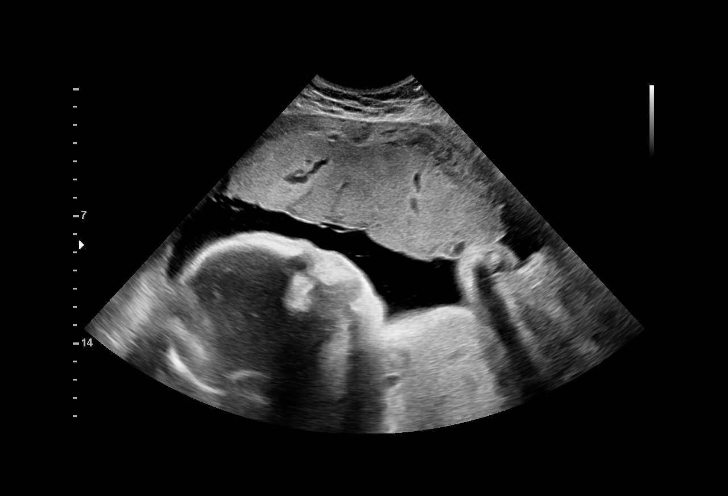
[im 9/27]
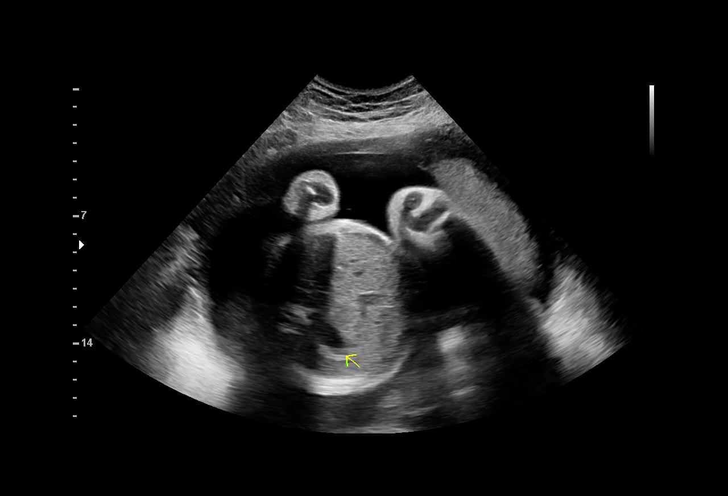
[im 10/27]
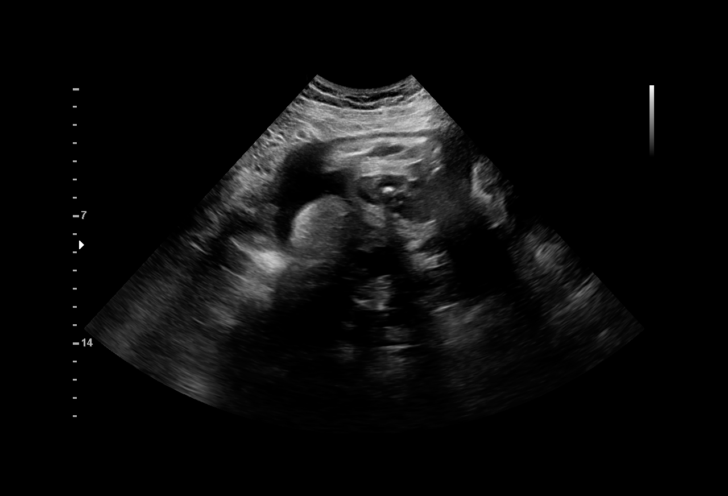
[im 12/27]
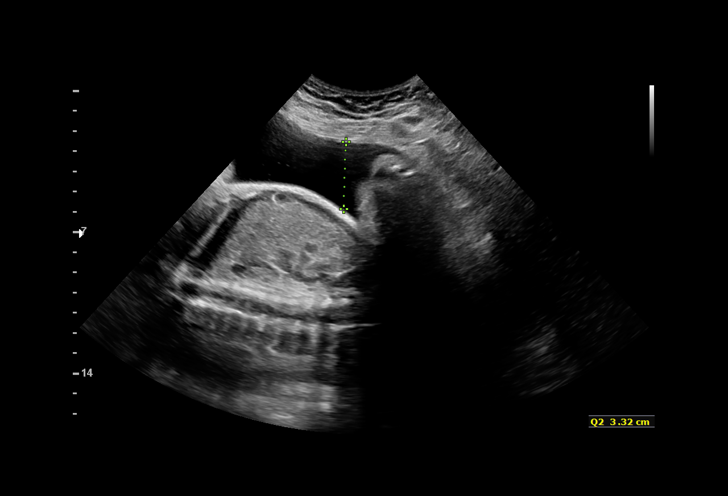
[im 14/27]
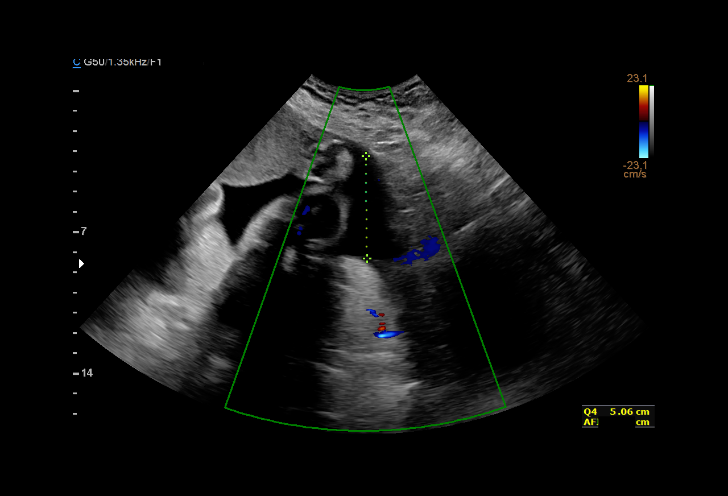
[im 16/27]
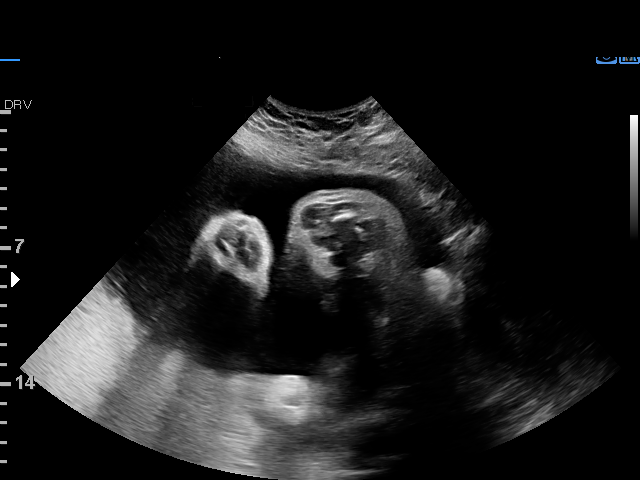
[im 18/27]
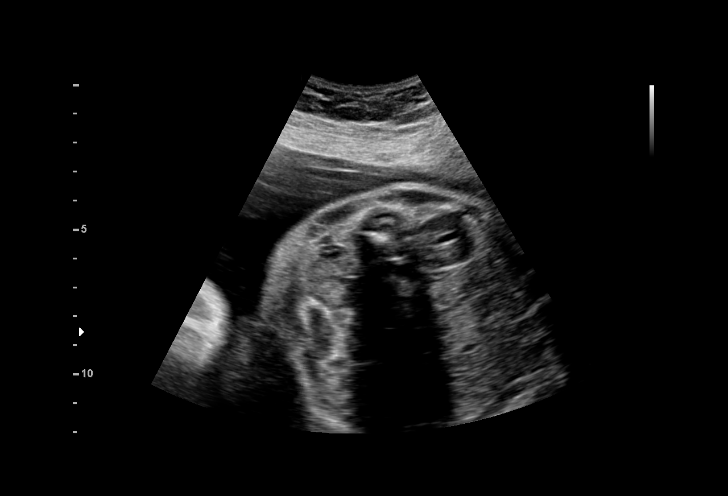
[im 19/27]
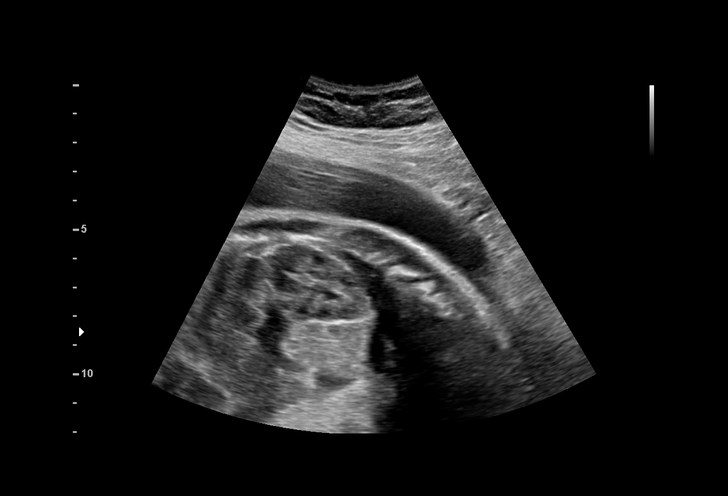
[im 21/27]
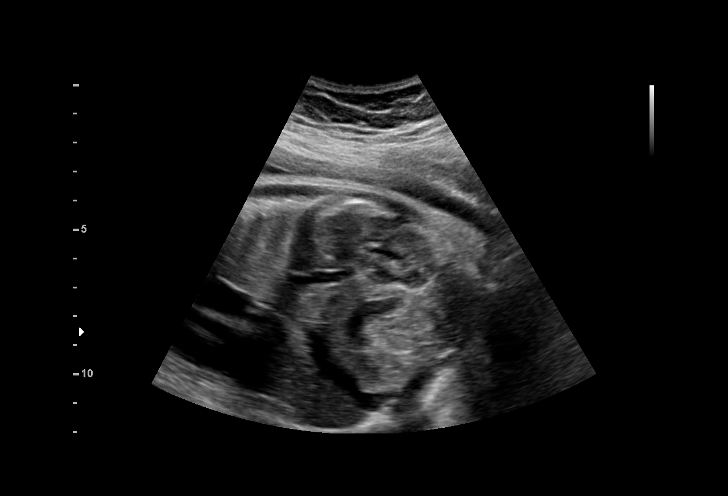
[im 23/27]
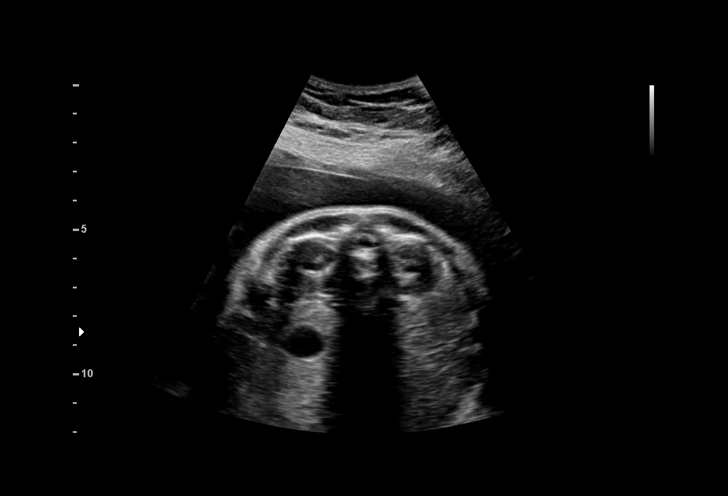
[im 25/27]
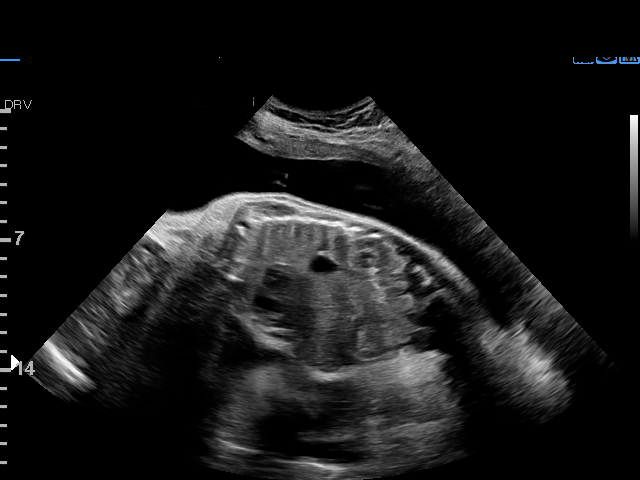
[im 27/27]
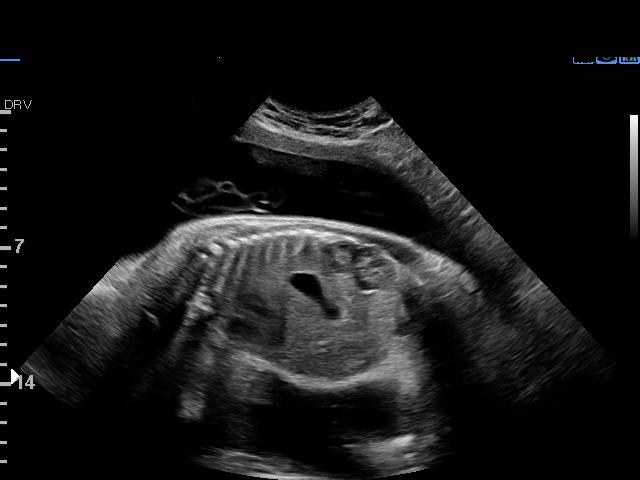

[15 of 27 positions shown; findings below may reference images not displayed]

1  IRKKA DEPNER            [id number]      [id number]     [id number]
Indications

32 weeks gestation of pregnancy
Previous cesarean delivery, antepartum x 2
Poor obstetric history: Previous IUFD (32
wks)
Smoking complicating pregnancy, third
trimester
Diabetes - Pregestational,3rd trimester (oral
meds)
Polyhydramnios, third trimester, antepartum
condition or complication, unspecified fetus
Poor obstetric history: Previous IUFD
(stillbirth - term)
OB History

Gravidity:    8         Term:   2        Prem:   1        SAB:   2
TOP:          2       Ectopic:  0        Living: 2
Fetal Evaluation

Num Of Fetuses:     1
Fetal Heart         145
Rate(bpm):
Cardiac Activity:   Observed
Presentation:       Breech
Placenta:           Anterior, above cervical os

Amniotic Fluid
AFI FV:      Polyhydramnios

AFI Sum(cm)     %Tile       Largest Pocket(cm)
26.06           96
RUQ(cm)       RLQ(cm)       LUQ(cm)        LLQ(cm)
8.97
Biophysical Evaluation

Amniotic F.V:   Within normal limits       F. Tone:        Observed
F. Movement:    Observed                   N.S.T:          Reactive
F. Breathing:   Not Observed               Score:          [DATE]
Gestational Age

LMP:           32w 0d       Date:   08/14/16                 EDD:   05/21/17
Best:          32w 0d    Det. By:   LMP  (08/14/16)          EDD:   05/21/17
Anatomy

Stomach:               Appears normal, left   Bladder:                Appears normal
sided
Kidneys:               Appear normal
Impression

Singleton intrauterine pregnancy at 32 weeks 0 days
gestation with fetal cardiac activity
Breech presentation
BPP [DATE] (-2 for fetal breathing) and AFI > 26 cm
Recommendations

Continue weekly BPPs

## 2019-03-09 ENCOUNTER — Encounter: Payer: Self-pay | Admitting: *Deleted

## 2019-08-06 ENCOUNTER — Emergency Department (HOSPITAL_COMMUNITY): Payer: Medicare Other

## 2019-08-06 ENCOUNTER — Encounter (HOSPITAL_COMMUNITY): Payer: Self-pay

## 2019-08-06 ENCOUNTER — Emergency Department (HOSPITAL_COMMUNITY)
Admission: EM | Admit: 2019-08-06 | Discharge: 2019-08-06 | Disposition: A | Discharge status: Home / Self Care | Payer: Medicare Other | Attending: Emergency Medicine | Admitting: Emergency Medicine

## 2019-08-06 DIAGNOSIS — M25561 Pain in right knee: Secondary | ICD-10-CM | POA: Insufficient documentation

## 2019-08-06 DIAGNOSIS — R1011 Right upper quadrant pain: Secondary | ICD-10-CM | POA: Diagnosis present

## 2019-08-06 DIAGNOSIS — Z79899 Other long term (current) drug therapy: Secondary | ICD-10-CM | POA: Diagnosis not present

## 2019-08-06 DIAGNOSIS — F17210 Nicotine dependence, cigarettes, uncomplicated: Secondary | ICD-10-CM | POA: Insufficient documentation

## 2019-08-06 DIAGNOSIS — Z7982 Long term (current) use of aspirin: Secondary | ICD-10-CM | POA: Diagnosis not present

## 2019-08-06 DIAGNOSIS — E119 Type 2 diabetes mellitus without complications: Secondary | ICD-10-CM | POA: Insufficient documentation

## 2019-08-06 DIAGNOSIS — M546 Pain in thoracic spine: Secondary | ICD-10-CM | POA: Diagnosis not present

## 2019-08-06 LAB — CBC WITH DIFFERENTIAL/PLATELET
Abs Immature Granulocytes: 0.02 10*3/uL (ref 0.00–0.07)
Basophils Absolute: 0 10*3/uL (ref 0.0–0.1)
Basophils Relative: 1 %
Eosinophils Absolute: 0.1 10*3/uL (ref 0.0–0.5)
Eosinophils Relative: 1 %
HCT: 38.5 % (ref 36.0–46.0)
Hemoglobin: 12.4 g/dL (ref 12.0–15.0)
Immature Granulocytes: 0 %
Lymphocytes Relative: 22 %
Lymphs Abs: 1.9 10*3/uL (ref 0.7–4.0)
MCH: 27 pg (ref 26.0–34.0)
MCHC: 32.2 g/dL (ref 30.0–36.0)
MCV: 83.7 fL (ref 80.0–100.0)
Monocytes Absolute: 0.7 10*3/uL (ref 0.1–1.0)
Monocytes Relative: 8 %
Neutro Abs: 6 10*3/uL (ref 1.7–7.7)
Neutrophils Relative %: 68 %
Platelets: 260 10*3/uL (ref 150–400)
RBC: 4.6 MIL/uL (ref 3.87–5.11)
RDW: 14.6 % (ref 11.5–15.5)
WBC: 8.8 10*3/uL (ref 4.0–10.5)
nRBC: 0 % (ref 0.0–0.2)

## 2019-08-06 LAB — COMPREHENSIVE METABOLIC PANEL
ALT: 14 U/L (ref 0–44)
AST: 16 U/L (ref 15–41)
Albumin: 3.6 g/dL (ref 3.5–5.0)
Alkaline Phosphatase: 48 U/L (ref 38–126)
Anion gap: 7 (ref 5–15)
BUN: 7 mg/dL (ref 6–20)
CO2: 23 mmol/L (ref 22–32)
Calcium: 8.8 mg/dL — ABNORMAL LOW (ref 8.9–10.3)
Chloride: 106 mmol/L (ref 98–111)
Creatinine, Ser: 0.81 mg/dL (ref 0.44–1.00)
GFR calc Af Amer: >60 mL/min (ref >60)
GFR calc non Af Amer: >60 mL/min (ref >60)
Glucose, Bld: 167 mg/dL — ABNORMAL HIGH (ref 70–99)
Potassium: 4.2 mmol/L (ref 3.5–5.1)
Sodium: 136 mmol/L (ref 135–145)
Total Bilirubin: 0.5 mg/dL (ref 0.3–1.2)
Total Protein: 6.9 g/dL (ref 6.5–8.1)

## 2019-08-06 LAB — URINALYSIS, ROUTINE W REFLEX MICROSCOPIC
Bilirubin Urine: NEGATIVE
Glucose, UA: 50 mg/dL — AB
Hgb urine dipstick: NEGATIVE
Ketones, ur: NEGATIVE mg/dL
Leukocytes,Ua: NEGATIVE
Nitrite: NEGATIVE
Protein, ur: NEGATIVE mg/dL
Specific Gravity, Urine: 1.025 (ref 1.005–1.030)
pH: 7 (ref 5.0–8.0)

## 2019-08-06 LAB — LIPASE, BLOOD: Lipase: 22 U/L (ref 11–51)

## 2019-08-06 LAB — D-DIMER, QUANTITATIVE (NOT AT ARMC): D-Dimer, Quant: 0.98 ug/mL-FEU — ABNORMAL HIGH (ref 0.00–0.50)

## 2019-08-06 LAB — I-STAT BETA HCG BLOOD, ED (MC, WL, AP ONLY): I-stat hCG, quantitative: <5 m[IU]/mL (ref <5)

## 2019-08-06 MED ORDER — HYDROCODONE-ACETAMINOPHEN 5-325 MG PO TABS
1.0000 | ORAL_TABLET | Freq: Once | ORAL | Status: AC
  Administered 2019-08-06: 1 via ORAL
  Filled 2019-08-06: qty 1

## 2019-08-06 MED ORDER — LIDOCAINE 5 % EX PTCH: 1.0000 | MEDICATED_PATCH | CUTANEOUS | 0 refills | Status: DC

## 2019-08-06 MED ORDER — NICOTINE 14 MG/24HR TD PT24: 14.0000 mg | MEDICATED_PATCH | Freq: Once | TRANSDERMAL | Status: DC

## 2019-08-06 MED ORDER — IOHEXOL 350 MG/ML SOLN
80.0000 mL | Freq: Once | INTRAVENOUS | Status: AC | PRN
  Administered 2019-08-06: 80 mL via INTRAVENOUS

## 2019-08-06 NOTE — Discharge Instructions (Signed)
Follow-up with primary care doctor if your symptoms have not improved over the next 2 days.  Place heat to your back.  I also prescribed you lidocaine patches.  You placed 1 to the area leave on for 12 hours and then remove for 12 hours before he completes an additional patch.  Would also recommend getting COVID testing outpatient since he did not want this performed today.    Return for any new worsening symptoms.

## 2019-08-06 NOTE — ED Triage Notes (Signed)
Patient complains of back pain and right knee pain x 3 days. Pain worse with inspiration, reports mild cough. Patient alert and oriented, NAD. Denies trauma to knee

## 2019-08-06 NOTE — ED Provider Notes (Addendum)
[facility] EMERGENCY DEPARTMENT Provider Note   CSN: [id number] Arrival date & time: 08/06/19  8657    History   Chief Complaint Chief Complaint  Patient presents with  . back pain/ knee pain   HPI Kathryn Mayo is a 33 y.o. female with past medical history significant for DM, tobacco use who presents for evaluation of back pain and knee pain. Pain began 3 days ago.  Patient states she has right upper quadrant abdominal pain which radiates into her back.  Pain worse with movement.  She took Motrin which significantly helped her symptoms however she feels like she now has a dull ache in her right upper quadrant.  Pain not worse with food intake.  States she will occasionally get a sharp pain to her right upper quadrant that goes into her right flank when she breathes quickly and moves.  She denies any chest pain, SOB, hemoptysis, unilateral leg swelling, redness, warmth, history of PE, DVT, chest pain nonexertional in nature, no associated diaphoresis, nausea, vomiting, dizziness.  No prior history of hypertension, hyperlipidemia, family history of MI at early age.  Patient states 1 week ago she did notice some aching to the anterior portion over her right patella.  Pain did not radiate.  No pain to popliteal fossa, calf, no recent surgery, immobilization, malignancy, history of DVT.  No fever, chills, nausea, vomiting, abdominal pain, new lateral weakness, paresthesias, decreased range of motion to extremities.  She has been ambulatory without difficulty.  Patient states she no longer has pain to her patella however wants to "get it checked out."  No history of trauma.  Denies additional aggravating or alleviating factors.  History obtained from patient and past medical records.  No interpreter was used.    History obtained from patient and past medical records. No interpretor was used.     HPI  Past Medical History:  Diagnosis Date  . Carpal tunnel syndrome on both  sides   . Diabetes mellitus type 2, diet-controlled (HCC)    followed by pcp  . History of abnormal cervical Pap smear   . History of cardiac murmur as a child   . History of fetal demise, not currently pregnant 2002    Patient Active Problem List   Diagnosis Date Noted  . Status post repeat low transverse cesarean section 05/03/2017  . Malpresentation before onset of labor 04/25/2017  . History of cesarean delivery, currently pregnant 04/21/2017  . DM2 (diabetes mellitus, type 2) (HCC) 04/21/2017  . Polyhydramnios 03/05/2017  . Allergy to insulin 02/02/2017  . Prior pregnancy with fetal demise 01/26/2017  . Rh negative, antepartum 12/21/2016  . Smoker 11/23/2016  . Depression affecting pregnancy 10/27/2016  . PTSD (post-traumatic stress disorder) 10/27/2016  . Obesity affecting pregnancy 10/27/2016  . Type 2 diabetes mellitus affecting pregnancy, antepartum 10/27/2016  . Poor dentition 10/27/2016  . Supervision of high-risk pregnancy 10/26/2016  . History of miscarriage 05/18/2013    Past Surgical History:  Procedure Laterality Date  . CESAREAN SECTION  2005;  05-26-2006  . CESAREAN SECTION N/A 04/30/2017   Procedure: CESAREAN SECTION;  Surgeon: Reva Bores, MD;  Location: [facility] BIRTHING SUITES;  Service: Obstetrics;  Laterality: N/A;  . ORIF ANKLE FRACTURE Left 07/26/2018   Procedure: OPEN REDUCTION INTERNAL FIXATION (ORIF) LEFT ANKLE FRACTURE;  Surgeon: Sheral Apley, MD;  Location: [facility];  Service: Orthopedics;  Laterality: Left;  . WISDOM TOOTH EXTRACTION  2009     OB History  Gravida  8   Para  4   Term  4   Preterm  0   AB  4   Living  3     SAB  2   TAB  2   Ectopic      Multiple  0   Live Births  3            Home Medications    Prior to Admission medications   Medication Sig Start Date End Date Taking? Authorizing Provider  aspirin EC 81 MG tablet Take 1 tablet (81 mg total) by mouth 2 (two) times daily. For  DVT prophylaxis 07/26/18   Albina BilletMartensen, Henry Calvin III, PA-C  b complex vitamins tablet Take 1 tablet by mouth 3 (three) times a week.    [provider]  gabapentin (NEURONTIN) 300 MG capsule Take 1 capsule (300 mg total) by mouth 2 (two) times daily for 14 days. For 2 weeks post op for pain. 07/26/18 08/09/18  Albina BilletMartensen, Henry Calvin III, PA-C  lidocaine (LIDODERM) 5 % Place 1 patch onto the skin daily. Remove & Discard patch within 12 hours or as directed by MD 08/06/19   Hazael Olveda A, PA-C  methocarbamol (ROBAXIN) 500 MG tablet Take 1 tablet (500 mg total) by mouth every 6 (six) hours as needed for muscle spasms. 07/26/18   Martensen, Lucretia KernHenry Calvin III, PA-C  ondansetron (ZOFRAN) 4 MG tablet Take 1 tablet (4 mg total) by mouth every 8 (eight) hours as needed for nausea or vomiting. 07/26/18   Albina BilletMartensen, Henry Calvin III, PA-C  Polyethylene Glycol 3350 (MIRALAX PO) Take by mouth as needed.    [provider]  Prenatal Vit-Fe Fumarate-FA (PRENATAL MULTIVITAMIN) TABS tablet Take 1 tablet by mouth daily.     [provider]    Family History Family History  Problem Relation Age of Onset  . Asthma Mother   . Drug abuse Mother        died of overdose  . HIV/AIDS Mother        [id number]  . Asthma Daughter   . Diabetes Maternal Grandmother   . Cancer Maternal Grandmother        breast    Social History Social History   Tobacco Use  . Smoking status: Current Every Day Smoker    Packs/day: 0.50    Years: 15.00    Pack years: 7.50    Types: Cigarettes  . Smokeless tobacco: Never Used  Substance Use Topics  . Alcohol use: Yes    Comment: very rare  . Drug use: Not Currently     Allergies   Shellfish allergy and Insulins   Review of Systems Review of Systems  Constitutional: Negative.   HENT: Negative.   Eyes: Negative.   Respiratory: Positive for cough (Chronic non productive). Negative for apnea, choking, chest tightness, shortness of breath, wheezing  and stridor.   Cardiovascular: Negative.   Gastrointestinal: Positive for abdominal pain. Negative for abdominal distention, anal bleeding, blood in stool, constipation, diarrhea, nausea, rectal pain and vomiting.  Genitourinary: Negative.  Vaginal pain: Right knee pain.  Musculoskeletal: Positive for back pain. Negative for arthralgias, gait problem, joint swelling, myalgias, neck pain and neck stiffness.  Skin: Negative.   Neurological: Negative.   All other systems reviewed and are negative.  Physical Exam Updated Vital Signs BP 119/68   Pulse 60   Temp 98.5 F (36.9 C) (Oral)   Resp 18   SpO2 100%   Physical Exam Vitals signs  and nursing note reviewed.  Constitutional:      General: She is not in acute distress.    Appearance: She is well-developed. She is not ill-appearing, toxic-appearing or diaphoretic.     Comments: Talking on phone on initial evaluation.  No acute distress noted.  HENT:     Head: Normocephalic and atraumatic.     Nose: Nose normal.     Mouth/Throat:     Pharynx: Oropharynx is clear.  Eyes:     Pupils: Pupils are equal, round, and reactive to light.  Neck:     Musculoskeletal: Normal range of motion.  Cardiovascular:     Rate and Rhythm: Normal rate.     Pulses: Normal pulses.          Radial pulses are 2+ on the right side and 2+ on the left side.     Heart sounds: Normal heart sounds.     Comments: No murmurs, rubs or gallops. Pulmonary:     Effort: Pulmonary effort is normal. No tachypnea or respiratory distress.     Breath sounds: Normal breath sounds and air entry. No stridor or decreased air movement.     Comments: Clear to auscultation bilateral without wheeze, rhonchi or rales.  Speaks in full sentences without difficulty.  No tachypnea. Chest:       Comments: Right inferior chest, right upper abdomen tenderness palpation able to be reproduced.  No overlying skin changes, no crepitus or step-offs. Abdominal:     General: Bowel sounds  are normal. There is no distension.     Palpations: Abdomen is soft.     Tenderness: There is abdominal tenderness in the right upper quadrant. There is no right CVA tenderness, left CVA tenderness, guarding or rebound.       Comments: Tenderness palpation to right upper quadrant with negative Murphy sign.  No rebound or guarding.  No evidence of abdominal wall herniations.  Musculoskeletal: Normal range of motion.     Right knee: Normal.     Left knee: Normal.       Back:     Comments: Tenderness palpation to right upper flank able to be reproduced to palpation.  Pain radiates into right side and right upper abdomen.  Full range of motion bilateral lower extremities.  Negative varus, valgus stress.  Negative anterior drawer bilateral knees.  No tenderness to popliteal fossa.  Homans sign negative.  Calves nontender without edema, erythema, ecchymosis or warmth.  Skin:    General: Skin is warm and dry.     Comments: Brisk capillary refill.  No rashes or lesions.  No edema, erythema, ecchymosis or warmth.  Neurological:     Mental Status: She is alert.     Comments: Cranial nerves II through XII grossly intact.  Ambulatory without difficulty.  No facial droop.      ED Treatments / Results  Labs (all labs ordered are listed, but only abnormal results are displayed) Labs Reviewed  COMPREHENSIVE METABOLIC PANEL - Abnormal; Notable for the following components:      Result Value   Glucose, Bld 167 (*)    Calcium 8.8 (*)    All other components within normal limits  URINALYSIS, ROUTINE W REFLEX MICROSCOPIC - Abnormal; Notable for the following components:   APPearance CLOUDY (*)    Glucose, UA 50 (*)    All other components within normal limits  D-DIMER, QUANTITATIVE (NOT AT [facility]) - Abnormal; Notable for the following components:   D-Dimer, Quant 0.98 (*)  All other components within normal limits  CBC WITH DIFFERENTIAL/PLATELET  LIPASE, BLOOD  I-STAT BETA HCG BLOOD, ED ([facility], [facility],  [facility] ONLY)    EKG EKG Interpretation  Date/Time:  Sunday August 06 2019 08:40:33 EDT Ventricular Rate:  63 PR Interval:  182 QRS Duration: 82 QT Interval:  382 QTC Calculation: 390 R Axis:   74 Text Interpretation:  Normal sinus rhythm with sinus arrhythmia Normal ECG Confirmed by Raeford Razor [phone]) on 08/06/2019 9:18:57 AM   Radiology Dg Chest 2 View  Result Date: 08/06/2019 CLINICAL DATA:  Cough, back pain. EXAM: CHEST - 2 VIEW COMPARISON:  None. FINDINGS: The heart size and mediastinal contours are within normal limits. Both lungs are clear. No pneumothorax or pleural effusion is noted. The visualized skeletal structures are unremarkable. IMPRESSION: No active cardiopulmonary disease. Electronically Signed   By: Lupita Raider M.D.   On: 08/06/2019 08:45   Ct Angio Chest Pe W/cm &/or Wo Cm  Result Date: 08/06/2019 CLINICAL DATA:  Shortness of breath, chest pain. EXAM: CT ANGIOGRAPHY CHEST WITH CONTRAST TECHNIQUE: Multidetector CT imaging of the chest was performed using the standard protocol during bolus administration of intravenous contrast. Multiplanar CT image reconstructions and MIPs were obtained to evaluate the vascular anatomy. CONTRAST:  41mL OMNIPAQUE IOHEXOL 350 MG/ML SOLN COMPARISON:  CT scan of August 11, 2010. FINDINGS: Cardiovascular: Satisfactory opacification of the pulmonary arteries to the segmental level. No evidence of pulmonary embolism. Normal heart size. No pericardial effusion. Mediastinum/Nodes: No enlarged mediastinal, hilar, or axillary lymph nodes. Thyroid gland, trachea, and esophagus demonstrate no significant findings. Lungs/Pleura: Lungs are clear. No pleural effusion or pneumothorax. Upper Abdomen: No acute abnormality. Musculoskeletal: No chest wall abnormality. No acute or significant osseous findings. Review of the MIP images confirms the above findings. IMPRESSION: No definite evidence of pulmonary embolus. No definite abnormality seen in the  chest. Electronically Signed   By: Lupita Raider M.D.   On: 08/06/2019 13:30   Dg Knee Complete 4 Views Right  Result Date: 08/06/2019 CLINICAL DATA:  Acute right knee pain without known injury. EXAM: RIGHT KNEE - COMPLETE 4+ VIEW COMPARISON:  None. FINDINGS: No evidence of fracture, dislocation, or joint effusion. No evidence of arthropathy or other focal bone abnormality. Soft tissues are unremarkable. IMPRESSION: Negative. Electronically Signed   By: Lupita Raider M.D.   On: 08/06/2019 08:46   US Abdomen Limited Ruq  Result Date: 08/06/2019 CLINICAL DATA:  Acute right upper quadrant abdominal pain. EXAM: ULTRASOUND ABDOMEN LIMITED RIGHT UPPER QUADRANT COMPARISON:  None. FINDINGS: Gallbladder: No gallstones or wall thickening visualized. No sonographic Murphy sign noted by sonographer. Common bile duct: Diameter: 4 mm which is within normal limits. Liver: No focal lesion identified. Within normal limits in parenchymal echogenicity. Portal vein is patent on color Doppler imaging with normal direction of blood flow towards the liver. Other: None. IMPRESSION: No abnormality seen in the right upper quadrant of the abdomen. Electronically Signed   By: Lupita Raider M.D.   On: 08/06/2019 10:05    Procedures Procedures (including critical care time)  Medications Ordered in ED Medications  nicotine (NICODERM CQ - dosed in mg/24 hours) patch 14 mg (14 mg Transdermal Refused 08/06/19 1300)  HYDROcodone-acetaminophen (NORCO/VICODIN) 5-325 MG per tablet 1 tablet (1 tablet Oral Given 08/06/19 0854)  iohexol (OMNIPAQUE) 350 MG/ML injection 80 mL (80 mLs Intravenous Contrast Given 08/06/19 1213)     Initial Impression / Assessment and Plan / ED Course  I have reviewed the  triage vital signs and the nursing notes.  Pertinent labs & imaging results that were available during my care of the patient were reviewed by me and considered in my medical decision making (see chart for details).  33 year old  appear otherwise well presents for evaluation of back pain and knee pain. Afebrile, non septic, non ill appearing.  Pain reproducible to palpation to right upper abdomen as well as right posterior flank.  Symptoms not consistent with nephrolithiasis.  Given reproducible palpation to right upper quadrant however negative Eulah Pont sign will obtain ultrasound to rule out cholelithiasis/cholecystitis.  She has no chest pain, hemoptysis, no evidence of DVT on exam.  She is PERC negative, Wells criteria low risk.  She is without tachycardia, tachypnea or hypoxia.  She did have tenderness to her right anterior patella 1 week ago however none currently.  She has normal musculoskeletal exam.  She is neurovascularly intact.  She has no evidence of DVT on exam.  I have low suspicion for ACS, PE, dissection, Borhaave as cause of her symptoms.  No prior history of hypertension, hyperlipidemia, no recent surgery, immobilization.  No red flags for back pain.  Low suspicion for cauda equina, discitis, osteomyelitis, psoas abscess.  DG knee negative for fracture, dislocation, effusion, soft tissue swelling, osteomyelitis Plain film chest negative for cardiomegaly, pulmonary edema, pneumothorax, infiltrates CBC without leukocytosis, hemoglobin 12.4 CMP mildly elevated glucose at 167, no additional electrolyte, renal or liver abnormality Lipase 22 Pregnancy test negative Urinalysis negative for infection Ultrasound negative right upper quadrant ultrasound.  No evidence of cholecystitis or cholelithiasis EKG no ST/T changes.  No STEMI. DDimer elevated at 0.98, will order CTA chest to r/o PE  1045: Patient does not want  anything for pain at this time.  Discussed elevated d-dimer and CTA.  She is amenable to plan.  She is requesting nicotine patch.  Will order.  1330: Patient does not eat anything additional for pain at this time.  Her CTA is negative for clot.  Highly suspect MSK pain. Pain is not likely of cardiac or  pulmonary etiology d/t presentation, PERC negative, VSS, no tracheal deviation, no JVD or new murmur, RRR, breath sounds equal bilaterally, EKG without acute abnormalities,  and negative CXR. Pt has been advised to return to the ED if CP becomes exertional, associated with diaphoresis or nausea, radiates to left jaw/arm, worsens or becomes concerning in any way. Pt appears reliable for follow up and is agreeable to discharge.  Offered symptomatic management at home and COVID testing however patient declined at states she will follow up outpatient with her PCP.  Patient is nontoxic, nonseptic appearing, in no apparent distress.  Patient's pain and other symptoms adequately managed in emergency department.  Fluid bolus given.  Labs, imaging and vitals reviewed.  Patient does not meet the SIRS or Sepsis criteria.  On repeat exam patient does not have a surgical abdomin and there are no peritoneal signs.  No indication of appendicitis, bowel obstruction, bowel perforation, cholecystitis, diverticulitis, PID or ectopic pregnancy.  Patient discharged home with symptomatic treatment and given strict instructions for follow-up with their primary care physician.  I have also discussed reasons to return immediately to the ER.  Patient expresses understanding and agrees with plan.  The patient has been appropriately medically screened and/or stabilized in the ED. I have low suspicion for any other emergent medical condition which would require further screening, evaluation or treatment in the ED or require inpatient management.  Nursing has notified me  that patient ripped out her IV and went to leave. Nursing was able to give her her dc papers prior to leaving.     Kathryn Mayo was evaluated in Emergency Department on 08/06/2019 for the symptoms described in the history of present illness. She was evaluated in the context of the global COVID-19 pandemic, which necessitated consideration that the patient might be at  risk for infection with the SARS-CoV-2 virus that causes COVID-19. Institutional protocols and algorithms that pertain to the evaluation of patients at risk for COVID-19 are in a state of rapid change based on information released by regulatory bodies including the CDC and federal and state organizations. These policies and algorithms were followed during the patient's care in the ED.    Final Clinical Impressions(s) / ED Diagnoses   Final diagnoses:  RUQ pain  Acute right-sided thoracic back pain    ED Discharge Orders         Ordered    lidocaine (LIDODERM) 5 %  Every 24 hours     08/06/19 1336           Trachelle Low A, PA-C 08/06/19 1338    Karinne Schmader A, PA-C 08/06/19 1348    Charlesetta Shanks, MD 08/06/19 1441

## 2019-08-06 NOTE — ED Notes (Signed)
Patient transported to CT 

## 2019-08-06 NOTE — ED Notes (Signed)
PA went in to speak to pt before discharge. Pt immediately came out of room and said that the PA said she could go. I asked the pt to return to the room to be discharged. I picked up the discharge papers and went into the room. The pt was removing her IV and then walked out. Obviously no discharge vitals were obtained. Her husband stayed back to accept the discharge papers. The PA or myself have no explanation for her behavior.

## 2020-01-28 ENCOUNTER — Emergency Department (HOSPITAL_COMMUNITY): Payer: Medicare Other

## 2020-01-28 ENCOUNTER — Encounter (HOSPITAL_COMMUNITY): Payer: Self-pay | Admitting: Emergency Medicine

## 2020-01-28 ENCOUNTER — Emergency Department (HOSPITAL_COMMUNITY)
Admission: EM | Admit: 2020-01-28 | Discharge: 2020-01-28 | Disposition: A | Discharge status: Home / Self Care | Payer: Medicare Other | Attending: Emergency Medicine | Admitting: Emergency Medicine

## 2020-01-28 ENCOUNTER — Other Ambulatory Visit: Payer: Self-pay

## 2020-01-28 DIAGNOSIS — R29810 Facial weakness: Secondary | ICD-10-CM | POA: Insufficient documentation

## 2020-01-28 DIAGNOSIS — M79604 Pain in right leg: Secondary | ICD-10-CM | POA: Diagnosis not present

## 2020-01-28 DIAGNOSIS — E119 Type 2 diabetes mellitus without complications: Secondary | ICD-10-CM | POA: Diagnosis not present

## 2020-01-28 DIAGNOSIS — R200 Anesthesia of skin: Secondary | ICD-10-CM | POA: Insufficient documentation

## 2020-01-28 DIAGNOSIS — M79602 Pain in left arm: Secondary | ICD-10-CM | POA: Diagnosis not present

## 2020-01-28 DIAGNOSIS — F17210 Nicotine dependence, cigarettes, uncomplicated: Secondary | ICD-10-CM | POA: Diagnosis not present

## 2020-01-28 LAB — COMPREHENSIVE METABOLIC PANEL
ALT: 18 U/L (ref 0–44)
AST: 22 U/L (ref 15–41)
Albumin: 3.6 g/dL (ref 3.5–5.0)
Alkaline Phosphatase: 67 U/L (ref 38–126)
Anion gap: 10 (ref 5–15)
BUN: 15 mg/dL (ref 6–20)
CO2: 23 mmol/L (ref 22–32)
Calcium: 8.8 mg/dL — ABNORMAL LOW (ref 8.9–10.3)
Chloride: 101 mmol/L (ref 98–111)
Creatinine, Ser: 0.75 mg/dL (ref 0.44–1.00)
GFR calc Af Amer: >60 mL/min (ref >60)
GFR calc non Af Amer: >60 mL/min (ref >60)
Glucose, Bld: 270 mg/dL — ABNORMAL HIGH (ref 70–99)
Potassium: 4.8 mmol/L (ref 3.5–5.1)
Sodium: 134 mmol/L — ABNORMAL LOW (ref 135–145)
Total Bilirubin: 0.7 mg/dL (ref 0.3–1.2)
Total Protein: 7.3 g/dL (ref 6.5–8.1)

## 2020-01-28 LAB — CBC WITH DIFFERENTIAL/PLATELET
Abs Immature Granulocytes: 0.05 10*3/uL (ref 0.00–0.07)
Basophils Absolute: 0.1 10*3/uL (ref 0.0–0.1)
Basophils Relative: 1 %
Eosinophils Absolute: 0.1 10*3/uL (ref 0.0–0.5)
Eosinophils Relative: 2 %
HCT: 38.9 % (ref 36.0–46.0)
Hemoglobin: 12.6 g/dL (ref 12.0–15.0)
Immature Granulocytes: 1 %
Lymphocytes Relative: 29 %
Lymphs Abs: 2.6 10*3/uL (ref 0.7–4.0)
MCH: 27.5 pg (ref 26.0–34.0)
MCHC: 32.4 g/dL (ref 30.0–36.0)
MCV: 84.7 fL (ref 80.0–100.0)
Monocytes Absolute: 0.9 10*3/uL (ref 0.1–1.0)
Monocytes Relative: 10 %
Neutro Abs: 5.3 10*3/uL (ref 1.7–7.7)
Neutrophils Relative %: 57 %
Platelets: 251 10*3/uL (ref 150–400)
RBC: 4.59 MIL/uL (ref 3.87–5.11)
RDW: 14.6 % (ref 11.5–15.5)
WBC: 9.1 10*3/uL (ref 4.0–10.5)
nRBC: 0 % (ref 0.0–0.2)

## 2020-01-28 LAB — I-STAT BETA HCG BLOOD, ED (MC, WL, AP ONLY): I-stat hCG, quantitative: <5 m[IU]/mL (ref <5)

## 2020-01-28 LAB — URINALYSIS, ROUTINE W REFLEX MICROSCOPIC
Bilirubin Urine: NEGATIVE
Glucose, UA: 150 mg/dL — AB
Hgb urine dipstick: NEGATIVE
Ketones, ur: NEGATIVE mg/dL
Leukocytes,Ua: NEGATIVE
Nitrite: NEGATIVE
Protein, ur: NEGATIVE mg/dL
Specific Gravity, Urine: 1.01 (ref 1.005–1.030)
pH: 6 (ref 5.0–8.0)

## 2020-01-28 MED ORDER — HYDROCODONE-ACETAMINOPHEN 5-325 MG PO TABS: 1.0000 | ORAL_TABLET | ORAL | 0 refills | Status: AC | PRN

## 2020-01-28 MED ORDER — OXYCODONE-ACETAMINOPHEN 5-325 MG PO TABS
1.0000 | ORAL_TABLET | Freq: Once | ORAL | Status: AC
  Administered 2020-01-28: 1 via ORAL
  Filled 2020-01-28: qty 1

## 2020-01-28 NOTE — ED Triage Notes (Addendum)
Patient c/o left leg and elbow pain started last night and got worsen this morning. Pt denies n/v. Pt a/o x4.

## 2020-01-28 NOTE — Discharge Instructions (Signed)
Take Norco for pain as directed. You can take ibuprofen as well.   Follow up with Dr. Concepcion Elk for recheck this week. Return to the emergency department with any new or concerning symptoms.

## 2020-01-28 NOTE — ED Notes (Signed)
Pt told this RN that she had started experiencing numbness in her left extremities on 01/26/20 during the evening. Pt was able to walk from the chair to the bed without assistance.

## 2020-01-28 NOTE — ED Provider Notes (Signed)
[facility] DEPT Provider Note   CSN: [id number] Arrival date & time: 01/28/20  0219     History Chief Complaint  Patient presents with  . Leg Pain    Kathryn Mayo is a 34 y.o. female.  Patient with history of diet controlled DM presents with symptoms of left arm pain with numbness in the fingers and swelling for the past 2 days. She reports left lower leg pain relating it to having stepped on a nail about 2 weeks ago, but the area started hurting tonight. No headache, visual changes, speech difficulty, imbalance, fever, cough, SOB, CP, abdominal pain or nausea. She states her husband felt she had a facial droop.   The history is provided by the patient. No language interpreter was used.  Leg Pain Associated symptoms: no fever        Past Medical History:  Diagnosis Date  . Carpal tunnel syndrome on both sides   . Diabetes mellitus type 2, diet-controlled ([place])    followed by pcp  . History of abnormal cervical Pap smear   . History of cardiac murmur as a child   . History of fetal demise, not currently pregnant 2002    Patient Active Problem List   Diagnosis Date Noted  . Status post repeat low transverse cesarean section 05/03/2017  . Malpresentation before onset of labor 04/25/2017  . History of cesarean delivery, currently pregnant 04/21/2017  . DM2 (diabetes mellitus, type 2) ([place]) 04/21/2017  . Polyhydramnios 03/05/2017  . Allergy to insulin 02/02/2017  . Prior pregnancy with fetal demise 01/26/2017  . Rh negative, antepartum 12/21/2016  . Smoker 11/23/2016  . Depression affecting pregnancy 10/27/2016  . PTSD (post-traumatic stress disorder) 10/27/2016  . Obesity affecting pregnancy 10/27/2016  . Type 2 diabetes mellitus affecting pregnancy, antepartum 10/27/2016  . Poor dentition 10/27/2016  . Supervision of high-risk pregnancy 10/26/2016  . History of miscarriage 05/18/2013    Past Surgical History:  Procedure Laterality  Date  . CESAREAN SECTION  2005;  05-26-2006  . CESAREAN SECTION N/A 04/30/2017   Procedure: CESAREAN SECTION;  Surgeon: Donnamae Jude, MD;  Location: [facility];  Service: Obstetrics;  Laterality: N/A;  . ORIF ANKLE FRACTURE Left 07/26/2018   Procedure: OPEN REDUCTION INTERNAL FIXATION (ORIF) LEFT ANKLE FRACTURE;  Surgeon: Renette Butters, MD;  Location: [facility];  Service: Orthopedics;  Laterality: Left;  . WISDOM TOOTH EXTRACTION  2009     OB History    Gravida  8   Para  4   Term  4   Preterm  0   AB  4   Living  3     SAB  2   TAB  2   Ectopic      Multiple  0   Live Births  3           Family History  Problem Relation Age of Onset  . Asthma Mother   . Drug abuse Mother        died of overdose  . HIV/AIDS Mother        17  . Asthma Daughter   . Diabetes Maternal Grandmother   . Cancer Maternal Grandmother        breast    Social History   Tobacco Use  . Smoking status: Current Every Day Smoker    Packs/day: 0.50    Years: 15.00    Pack years: 7.50    Types: Cigarettes  . Smokeless  tobacco: Never Used  Substance Use Topics  . Alcohol use: Yes    Comment: very rare  . Drug use: Not Currently    Home Medications Prior to Admission medications   Medication Sig Start Date End Date Taking? Authorizing Provider  aspirin EC 81 MG tablet Take 1 tablet (81 mg total) by mouth 2 (two) times daily. For DVT prophylaxis 07/26/18   Albina Billet III, PA-C  b complex vitamins tablet Take 1 tablet by mouth 3 (three) times a week.    [provider]  gabapentin (NEURONTIN) 300 MG capsule Take 1 capsule (300 mg total) by mouth 2 (two) times daily for 14 days. For 2 weeks post op for pain. 07/26/18 08/09/18  Albina Billet III, PA-C  lidocaine (LIDODERM) 5 % Place 1 patch onto the skin daily. Remove & Discard patch within 12 hours or as directed by MD 08/06/19   Henderly, Britni A, PA-C  methocarbamol  (ROBAXIN) 500 MG tablet Take 1 tablet (500 mg total) by mouth every 6 (six) hours as needed for muscle spasms. 07/26/18   Martensen, Lucretia Kern III, PA-C  ondansetron (ZOFRAN) 4 MG tablet Take 1 tablet (4 mg total) by mouth every 8 (eight) hours as needed for nausea or vomiting. 07/26/18   Albina Billet III, PA-C  Polyethylene Glycol 3350 (MIRALAX PO) Take by mouth as needed.    [provider]  Prenatal Vit-Fe Fumarate-FA (PRENATAL MULTIVITAMIN) TABS tablet Take 1 tablet by mouth daily.     [provider]    Allergies    Shellfish allergy and Insulins  Review of Systems   Review of Systems  Constitutional: Negative for chills and fever.  HENT: Negative.   Eyes: Negative for visual disturbance.  Respiratory: Negative.   Cardiovascular: Negative.   Gastrointestinal: Negative.   Musculoskeletal: Negative.        See HPI.  Skin: Negative.   Neurological: Positive for facial asymmetry and numbness. Negative for weakness and headaches.    Physical Exam Updated Vital Signs BP 133/85 (BP Location: Right Arm)   Pulse 88   Temp 98.3 F (36.8 C) (Oral)   Resp 16   Ht 4\' 11"  (1.499 m)   Wt 90.7 kg   LMP  (LMP Unknown)   SpO2 100%   BMI 40.40 kg/m   Physical Exam Vitals and nursing note reviewed.  Constitutional:      Appearance: She is well-developed.  HENT:     Head: Normocephalic.  Cardiovascular:     Rate and Rhythm: Normal rate and regular rhythm.  Pulmonary:     Effort: Pulmonary effort is normal.     Breath sounds: Normal breath sounds.  Abdominal:     General: Bowel sounds are normal.     Palpations: Abdomen is soft.     Tenderness: There is no abdominal tenderness. There is no guarding or rebound.  Musculoskeletal:        General: Normal range of motion.     Cervical back: Normal range of motion and neck supple.  Skin:    General: Skin is warm and dry.     Findings: No rash.  Neurological:     Mental Status: She is alert and  oriented to person, place, and time.     Comments: CN's 3-12 grossly intact. Speech is clear and focused. No facial asymmetry. No lateralizing weakness. Reflexes are equal. No deficits of coordination. Ambulatory without imbalance.       ED Results / Procedures /  Treatments   Labs (all labs ordered are listed, but only abnormal results are displayed) Labs Reviewed  CBC WITH DIFFERENTIAL/PLATELET  COMPREHENSIVE METABOLIC PANEL  URINALYSIS, ROUTINE W REFLEX MICROSCOPIC  I-STAT BETA HCG BLOOD, ED ([facility], [facility], [facility] ONLY)   Results for orders placed or performed during the hospital encounter of 01/28/20  CBC with Differential  Result Value Ref Range   WBC 9.1 4.0 - 10.5 K/uL   RBC 4.59 3.87 - 5.11 MIL/uL   Hemoglobin 12.6 12.0 - 15.0 g/dL   HCT 42.3 53.6 - 14.4 %   MCV 84.7 80.0 - 100.0 fL   MCH 27.5 26.0 - 34.0 pg   MCHC 32.4 30.0 - 36.0 g/dL   RDW 31.5 40.0 - 86.7 %   Platelets 251 150 - 400 K/uL   nRBC 0.0 0.0 - 0.2 %   Neutrophils Relative % 57 %   Neutro Abs 5.3 1.7 - 7.7 K/uL   Lymphocytes Relative 29 %   Lymphs Abs 2.6 0.7 - 4.0 K/uL   Monocytes Relative 10 %   Monocytes Absolute 0.9 0.1 - 1.0 K/uL   Eosinophils Relative 2 %   Eosinophils Absolute 0.1 0.0 - 0.5 K/uL   Basophils Relative 1 %   Basophils Absolute 0.1 0.0 - 0.1 K/uL   Immature Granulocytes 1 %   Abs Immature Granulocytes 0.05 0.00 - 0.07 K/uL  Comprehensive metabolic panel  Result Value Ref Range   Sodium 134 (L) 135 - 145 mmol/L   Potassium 4.8 3.5 - 5.1 mmol/L   Chloride 101 98 - 111 mmol/L   CO2 23 22 - 32 mmol/L   Glucose, Bld 270 (H) 70 - 99 mg/dL   BUN 15 6 - 20 mg/dL   Creatinine, Ser 6.19 0.44 - 1.00 mg/dL   Calcium 8.8 (L) 8.9 - 10.3 mg/dL   Total Protein 7.3 6.5 - 8.1 g/dL   Albumin 3.6 3.5 - 5.0 g/dL   AST 22 15 - 41 U/L   ALT 18 0 - 44 U/L   Alkaline Phosphatase 67 38 - 126 U/L   Total Bilirubin 0.7 0.3 - 1.2 mg/dL   GFR calc non Af Amer >60 >60 mL/min   GFR calc Af Amer >60 >60 mL/min    Anion gap 10 5 - 15  Urinalysis, Routine w reflex microscopic  Result Value Ref Range   Color, Urine STRAW (A) YELLOW   APPearance CLEAR CLEAR   Specific Gravity, Urine 1.010 1.005 - 1.030   pH 6.0 5.0 - 8.0   Glucose, UA 150 (A) NEGATIVE mg/dL   Hgb urine dipstick NEGATIVE NEGATIVE   Bilirubin Urine NEGATIVE NEGATIVE   Ketones, ur NEGATIVE NEGATIVE mg/dL   Protein, ur NEGATIVE NEGATIVE mg/dL   Nitrite NEGATIVE NEGATIVE   Leukocytes,Ua NEGATIVE NEGATIVE  I-Stat beta hCG blood, ED  Result Value Ref Range   I-stat hCG, quantitative <5.0 <5 mIU/mL   Comment 3            EKG None  Radiology No results found. CT Head Wo Contrast  Result Date: 01/28/2020 CLINICAL DATA:  Focal neuro deficit greater than 6 hours. Stroke suspected. Numbness in left extremities. EXAM: CT HEAD WITHOUT CONTRAST TECHNIQUE: Contiguous axial images were obtained from the base of the skull through the vertex without intravenous contrast. COMPARISON:  None. FINDINGS: Brain: No evidence of acute infarction, hemorrhage, hydrocephalus, extra-axial collection or mass lesion/mass effect. Vascular: No hyperdense vessel or unexpected calcification. Skull: Normal. Negative for fracture or focal lesion. Sinuses/Orbits: No  acute finding. Other: None. IMPRESSION: No cause for the patient's symptoms identified.  Normal study. Electronically Signed   By: Gerome Sam III M.D   On: 01/28/2020 04:06    Procedures Procedures (including critical care time)  Medications Ordered in ED Medications - No data to display  ED Course  I have reviewed the triage vital signs and the nursing notes.  Pertinent labs & imaging results that were available during my care of the patient were reviewed by me and considered in my medical decision making (see chart for details).    MDM Rules/Calculators/A&P                      34 yo otherwise healthy patient to ED with symptoms as described in the HPI.   Her exam is without focal neuro  deficits. Head CT normal. Pain treated in the ED with some relief of symptoms. Labs essentially unremarkable.   Do not feel she has had an acute neurologic event. Pain is improved. She can be discharged home and is encouraged to follow up with her doctor for further evaluation in the outpatient setting.  Final Clinical Impression(s) / ED Diagnoses Final diagnoses:  None   1. Pain of left UE  Rx / DC Orders ED Discharge Orders    None       Elpidio Anis, PA-C 02/01/20 0631    Wilkie Aye Mayer Masker, MD 02/01/20 [phone]

## 2021-05-28 ENCOUNTER — Other Ambulatory Visit: Payer: Self-pay | Admitting: Podiatry

## 2021-05-28 ENCOUNTER — Ambulatory Visit (INDEPENDENT_AMBULATORY_CARE_PROVIDER_SITE_OTHER): Payer: Medicare Other

## 2021-05-28 ENCOUNTER — Other Ambulatory Visit: Payer: Self-pay

## 2021-05-28 ENCOUNTER — Encounter: Payer: Self-pay | Admitting: Podiatry

## 2021-05-28 ENCOUNTER — Ambulatory Visit (INDEPENDENT_AMBULATORY_CARE_PROVIDER_SITE_OTHER): Payer: Medicare Other | Admitting: Podiatry

## 2021-05-28 DIAGNOSIS — M7752 Other enthesopathy of left foot: Secondary | ICD-10-CM

## 2021-05-28 DIAGNOSIS — M79672 Pain in left foot: Secondary | ICD-10-CM | POA: Diagnosis not present

## 2021-05-28 DIAGNOSIS — E1100 Type 2 diabetes mellitus with hyperosmolarity without nonketotic hyperglycemic-hyperosmolar coma (NKHHC): Secondary | ICD-10-CM | POA: Diagnosis not present

## 2021-05-28 DIAGNOSIS — Q828 Other specified congenital malformations of skin: Secondary | ICD-10-CM

## 2021-05-28 DIAGNOSIS — M778 Other enthesopathies, not elsewhere classified: Secondary | ICD-10-CM

## 2021-05-28 NOTE — Progress Notes (Addendum)
This patient presents to the office with painful callus on her left forefoot.  She says she stepped on a nail 1 year ago and a callus has formed.  She says the callus now throbs.by the end of her work day.    She says the callus is very painful.  She presents for evaluation and treatment.   Vascular  Dorsalis pedis and posterior tibial pulses are palpable  B/L.  Capillary return  WNL.  Temperature gradient is  WNL.  Skin turgor  WNL  Sensorium  Senn Weinstein monofilament wire  WNL. Normal tactile sensation.  Nail Exam  Patient has normal nails with no evidence of bacterial or fungal infection.  Orthopedic  Exam  Muscle tone and muscle strength  WNL.  No limitations of motion feet  B/L.  No crepitus or joint effusion noted.  Foot type is unremarkable and digits show no abnormalities.  Bony prominences are unremarkable.Palpable pain sub 4th met left foot.  Skin  No open lesions.  Normal skin texture and turgor. Porokeratosis sub 4th met callus left foot distal to the met head.  Porokeratosis left forefoot.  Capsulitis sub 4 left foot.  IE.  X-ray was taken to rule out  FB.  No FB noted on x-ray.  Debrided porokeratotic lesion left foot.  Gave gel dispersion sheet for this patient.  RTC prn.     Gardiner Barefoot DPM
